# Patient Record
Sex: Female | Born: 1980 | Race: Black or African American | Hispanic: No | Marital: Single | Smoking: Current some day smoker
Health system: Southern US, Community
[De-identification: ages and names within clinical notes are randomized; demographics above are authoritative.]

## PROBLEM LIST (undated history)

## (undated) ENCOUNTER — Inpatient Hospital Stay (HOSPITAL_COMMUNITY): Payer: Self-pay

## (undated) DIAGNOSIS — L508 Other urticaria: Secondary | ICD-10-CM

## (undated) DIAGNOSIS — F329 Major depressive disorder, single episode, unspecified: Secondary | ICD-10-CM

## (undated) DIAGNOSIS — Z8632 Personal history of gestational diabetes: Secondary | ICD-10-CM

## (undated) DIAGNOSIS — E282 Polycystic ovarian syndrome: Secondary | ICD-10-CM

## (undated) DIAGNOSIS — F32A Depression, unspecified: Secondary | ICD-10-CM

## (undated) DIAGNOSIS — R7303 Prediabetes: Secondary | ICD-10-CM

## (undated) DIAGNOSIS — Z87898 Personal history of other specified conditions: Secondary | ICD-10-CM

## (undated) DIAGNOSIS — IMO0002 Reserved for concepts with insufficient information to code with codable children: Secondary | ICD-10-CM

## (undated) DIAGNOSIS — J3089 Other allergic rhinitis: Secondary | ICD-10-CM

## (undated) DIAGNOSIS — N970 Female infertility associated with anovulation: Secondary | ICD-10-CM

## (undated) DIAGNOSIS — Z8759 Personal history of other complications of pregnancy, childbirth and the puerperium: Secondary | ICD-10-CM

## (undated) DIAGNOSIS — O139 Gestational [pregnancy-induced] hypertension without significant proteinuria, unspecified trimester: Secondary | ICD-10-CM

## (undated) DIAGNOSIS — Z8742 Personal history of other diseases of the female genital tract: Secondary | ICD-10-CM

## (undated) HISTORY — DX: Polycystic ovarian syndrome: E28.2

## (undated) HISTORY — PX: WISDOM TOOTH EXTRACTION: SHX21

## (undated) HISTORY — PX: COLPOSCOPY: SHX161

## (undated) HISTORY — DX: Reserved for concepts with insufficient information to code with codable children: IMO0002

## (undated) HISTORY — DX: Female infertility associated with anovulation: N97.0

## (undated) HISTORY — DX: Other allergic rhinitis: J30.89

---
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
## 2000-08-09 ENCOUNTER — Emergency Department (HOSPITAL_COMMUNITY): Admission: EM | Admit: 2000-08-09 | Discharge: 2000-08-09 | Payer: Self-pay | Admitting: *Deleted

## 2001-08-01 ENCOUNTER — Emergency Department (HOSPITAL_COMMUNITY): Admission: EM | Admit: 2001-08-01 | Discharge: 2001-08-01 | Payer: Self-pay | Admitting: Emergency Medicine

## 2004-11-09 ENCOUNTER — Emergency Department (HOSPITAL_COMMUNITY): Admission: EM | Admit: 2004-11-09 | Discharge: 2004-11-09 | Payer: Self-pay | Admitting: Emergency Medicine

## 2005-02-21 ENCOUNTER — Emergency Department (HOSPITAL_COMMUNITY): Admission: EM | Admit: 2005-02-21 | Discharge: 2005-02-21 | Payer: Self-pay | Admitting: Emergency Medicine

## 2005-03-02 ENCOUNTER — Emergency Department (HOSPITAL_COMMUNITY): Admission: EM | Admit: 2005-03-02 | Discharge: 2005-03-02 | Payer: Self-pay | Admitting: Emergency Medicine

## 2005-07-11 ENCOUNTER — Emergency Department (HOSPITAL_COMMUNITY): Admission: EM | Admit: 2005-07-11 | Discharge: 2005-07-11 | Payer: Self-pay | Admitting: Emergency Medicine

## 2006-01-30 ENCOUNTER — Emergency Department (HOSPITAL_COMMUNITY): Admission: EM | Admit: 2006-01-30 | Discharge: 2006-01-30 | Payer: Self-pay | Admitting: Emergency Medicine

## 2006-05-19 ENCOUNTER — Emergency Department (HOSPITAL_COMMUNITY): Admission: EM | Admit: 2006-05-19 | Discharge: 2006-05-19 | Payer: Self-pay | Admitting: Emergency Medicine

## 2006-08-10 ENCOUNTER — Inpatient Hospital Stay (HOSPITAL_COMMUNITY): Admission: AD | Admit: 2006-08-10 | Discharge: 2006-08-10 | Payer: Self-pay | Admitting: Gynecology

## 2006-09-16 ENCOUNTER — Ambulatory Visit (HOSPITAL_COMMUNITY): Admission: RE | Admit: 2006-09-16 | Discharge: 2006-09-16 | Payer: Self-pay | Admitting: Obstetrics & Gynecology

## 2006-12-27 ENCOUNTER — Emergency Department (HOSPITAL_COMMUNITY): Admission: EM | Admit: 2006-12-27 | Discharge: 2006-12-27 | Payer: Self-pay | Admitting: Emergency Medicine

## 2008-01-09 ENCOUNTER — Emergency Department (HOSPITAL_COMMUNITY): Admission: EM | Admit: 2008-01-09 | Discharge: 2008-01-09 | Payer: Self-pay | Admitting: Family Medicine

## 2008-11-05 ENCOUNTER — Emergency Department (HOSPITAL_COMMUNITY): Admission: EM | Admit: 2008-11-05 | Discharge: 2008-11-05 | Payer: Self-pay | Admitting: Emergency Medicine

## 2008-11-16 ENCOUNTER — Ambulatory Visit (HOSPITAL_COMMUNITY): Admission: RE | Admit: 2008-11-16 | Discharge: 2008-11-16 | Payer: Self-pay | Admitting: Obstetrics & Gynecology

## 2009-04-14 ENCOUNTER — Emergency Department (HOSPITAL_COMMUNITY): Admission: EM | Admit: 2009-04-14 | Discharge: 2009-04-14 | Payer: Self-pay | Admitting: Emergency Medicine

## 2009-05-01 ENCOUNTER — Encounter: Admission: RE | Admit: 2009-05-01 | Discharge: 2009-05-01 | Payer: Self-pay | Admitting: Chiropractic Medicine

## 2009-07-06 DIAGNOSIS — R87619 Unspecified abnormal cytological findings in specimens from cervix uteri: Secondary | ICD-10-CM

## 2009-07-06 DIAGNOSIS — IMO0002 Reserved for concepts with insufficient information to code with codable children: Secondary | ICD-10-CM

## 2009-07-06 HISTORY — DX: Reserved for concepts with insufficient information to code with codable children: IMO0002

## 2009-07-06 HISTORY — DX: Unspecified abnormal cytological findings in specimens from cervix uteri: R87.619

## 2009-07-08 ENCOUNTER — Inpatient Hospital Stay (HOSPITAL_COMMUNITY): Admission: AD | Admit: 2009-07-08 | Discharge: 2009-07-08 | Payer: Self-pay | Admitting: Obstetrics & Gynecology

## 2010-07-17 ENCOUNTER — Ambulatory Visit: Admit: 2010-07-17 | Payer: Self-pay | Admitting: Obstetrics and Gynecology

## 2010-07-26 ENCOUNTER — Encounter: Payer: Self-pay | Admitting: Family Medicine

## 2010-07-27 ENCOUNTER — Encounter: Payer: Self-pay | Admitting: Obstetrics & Gynecology

## 2010-09-03 ENCOUNTER — Encounter: Payer: Self-pay | Admitting: Obstetrics & Gynecology

## 2010-09-03 ENCOUNTER — Other Ambulatory Visit: Payer: Self-pay | Admitting: Family Medicine

## 2010-09-03 ENCOUNTER — Encounter (INDEPENDENT_AMBULATORY_CARE_PROVIDER_SITE_OTHER): Payer: Medicaid Other | Admitting: Family Medicine

## 2010-09-03 DIAGNOSIS — E282 Polycystic ovarian syndrome: Secondary | ICD-10-CM

## 2010-09-03 LAB — CONVERTED CEMR LAB
FSH: 8.4 milliintl units/mL
Hgb A1c MFr Bld: 6.2 % — ABNORMAL HIGH (ref <5.7)
LH: 34.9 milliintl units/mL
Prolactin: 7.5 ng/mL
TSH: 0.699 microintl units/mL (ref 0.350–4.500)
Testosterone Free: 32.3 pg/mL — ABNORMAL HIGH (ref 0.6–6.8)
Testosterone: 180.66 ng/dL — ABNORMAL HIGH (ref 10–70)

## 2010-09-11 ENCOUNTER — Ambulatory Visit (HOSPITAL_COMMUNITY)
Admission: RE | Admit: 2010-09-11 | Discharge: 2010-09-11 | Disposition: A | Discharge status: Home / Self Care | Payer: Medicaid Other | Source: Ambulatory Visit | Attending: Obstetrics and Gynecology | Admitting: Obstetrics and Gynecology

## 2010-09-11 DIAGNOSIS — E282 Polycystic ovarian syndrome: Secondary | ICD-10-CM | POA: Insufficient documentation

## 2010-09-11 DIAGNOSIS — N912 Amenorrhea, unspecified: Secondary | ICD-10-CM | POA: Insufficient documentation

## 2010-09-18 ENCOUNTER — Ambulatory Visit: Payer: Self-pay | Admitting: Obstetrics and Gynecology

## 2010-09-21 LAB — URINALYSIS, ROUTINE W REFLEX MICROSCOPIC
Bilirubin Urine: NEGATIVE
Hgb urine dipstick: NEGATIVE
Ketones, ur: NEGATIVE mg/dL
Protein, ur: NEGATIVE mg/dL
Urobilinogen, UA: 0.2 mg/dL (ref 0.0–1.0)
pH: 8 (ref 5.0–8.0)

## 2010-09-21 LAB — WET PREP, GENITAL
Clue Cells Wet Prep HPF POC: NONE SEEN
Trich, Wet Prep: NONE SEEN
Yeast Wet Prep HPF POC: NONE SEEN

## 2010-09-21 LAB — GC/CHLAMYDIA PROBE AMP, GENITAL
Chlamydia, DNA Probe: NEGATIVE
GC Probe Amp, Genital: NEGATIVE

## 2010-09-26 NOTE — Progress Notes (Signed)
NAME:  Deanna Walker, Deanna Walker NO.:  [id number]  MEDICAL RECORD NO.:  [id number]           PATIENT TYPE:  A  LOCATION:  [facility]                   FACILITY:  [facility]  PHYSICIAN:  Lucina Mellow, DO   DATE OF BIRTH:  December 19, 1980  DATE OF SERVICE:  09/03/2010                                 CLINIC NOTE  The patient presents to the GYN Clinic on the afternoon of September 03, 2010.  The patient's chief complaint/reason for visit is she is referred from [facility] for probable ovarian cyst.  She reports irregular periods and gives a history that she has to be on pills to get her periods.  HISTORY OF PRESENT ILLNESS:  The patient is a 30 year old, gravida 0, who presents for regular periods and desiring pregnancy.  She stopped taking her birth control pills in October 2011 and has not had a period since and she is currently sexually active trying to get pregnant and does not use any condoms or other forms.  She states that she has a history of irregular periods since she was 17.  She has tried several birth control methods, including Depo, OCPs, and the patches and only has monthly periods while on birth control pills.  She notes she has also noticed an increase in her fatigue, facial hair, and weight gain for the past 3 years.  Last year, she had an abnormal Pap with colpo and biopsy, but since then had normal paps.  In October 2011, she had a normal Pap and negative gonorrhea and Chlamydia testing.  She has a very remote history of gonorrhea.  She has a history of 2 normal vaginal ultrasounds in 2011.  PAST MEDICAL HISTORY:  Menarche at age 11.  She has no medical problems. No history of surgeries.  SOCIAL HISTORY:  She admits to use of THC.  PHYSICAL EXAMINATION:  VITAL SIGNS:  The patient has a blood pressure 127/61, pulse of 76, temperature of 98.4, weight of 179.3, and height of 65 inches. GENERAL:  The patient is a pleasant-appearing African  American female who looks her stated age of 30 years old. HEART:  Regular rate and rhythm.  No audible murmurs. LUNGS:  Clear to auscultation bilaterally. NECK:  Thyroid is palpated, but not enlarged or asymmetric with no nodules. ABDOMEN:  Benign. PELVIC:  Deferred because the patient states she just had a routine pelvic exam that was completely normal in October 2011.  ASSESSMENT:  Probable polycystic ovarian syndrome.  I discussed with the patient that because of her facial hair and her irregular periods she needs to treat the true cause for polycystic ovarian syndrome.  I discussed with her that the reason why she may not been able to get pregnant because she is simply not ovulating due to the PCOS.  We will get lab testing today, FSH, LH, prolactin, TSH, hemoglobin A1c, and testosterone to determine if there is a hormonal abnormality that needs to be addressed as the reason for her disorder or her lack of fertility. She is not considered infertile until she has not been able to conceive for 12 months.  I discussed with her also that she  may need to discuss with her partner about ensuring that he is fertile.  The patient states that her partner has a 62 year old and an 40 year old.  We will also get an ultrasound to evaluate her uterus and ovaries to ensure that her anatomy is normal and that see there is any further evidence to support the diagnosis of PCOS.  The patient is to return to the clinic in approximately 2 weeks to discuss her lab results at that time. The patient agrees with this plan.  Questions were answered and she voices all understanding.          ______________________________ Lucina Mellow, DO    SH/MEDQ  D:  09/03/2010  T:  09/04/2010  Job:  [id number]

## 2010-10-02 ENCOUNTER — Ambulatory Visit (INDEPENDENT_AMBULATORY_CARE_PROVIDER_SITE_OTHER): Payer: Medicaid Other | Admitting: Obstetrics and Gynecology

## 2010-10-02 DIAGNOSIS — E282 Polycystic ovarian syndrome: Secondary | ICD-10-CM

## 2010-10-03 NOTE — Progress Notes (Unsigned)
NAME:  Deanna Walker, DELLER NO.:  [id number]  MEDICAL RECORD NO.:  [id number]           PATIENT TYPE:  A  [facility]ATION[facility]                   FACILITY:  [facility]  PHYSICIAN:  Argentina Donovan, MD        DATE OF BIRTH:  05/03/1981  DATE OF SERVICE:  10/02/2010                                 CLINIC NOTE  The patient is a 30 year old, nulligravid, African American female, with polycystic ovarian syndrome, who was last seen by Dr. Natale Milch.  She got lab tests on the patient.  The patient does have amenorrhea, hirsutism, had elevated free testosterone and elevated hemoglobin A1c.  We are going to start her on metformin to twice a day 500 mg with meals and Lo Loestrin 24 Fe on a regular basis.  We have talked about pregnancy.  We have talked about contraception.  We have talked about hair growth.  We have covered most of the things that involve the polycystic ovarian syndrome.  By the way, this patient also had multiple subcentimeter peripherally oriented follicles on her ovaries, so we are going to put her on this medication and we are going to have her come back in about 3 months and see how she is doing.  IMPRESSION:  Polycystic ovarian syndrome.  PLAN:  Treat with metformin and oral contraceptives.          ______________________________ Argentina Donovan, MD    PR/MEDQ  D:  10/02/2010  T:  10/03/2010  Job:  [id number]

## 2010-10-09 LAB — POCT PREGNANCY, URINE: Preg Test, Ur: NEGATIVE

## 2010-10-14 ENCOUNTER — Inpatient Hospital Stay (INDEPENDENT_AMBULATORY_CARE_PROVIDER_SITE_OTHER)
Admission: RE | Admit: 2010-10-14 | Discharge: 2010-10-14 | Disposition: A | Discharge status: Home / Self Care | Payer: Medicaid Other | Source: Ambulatory Visit | Attending: Family Medicine | Admitting: Family Medicine

## 2010-10-14 DIAGNOSIS — L0390 Cellulitis, unspecified: Secondary | ICD-10-CM

## 2010-10-14 LAB — URINALYSIS, ROUTINE W REFLEX MICROSCOPIC
Protein, ur: NEGATIVE mg/dL
Specific Gravity, Urine: 1.007 (ref 1.005–1.030)

## 2010-10-14 LAB — WET PREP, GENITAL
WBC, Wet Prep HPF POC: NONE SEEN
Yeast Wet Prep HPF POC: NONE SEEN

## 2010-10-14 LAB — GC/CHLAMYDIA PROBE AMP, GENITAL: Chlamydia, DNA Probe: NEGATIVE

## 2010-12-10 ENCOUNTER — Ambulatory Visit: Payer: Medicaid Other | Admitting: Obstetrics and Gynecology

## 2010-12-15 ENCOUNTER — Other Ambulatory Visit: Payer: Self-pay | Admitting: Obstetrics and Gynecology

## 2010-12-15 ENCOUNTER — Ambulatory Visit (INDEPENDENT_AMBULATORY_CARE_PROVIDER_SITE_OTHER): Payer: Medicaid Other | Admitting: Obstetrics and Gynecology

## 2010-12-15 DIAGNOSIS — E282 Polycystic ovarian syndrome: Secondary | ICD-10-CM

## 2010-12-15 LAB — POCT PREGNANCY, URINE: Preg Test, Ur: NEGATIVE

## 2010-12-16 NOTE — Group Therapy Note (Signed)
NAME:  Deanna Walker, Deanna Walker NO.:  [id number]  MEDICAL RECORD NO.:  [id number]           PATIENT TYPE:  A  LOCATION[facility]                   [facility]ACILITY:  [facility]  PHYSICIAN:  Argentina Donovan, MD        DATE OF BIRTH:  1981/06/15  DATE OF SERVICE:  12/15/2010                                 CLINIC NOTE  Follow-up visit on a patient, who is a 30 year old, nulligravida, African American female, with polycystic ovarian syndrome, who was on 500 mg of Glucophage b.i.d., is starting to have regular periods.  We will going to keep her on that another month if she does not have regular periods, I will put her on 1000 into the evening.  She does weigh 172 pounds at the present time, down from 179.  We started her on the Glucophage and does not appear overweight, but mainly belly fat. She does have some hirsutism, which she is trying to get pregnant, so she is not on birth control pills at the present time.  I think that she is doing the right thing right now and would discontinue on with a treatment as such.  I have told her to call us for an increase to 1000 of Glucophage with her evening meal if periods are not regular.          ______________________________ Argentina Donovan, MD    PR/MEDQ  D:  12/15/2010  T:  12/16/2010  Job:  [id number]

## 2011-01-12 ENCOUNTER — Telehealth: Payer: Self-pay

## 2011-01-12 NOTE — Telephone Encounter (Signed)
Pt stated that in her last visit she was told that she would get an increase in her Metformin.  Reading the visit note from 12/15/10 per Dr. Okey Dupre, if the pt did not receive regular periods then that is when she would get an increase to 1000mg  Metformin.  I asked pt if she was having regular periods and pt stated "yes, she is having periods once a month" so she would not need the increase of Metformin.  Pt stated understanding.

## 2011-02-11 ENCOUNTER — Encounter: Payer: Self-pay | Admitting: *Deleted

## 2011-02-11 DIAGNOSIS — E282 Polycystic ovarian syndrome: Secondary | ICD-10-CM | POA: Insufficient documentation

## 2011-02-12 ENCOUNTER — Ambulatory Visit: Payer: Medicaid Other | Admitting: Obstetrics and Gynecology

## 2011-02-24 ENCOUNTER — Telehealth: Payer: Self-pay | Admitting: *Deleted

## 2011-02-24 NOTE — Telephone Encounter (Signed)
Pt left message that she had questions for the doctor

## 2011-02-27 NOTE — Telephone Encounter (Signed)
Spoke w/pt. She stated that she has not had a period since her last clinic visit. She has started the metformin and took the birth control pills for the 2 wks that were recommended. I told pt I did not think she was only supposed to take birth control pills for 2 wks because that would not be a full cycle of hormone pills. Pt stated that was her understanding. Pt has next appt on 03/13/11 for follow up. I told pt that there is no harm in her not having a period every month and that because she has PCOS, this is why. It is important however to try to re-boot her menstrual cycle and she will need to discuss further @ the time of her visit. Pt voiced understanding.

## 2011-03-13 ENCOUNTER — Encounter: Payer: Self-pay | Admitting: Obstetrics and Gynecology

## 2011-03-13 ENCOUNTER — Ambulatory Visit (INDEPENDENT_AMBULATORY_CARE_PROVIDER_SITE_OTHER): Payer: Medicaid Other | Admitting: Obstetrics and Gynecology

## 2011-03-13 VITALS — BP 122/83 | HR 73 | Temp 98.4°F | Ht 65.0 in | Wt 176.5 lb

## 2011-03-13 DIAGNOSIS — N910 Primary amenorrhea: Secondary | ICD-10-CM

## 2011-03-13 DIAGNOSIS — N949 Unspecified condition associated with female genital organs and menstrual cycle: Secondary | ICD-10-CM

## 2011-03-13 DIAGNOSIS — N979 Female infertility, unspecified: Secondary | ICD-10-CM

## 2011-03-13 LAB — POCT URINALYSIS DIP (DEVICE)
Specific Gravity, Urine: 1.015 (ref 1.005–1.030)
pH: 8 (ref 5.0–8.0)

## 2011-03-13 LAB — POCT PREGNANCY, URINE: Preg Test, Ur: NEGATIVE

## 2011-03-13 MED ORDER — MEDROXYPROGESTERONE ACETATE 10 MG PO TABS: 10.0000 mg | ORAL_TABLET | Freq: Every day | ORAL | Status: DC

## 2011-03-16 NOTE — Progress Notes (Signed)
Date of visit is 03/13/2011  Chief complaint: Patient presents today complaining of no menses for 2 months.  History of present illness: Shortness today complaining of no menses x2 months. She states she has been trying to get pregnant. She denies abdominal pain, vaginal discharge, vaginal bleeding, or any other problems at this time.  Physical examination: Patient presents alert and oriented x3 in no apparent distress. Her urine pregnancy test was negative. On examination her abdomen is soft and nontender to palpation. Bowel sounds are present. She has normal external female genitalia including bulbourethral and Skene's glands. Bimanual examination reveals her uterus to be normal size and shape. There no adnexal masses.  Assessment and plan: Patient has a history of PCO S. She is likely anovulatory. I did give her prescription for Provera 10 mg to take 1 by mouth daily for 10 days. She will followup in one month. She is aware that she should have menses within about 2 weeks of taking the medication. She understood this and agreed.  Clinton Gallant. Onyinyechi Huante III, DrHSc, MPAS, PA-C

## 2011-04-02 LAB — POCT URINALYSIS DIP (DEVICE)
Bilirubin Urine: NEGATIVE
Hgb urine dipstick: NEGATIVE
Ketones, ur: NEGATIVE
Nitrite: NEGATIVE
Protein, ur: NEGATIVE
Specific Gravity, Urine: 1.02
Urobilinogen, UA: 0.2
pH: 6

## 2011-04-02 LAB — POCT PREGNANCY, URINE: Preg Test, Ur: NEGATIVE

## 2011-04-05 ENCOUNTER — Inpatient Hospital Stay (HOSPITAL_COMMUNITY)
Admission: AD | Admit: 2011-04-05 | Discharge: 2011-04-05 | Disposition: A | Discharge status: Home / Self Care | Payer: Medicaid Other | Source: Ambulatory Visit | Attending: Obstetrics & Gynecology | Admitting: Obstetrics & Gynecology

## 2011-04-05 ENCOUNTER — Encounter (HOSPITAL_COMMUNITY): Payer: Self-pay | Admitting: *Deleted

## 2011-04-05 DIAGNOSIS — R1013 Epigastric pain: Secondary | ICD-10-CM

## 2011-04-05 DIAGNOSIS — R109 Unspecified abdominal pain: Secondary | ICD-10-CM | POA: Insufficient documentation

## 2011-04-05 DIAGNOSIS — L02429 Furuncle of limb, unspecified: Secondary | ICD-10-CM | POA: Insufficient documentation

## 2011-04-05 HISTORY — DX: Depression, unspecified: F32.A

## 2011-04-05 HISTORY — DX: Major depressive disorder, single episode, unspecified: F32.9

## 2011-04-05 LAB — COMPREHENSIVE METABOLIC PANEL
ALT: 14 U/L (ref 0–35)
AST: 16 U/L (ref 0–37)
Albumin: 3.9 g/dL (ref 3.5–5.2)
Alkaline Phosphatase: 78 U/L (ref 39–117)
Chloride: 101 mEq/L (ref 96–112)
Potassium: 3.9 mEq/L (ref 3.5–5.1)
Sodium: 138 mEq/L (ref 135–145)
Total Protein: 8 g/dL (ref 6.0–8.3)

## 2011-04-05 LAB — URINALYSIS, ROUTINE W REFLEX MICROSCOPIC
Glucose, UA: NEGATIVE mg/dL
Ketones, ur: NEGATIVE mg/dL
Leukocytes, UA: NEGATIVE
pH: 6 (ref 5.0–8.0)

## 2011-04-05 LAB — URINE MICROSCOPIC-ADD ON

## 2011-04-05 LAB — CBC
MCHC: 33.1 g/dL (ref 30.0–36.0)
RDW: 13.9 % (ref 11.5–15.5)
WBC: 10.5 10*3/uL (ref 4.0–10.5)

## 2011-04-05 LAB — POCT PREGNANCY, URINE: Preg Test, Ur: NEGATIVE

## 2011-04-05 MED ORDER — IBUPROFEN 600 MG PO TABS
600.0000 mg | ORAL_TABLET | Freq: Once | ORAL | Status: AC
  Administered 2011-04-05: 600 mg via ORAL
  Filled 2011-04-05: qty 1

## 2011-04-05 MED ORDER — IBUPROFEN 600 MG PO TABS: 600.0000 mg | ORAL_TABLET | Freq: Four times a day (QID) | ORAL | Status: AC | PRN

## 2011-04-05 NOTE — ED Provider Notes (Signed)
History   The patient is a 30 y.o. year old G0P0 female at who presents to [facility] reporting: 1. Boil under her right arm 2. Long period   CSN: [id number] Arrival date & time: 04/05/2011  3:39 PM  Chief Complaint  Patient presents with  . Recurrent Skin Infections    abscess in R axilla  . Abdominal Pain    starts mid upper abd, radiates down through vagina    (Consider location/radiation/quality/duration/timing/severity/associated sxs/prior treatment) HPI  Past Medical History  Diagnosis Date  . Abnormal Pap smear 2011    colpo and biopsy done, normal paps since  . PCOS (polycystic ovarian syndrome)   . Boil   . Boil, axilla   . Boil of buttock   . Depression     Past Surgical History  Procedure Date  . Abcess drainage   . Colposcopy     Family History  Problem Relation Age of Onset  . Diabetes Maternal Aunt   . Hypertension Maternal Aunt     History  Substance Use Topics  . Smoking status: Never Smoker   . Smokeless tobacco: Never Used  . Alcohol Use: 0.6 oz/week    1 Glasses of wine per week    OB History    Grav Para Term Preterm Abortions TAB SAB Ect Mult Living   0 0              Review of Systems: Denies fever, chills, dizziness  Allergies  Review of patient's allergies indicates no known allergies.  Home Medications  No current outpatient prescriptions on file.  LMP 03/29/2011  Physical Exam:  2-3 cm firm, non-fluctuant mass in right axilla. No erythema.  Scant drainage of serosanguinous fluid.  Pelvic: Mod BRB. No clots or cervical polyps. Uterus difficult to assess due to body habitus  ED Course  Procedures (including critical care time) Results for Deanna, Walker (MRN [id number]) as of 04/10/2011 00:51  Ref. Range 04/05/2011 15:55 04/05/2011 15:58 04/05/2011 17:50  Glucose Latest Range: 70-99 mg/dL   90  Alkaline Phosphatase Latest Range: 39-117 U/L   78  Albumin Latest Range: 3.5-5.2 g/dL   3.9  AST Latest Range: 0-37 U/L   16  ALT  Latest Range: 0-35 U/L   14  Total Protein Latest Range: 6.0-8.3 g/dL   8.0  Total Bilirubin Latest Range: 0.3-1.2 mg/dL   0.2 (L)  WBC Latest Range: 4.0-10.5 K/uL   10.5  RBC Latest Range: 3.87-5.11 MIL/uL   4.84  HGB Latest Range: 12.0-15.0 g/dL   78.2  HCT Latest Range: 36.0-46.0 %   38.4  MCV Latest Range: 78.0-100.0 fL   79.3  MCH Latest Range: 26.0-34.0 pg   26.2  MCHC Latest Range: 30.0-36.0 g/dL   95.6  RDW Latest Range: 11.5-15.5 %   13.9  Platelets Latest Range: 150-400 K/uL   362  Preg Test, Ur No range found  NEGATIVE   Color, Urine Latest Range: YELLOW  YELLOW    Appearance Latest Range: CLEAR  CLEAR    Specific Gravity, Urine Latest Range: 1.005-1.030  1.010    pH Latest Range: 5.0-8.0  6.0    Glucose, UA Latest Range: NEGATIVE mg/dL NEGATIVE    Bilirubin Urine Latest Range: NEGATIVE  NEGATIVE    Ketones, ur Latest Range: NEGATIVE mg/dL NEGATIVE    Protein Latest Range: NEGATIVE mg/dL NEGATIVE    Urobilinogen, UA Latest Range: 0.0-1.0 mg/dL 0.2    Nitrite Latest Range: NEGATIVE  NEGATIVE    Leukocytes, UA Latest  Range: NEGATIVE  NEGATIVE    RBC / HPF Latest Range: <3 RBC/hpf 3-6    Squamous Epithelial / LPF Latest Range: RARE  RARE    Bacteria, UA Latest Range: RARE  RARE    Hgb urine dipstick Latest Range: NEGATIVE  LARGE (A)     Assessment: 1. Inclusion cyst 2. Irreg menses due to PCOS and recent use of Provera to initiate withdrawal bleed.    Plan: 1. Soaks to right axilla 5x/day 2. Rx Ibuprofen 600 mg PO Q6 x 48 hours 3. F/U AS at [facility] G[facility] clinic AS 04/15/11 or PRN for soaking >1pad per hour or dizziness.  Dorathy Kinsman 04/05/2011 20:04 PM

## 2011-04-05 NOTE — Progress Notes (Signed)
Pt signed in with c/o axillary abscess.  Upon initial assessment, pt did not mention abscess but states concern over irreg periods and period longer than normal at this time.  Has been f/u at [facility], was placed on Metformin 2-3 months ago, and Provera to initiate period a few weeks ago.  Period started 9/23, but states is bleeding heavier and longer than she thinks she should be.  States concern over abd pain that has occurred intermittently, clinic told pt it was "nothing" but pt feels something is wrong.  Pain begins with a "wriggling feeling" in upper abd that tells her the pain is coming, and then it radiates down through vagina and lasts up to a minute.  Does not seem to be aggravated by food intake or constipation, states has BM daily.

## 2011-04-05 NOTE — Progress Notes (Signed)
Boil under right armpit x 3 days more swollen and painful today, right eye jerking.

## 2011-04-15 NOTE — ED Provider Notes (Signed)
Agree with above note.  Deanna Walker H. 04/15/2011 9:35 PM

## 2011-04-22 LAB — CBC
HCT: 40.9
Hemoglobin: 13.3
MCV: 79.6
RDW: 14.2 — ABNORMAL HIGH

## 2011-04-22 LAB — BASIC METABOLIC PANEL
CO2: 28
Chloride: 101
GFR calc non Af Amer: >60
Glucose, Bld: 80
Potassium: 3.8
Sodium: 138

## 2011-04-22 LAB — DIFFERENTIAL
Basophils Absolute: 0
Eosinophils Absolute: 0.1
Eosinophils Relative: 1
Lymphocytes Relative: 31
Monocytes Absolute: 0.6

## 2011-04-24 ENCOUNTER — Ambulatory Visit (INDEPENDENT_AMBULATORY_CARE_PROVIDER_SITE_OTHER): Payer: Medicaid Other | Admitting: Obstetrics and Gynecology

## 2011-04-24 VITALS — BP 118/69 | HR 72 | Temp 97.2°F | Wt 178.1 lb

## 2011-04-24 DIAGNOSIS — E282 Polycystic ovarian syndrome: Secondary | ICD-10-CM

## 2011-04-24 MED ORDER — MEDROXYPROGESTERONE ACETATE 10 MG PO TABS: 10.0000 mg | ORAL_TABLET | Freq: Every day | ORAL | Status: DC

## 2011-04-24 MED ORDER — METFORMIN HCL 500 MG PO TABS: 1000.0000 mg | ORAL_TABLET | Freq: Two times a day (BID) | ORAL | Status: DC

## 2011-04-24 NOTE — Progress Notes (Signed)
S: Pt presents today for f/u of delayed menses. She was given an Rx for provera. She states she did have withdrawal bleeding that began on 9/23 and lasted for about 8 days. She denies abd pain, vag bleeding, dc, fever, or any other problems at this time. She states she desires pregnancy. She is currently taking metformin 500mg  bid.  O: VSS afebrile. A&Ox3 in NAD Abd soft, nontender. A/P: PCOS: discussed with pt at length. Will increase her metformin to 1000mg  bid. She will also be given another Rx for provera to begin on 05/07/11 if she has not had menses by that time. She will return in 2 months for evaluation. Discussed diet, activity, risks, and precautions.  Clinton Gallant. Rice III, DrHSc, MPAS, PA-C

## 2011-05-12 ENCOUNTER — Telehealth: Payer: Self-pay | Admitting: *Deleted

## 2011-05-12 MED ORDER — METFORMIN HCL 850 MG PO TABS: 850.0000 mg | ORAL_TABLET | Freq: Two times a day (BID) | ORAL | Status: DC

## 2011-05-12 NOTE — Telephone Encounter (Signed)
Pt did not give the reason for her call.

## 2011-05-12 NOTE — Telephone Encounter (Signed)
Pt left new message @ 1110 stating that she had lost her Rx and would like a call back. I called pt and told her that I can call in the meds per the original order. I received a flag on the metformin dosage entered by Madaline Guthrie on 10/19 and then called to discuss w/Dr. Macon Large. She changed the dose to metformin 850 mg po BID. Pt was informed and voiced understanding. Meds called to pt's pharmacy.

## 2011-06-11 ENCOUNTER — Emergency Department (HOSPITAL_COMMUNITY)
Admission: EM | Admit: 2011-06-11 | Discharge: 2011-06-11 | Disposition: A | Discharge status: Home / Self Care | Payer: Medicaid Other | Attending: Emergency Medicine | Admitting: Emergency Medicine

## 2011-06-11 ENCOUNTER — Encounter (HOSPITAL_COMMUNITY): Payer: Self-pay

## 2011-06-11 DIAGNOSIS — Z79899 Other long term (current) drug therapy: Secondary | ICD-10-CM | POA: Insufficient documentation

## 2011-06-11 DIAGNOSIS — E119 Type 2 diabetes mellitus without complications: Secondary | ICD-10-CM | POA: Insufficient documentation

## 2011-06-11 DIAGNOSIS — F3289 Other specified depressive episodes: Secondary | ICD-10-CM | POA: Insufficient documentation

## 2011-06-11 DIAGNOSIS — F329 Major depressive disorder, single episode, unspecified: Secondary | ICD-10-CM | POA: Insufficient documentation

## 2011-06-11 DIAGNOSIS — L301 Dyshidrosis [pompholyx]: Secondary | ICD-10-CM | POA: Insufficient documentation

## 2011-06-11 DIAGNOSIS — L299 Pruritus, unspecified: Secondary | ICD-10-CM | POA: Insufficient documentation

## 2011-06-11 MED ORDER — TRIAMCINOLONE ACETONIDE 0.1 % EX CREA: TOPICAL_CREAM | Freq: Two times a day (BID) | CUTANEOUS | Status: DC

## 2011-06-11 NOTE — ED Provider Notes (Signed)
History     CSN: [id number] Arrival date & time: 06/11/2011  3:43 PM   None     Chief Complaint  Patient presents with  . Rash    (Consider location/radiation/quality/duration/timing/severity/associated sxs/prior treatment) HPI  Patient presents to ER complaining of a 2 day hx of small itchy bumps on the tops of her hands and feet. Symptoms were gradual onset and persistent. Denies rash elsewhere and denies family members having similar rash. States itching but denies redness, drainage or swelling of skin. Denies new skin products or detergents. Denies aggravating or alleviating factors. Has not taken anything for itch before arrival.   Past Medical History  Diagnosis Date  . Abnormal Pap smear 2011    colpo and biopsy done, normal paps since  . PCOS (polycystic ovarian syndrome)   . Boil   . Boil, axilla   . Boil of buttock   . Depression   . Diabetes mellitus     Past Surgical History  Procedure Date  . Abcess drainage   . Colposcopy     Family History  Problem Relation Age of Onset  . Diabetes Maternal Aunt   . Hypertension Maternal Aunt     History  Substance Use Topics  . Smoking status: Never Smoker   . Smokeless tobacco: Never Used  . Alcohol Use: 0.6 oz/week    1 Glasses of wine per week    OB History    Grav Para Term Preterm Abortions TAB SAB Ect Mult Living   0 0              Review of Systems  All other systems reviewed and are negative.    Allergies  Review of patient's allergies indicates no known allergies.  Home Medications   Current Outpatient Rx  Name Route Sig Dispense Refill  . METFORMIN HCL 850 MG PO TABS Oral Take 1 tablet (850 mg total) by mouth 2 (two) times daily with a meal. 60 tablet 3  . THERA M PLUS PO TABS Oral Take 1 tablet by mouth daily.      Marland Kitchen MEDROXYPROGESTERONE ACETATE 10 MG PO TABS Oral Take 1 tablet (10 mg total) by mouth daily. 10 tablet 3  . TRIAMCINOLONE ACETONIDE 0.1 % EX CREA Topical Apply topically 2  (two) times daily. 30 g 0    BP 112/70  Pulse 82  Temp(Src) 98.1 F (36.7 C) (Oral)  Resp 16  SpO2 100%  LMP 06/06/2011  Physical Exam  Nursing note and vitals reviewed. Constitutional: She appears well-developed and well-nourished.  HENT:  Head: Normocephalic and atraumatic.  Eyes: Conjunctivae are normal.  Neck: Neck supple.  Cardiovascular: Normal rate.   Pulmonary/Chest: Effort normal.  Musculoskeletal: Normal range of motion.  Skin: Skin is warm and dry.       Pin point scattered skin colored papules on dorsal hands and feet. No erythema or drainage. No fluctuance.     ED Course  Procedures (including critical care time)  Labs Reviewed - No data to display No results found.   1. Dyshidrotic eczema       MDM  No signs or symptoms of cellulitis. Rash not consistent with scabies without tracking and rash only on hands and feet.         Jenness Corner, PA 06/11/11 1721  Jenness Corner, [place] 06/11/11 2131

## 2011-06-11 NOTE — ED Notes (Signed)
Pt states that she began noticing bumps on her hands/arms/feet about 2 days ago.  States they are itchy and painful.

## 2011-06-12 NOTE — ED Provider Notes (Signed)
Medical screening examination/treatment/procedure(s) were performed by non-physician practitioner and as supervising physician I was immediately available for consultation/collaboration.  Maribelle Hopple, MD 06/12/11 0204 

## 2011-06-26 ENCOUNTER — Ambulatory Visit: Payer: Medicaid Other | Admitting: Obstetrics and Gynecology

## 2011-08-07 ENCOUNTER — Encounter: Payer: Self-pay | Admitting: Obstetrics and Gynecology

## 2011-08-07 ENCOUNTER — Ambulatory Visit (INDEPENDENT_AMBULATORY_CARE_PROVIDER_SITE_OTHER): Payer: Medicaid Other | Admitting: Obstetrics and Gynecology

## 2011-08-07 ENCOUNTER — Other Ambulatory Visit: Payer: Self-pay | Admitting: Obstetrics and Gynecology

## 2011-08-07 VITALS — BP 120/70 | HR 86 | Temp 98.1°F | Ht 65.0 in | Wt 179.2 lb

## 2011-08-07 DIAGNOSIS — N949 Unspecified condition associated with female genital organs and menstrual cycle: Secondary | ICD-10-CM

## 2011-08-07 NOTE — Progress Notes (Signed)
S: Pt presents today to f/u on PCOS. She states she has had menses the past several months. On her last visit, her metformin dosage was increased to 850mg  twice daily. She states she did have a short menses in December and January. Her only concern now is that she continues to have intermittent sharp abd pain. She is unable to relate the pain to a certain time period or activities. She denies any other sx at this time. O: VSS A&O x 3 in NAD Abd soft, non-tender NL external genitalia. Bimanual exam reveals uterus to be NL size and shape with no obvious adnexal masses. Pt non-tender on exam. A/P: PCOS/pelvic pain: pt seems to be having NL menses on the current dose of metformin so she will continue. Will get [place] to r/o other etiology for her pain. Discussed diet, activity, risks, and precautions. She will f/u prn. Also recommended that she schedule an appt for yearly pap and pe.  Clinton Gallant. Nero Sawatzky III, DrHSc, MPAS, PA-C

## 2011-08-11 ENCOUNTER — Ambulatory Visit (HOSPITAL_COMMUNITY): Payer: Medicaid Other

## 2011-08-13 ENCOUNTER — Ambulatory Visit (HOSPITAL_COMMUNITY)
Admission: RE | Admit: 2011-08-13 | Discharge: 2011-08-13 | Disposition: A | Discharge status: Home / Self Care | Payer: Medicaid Other | Source: Ambulatory Visit | Attending: Obstetrics and Gynecology | Admitting: Obstetrics and Gynecology

## 2011-08-13 DIAGNOSIS — E282 Polycystic ovarian syndrome: Secondary | ICD-10-CM | POA: Insufficient documentation

## 2011-08-13 DIAGNOSIS — N949 Unspecified condition associated with female genital organs and menstrual cycle: Secondary | ICD-10-CM | POA: Insufficient documentation

## 2011-09-09 ENCOUNTER — Encounter: Payer: Self-pay | Admitting: Obstetrics & Gynecology

## 2011-09-09 ENCOUNTER — Ambulatory Visit (INDEPENDENT_AMBULATORY_CARE_PROVIDER_SITE_OTHER): Payer: Medicaid Other | Admitting: Obstetrics & Gynecology

## 2011-09-09 VITALS — BP 129/77 | HR 83 | Temp 97.4°F | Ht 65.5 in | Wt 179.9 lb

## 2011-09-09 DIAGNOSIS — R351 Nocturia: Secondary | ICD-10-CM

## 2011-09-09 DIAGNOSIS — N949 Unspecified condition associated with female genital organs and menstrual cycle: Secondary | ICD-10-CM

## 2011-09-09 DIAGNOSIS — R102 Pelvic and perineal pain: Secondary | ICD-10-CM

## 2011-09-09 LAB — POCT URINALYSIS DIP (DEVICE)
Bilirubin Urine: NEGATIVE
Glucose, UA: NEGATIVE mg/dL
Leukocytes, UA: NEGATIVE
Nitrite: NEGATIVE

## 2011-09-09 NOTE — Progress Notes (Signed)
  Subjective:    Patient ID: Deanna Walker, female    DOB: February 27, 1981, 31 y.o.   MRN: [id number]  HPI  Deanna Walker is here today for her sypmtom of vaginal pain that "shoots up" into her pelvis. There are no provocating events. She denies dysparunia. She does have a distant history of gc and chlamydia. Her pelvic ultrasound done recently was normal with the exception of polycystic ovaries. She also complains of getting up at least 4-5 times every night to pee.   Review of Systems She drinks 1-2 liters of soda each day.    Objective:   Physical Exam        Assessment & Plan:  Pelvic pain/nocturia- I suspect interstitial cystitis. I am checking UA, U C&S, and GC and Chlamydia. I have told her NO MORE soda or caffeine. She will come back in 1 month for follow up.

## 2011-09-10 LAB — URINALYSIS
Bilirubin Urine: NEGATIVE
Glucose, UA: NEGATIVE mg/dL
Hgb urine dipstick: NEGATIVE
Ketones, ur: NEGATIVE mg/dL
Leukocytes, UA: NEGATIVE
Nitrite: NEGATIVE
Protein, ur: NEGATIVE mg/dL
Specific Gravity, Urine: 1.016 (ref 1.005–1.030)
Urobilinogen, UA: 0.2 mg/dL (ref 0.0–1.0)
pH: 7 (ref 5.0–8.0)

## 2011-09-10 LAB — GC/CHLAMYDIA PROBE AMP, URINE: Chlamydia, Swab/Urine, PCR: NEGATIVE

## 2011-10-14 ENCOUNTER — Ambulatory Visit: Payer: Medicaid Other | Admitting: Advanced Practice Midwife

## 2011-10-15 ENCOUNTER — Encounter: Payer: Self-pay | Admitting: Obstetrics & Gynecology

## 2011-10-15 ENCOUNTER — Ambulatory Visit (INDEPENDENT_AMBULATORY_CARE_PROVIDER_SITE_OTHER): Payer: Medicaid Other | Admitting: Obstetrics & Gynecology

## 2011-10-15 VITALS — BP 124/76 | HR 76 | Temp 97.1°F | Ht 65.0 in | Wt 177.4 lb

## 2011-10-15 DIAGNOSIS — R102 Pelvic and perineal pain: Secondary | ICD-10-CM

## 2011-10-15 DIAGNOSIS — N949 Unspecified condition associated with female genital organs and menstrual cycle: Secondary | ICD-10-CM

## 2011-10-15 NOTE — Progress Notes (Signed)
Mrs. Snoke is here for follow up of her labs as well as her continued "shooting" vaginal/pelvic pain. Again, no palliation or provocation. Her u/s is normal as are all of her labs.  Because I can find no gyn etiology for her symptoms, I have suggested that perhaps this is a pain that she can learn to live with. She is not content with that answer and so I am referring her to a urologist.

## 2011-10-15 NOTE — Progress Notes (Signed)
Referral to urologist made with patient. [facility] on 11/27/11 @ 1430. Patient agrees.

## 2011-10-15 NOTE — Progress Notes (Signed)
Pt c/o Metformin making her very tired and has to lay down.

## 2012-02-12 ENCOUNTER — Encounter (HOSPITAL_COMMUNITY): Payer: Self-pay | Admitting: *Deleted

## 2012-02-12 ENCOUNTER — Encounter (HOSPITAL_COMMUNITY): Payer: Self-pay | Admitting: Emergency Medicine

## 2012-02-12 ENCOUNTER — Emergency Department (HOSPITAL_COMMUNITY)
Admission: EM | Admit: 2012-02-12 | Discharge: 2012-02-12 | Disposition: A | Discharge status: Home / Self Care | Payer: Medicaid Other | Source: Home / Self Care | Attending: Emergency Medicine | Admitting: Emergency Medicine

## 2012-02-12 ENCOUNTER — Emergency Department (HOSPITAL_COMMUNITY)
Admission: EM | Admit: 2012-02-12 | Discharge: 2012-02-12 | Discharge status: Against Medical Advice | Payer: Medicaid Other | Attending: Emergency Medicine | Admitting: Emergency Medicine

## 2012-02-12 DIAGNOSIS — R5383 Other fatigue: Secondary | ICD-10-CM

## 2012-02-12 DIAGNOSIS — H538 Other visual disturbances: Secondary | ICD-10-CM | POA: Insufficient documentation

## 2012-02-12 DIAGNOSIS — R110 Nausea: Secondary | ICD-10-CM | POA: Insufficient documentation

## 2012-02-12 DIAGNOSIS — R5381 Other malaise: Secondary | ICD-10-CM

## 2012-02-12 DIAGNOSIS — R51 Headache: Secondary | ICD-10-CM

## 2012-02-12 DIAGNOSIS — R109 Unspecified abdominal pain: Secondary | ICD-10-CM

## 2012-02-12 LAB — POCT URINALYSIS DIP (DEVICE)
Bilirubin Urine: NEGATIVE
Glucose, UA: NEGATIVE mg/dL
Hgb urine dipstick: NEGATIVE
Ketones, ur: NEGATIVE mg/dL
Nitrite: NEGATIVE
Specific Gravity, Urine: 1.01 (ref 1.005–1.030)
pH: 6 (ref 5.0–8.0)

## 2012-02-12 LAB — POCT I-STAT, CHEM 8
BUN: 8 mg/dL (ref 6–23)
Chloride: 100 mEq/L (ref 96–112)
Creatinine, Ser: 0.9 mg/dL (ref 0.50–1.10)
Hemoglobin: 15.6 g/dL — ABNORMAL HIGH (ref 12.0–15.0)
Potassium: 3.9 mEq/L (ref 3.5–5.1)
Sodium: 140 mEq/L (ref 135–145)

## 2012-02-12 MED ORDER — FREESTYLE LITE DEVI: Status: DC

## 2012-02-12 MED ORDER — GLUCOSE BLOOD VI STRP: ORAL_STRIP | Status: DC

## 2012-02-12 NOTE — ED Notes (Signed)
Pt  Reports  Symptoms of  Headache   And  Blurred  Vision    X  3  Days       Pt  Seeing  Spots  At times  -  She  Also  Reports  Some  Nausea       She     Reports  Symptoms  Not  releived  By  otc  Tylenol      Pt  Reports  Taking  Metformin  For   Pre-diabetic  Problems      She  States        Pt  Also    Reports     She  Has  Spot on cervix  Which  She  Is seeing  [facility]om[facility]     hosp    About    She  denys  Any  Sinus  Symptoms  Or  Known  Injury     She  Was  At  Er  Earlier   Today  And  Left AMA

## 2012-02-12 NOTE — ED Provider Notes (Signed)
Chief Complaint  Patient presents with  . Headache    History of Present Illness:   Deanna Walker is a 31 year old female who comes in same with multiple complaints: Blurred vision, abdominal pain, headaches, fatigue, and malaise.  She's had trouble with her vision for the last month. This involves mostly the right eye but some in the left eye as well. The symptoms are constant. She notes spots in her peripheral vision which she sometimes thinks look like or insects or bugs. This is very annoying to her. She denies any eye pain. She does not wear contacts or glasses. She denies any redness of the eye. Sometimes she has some mucous drainage. She denies any diplopia. She has had some twitching of her right upper eyelid.  She's also had a 6 month history of recurring abdominal pain. Episodes occur randomly without any obvious precipitating factors. The pain begins in the suprapubic area and radiates up towards the xiphoid. It lasts for a second or 2 at a time. It's a sharp pain. She denies any nausea, vomiting, anorexia, constipation, diarrhea, or urinary symptoms. She has polycystic ovarian syndrome. Her menses have been irregular. She has seen a gynecologist and has been prescribed metformin. She also has hirsutism.  She also complains today of a three-day history of bifrontal headaches. These are steady and a dull ache. They're intermittent and better with Tylenol. They're not associated with photophobia or phonophobia. She denies any neurological symptoms. She denies any history of migraine headaches. She states she has been under some stress and was not resting well at night.  She's also concerned about the possibility of diabetes. She has frequent urination, dry mouth, nocturia, and polydipsia. She hasn't been checking her blood sugars. She was told that she had prediabetes.  She also feels tired and rundown. This is been going on for months. She doesn't have much energy. She states she never sleeps very  well at night. She gets up frequently to urinate and can't get back to sleep. Her boyfriend told her that she does snore loudly. She feels tired when she first gets up in the morning and has to fight sleep in the daytime. She states she's felt stressed out and sometimes mildly depressed and anxious. She denies any severe depressive symptoms. She hasn't been checked for hypothyroidism recently. She's never had a sleep apnea study.  Other symptoms including nausea and lower back pain. She denies fever, chills, stiff neck, sore throat, nasal congestion, coughing, wheezing, shortness of breath, or chest pain. Her weight has been stable. She denies constipation, diarrhea, blood in the stool. She's had no dysuria, urgency, or hematuria. She denies any vaginal discharge or itching.  She has a regular menses and takes progesterone for that. Her last menstrual period began July 31. She is sexually active without use of birth control or condoms.  Review of Systems:  Other than noted above, the patient denies any of the following symptoms. Systemic:  No fever, chills, sweats, fatigue, myalgias, headache, or anorexia. Eye:  No redness, pain or drainage. ENT:  No earache, nasal congestion, rhinorrhea, sinus pressure, or sore throat. Lungs:  No cough, sputum production, wheezing, shortness of breath.  Cardiovascular:  No chest pain, palpitations, or syncope. GI:  No nausea, vomiting, abdominal pain or diarrhea. GU:  No dysuria, frequency, or hematuria. Skin:  No rash or pruritis.  PMFSH:  Past medical history, family history, social history, meds, and allergies were reviewed.   Physical Exam:   Vital signs:  BP 119/67  Pulse 68  Temp 97.7 F (36.5 C) (Oral)  Resp 18  SpO2 99% General:  Alert, in no distress. Eye:  PERRL, full EOMs.  Lids and conjunctivas were normal. Fundi were benign. Visual fields were full to confrontation. ENT:  TMs and canals were normal, without erythema or inflammation.  Nasal  mucosa was clear and uncongested, without drainage.  Mucous membranes were moist.  Pharynx was clear, without exudate or drainage.  There were no oral ulcerations or lesions. Neck:  Supple, no adenopathy, tenderness or mass. Thyroid was normal. Lungs:  No respiratory distress.  Lungs were clear to auscultation, without wheezes, rales or rhonchi.  Breath sounds were clear and equal bilaterally. Heart:  Regular rhythm, without gallops, murmers or rubs. Abdomen:  Soft, flat, and non-tender to palpation.  No hepatosplenomagaly or mass. Extremities: No edema, pulses were full. Neurological exam:  Neurological examination: The patient is alert and oriented x3. Speech is clear, fluent, and appropriate. Cranial nerves are intact. There is no pronator drift and finger to nose was normal. Muscle strength, sensation, and DTRs are normal. Babinskis are downgoing. Station and gait were normal. Romberg sign is negative, patient is able to perform tandem gait well. Skin:  Clear, warm, and dry, without rash or lesions. She has facial hirsutism.  Labs:   Results for orders placed during the hospital encounter of 02/12/12  POCT URINALYSIS DIP (DEVICE)      Component Value Range   Glucose, UA NEGATIVE  NEGATIVE mg/dL   Bilirubin Urine NEGATIVE  NEGATIVE   Ketones, ur NEGATIVE  NEGATIVE mg/dL   Specific Gravity, Urine 1.010  1.005 - 1.030   Hgb urine dipstick NEGATIVE  NEGATIVE   pH 6.0  5.0 - 8.0   Protein, ur NEGATIVE  NEGATIVE mg/dL   Urobilinogen, UA 0.2  0.0 - 1.0 mg/dL   Nitrite NEGATIVE  NEGATIVE   Leukocytes, UA NEGATIVE  NEGATIVE  POCT PREGNANCY, URINE      Component Value Range   Preg Test, Ur NEGATIVE  NEGATIVE  POCT I-STAT, CHEM 8      Component Value Range   Sodium 140  135 - 145 mEq/L   Potassium 3.9  3.5 - 5.1 mEq/L   Chloride 100  96 - 112 mEq/L   BUN 8  6 - 23 mg/dL   Creatinine, Ser 1.61  0.50 - 1.10 mg/dL   Glucose, Bld 80  70 - 99 mg/dL   Calcium, Ion 0.96 (*) 1.12 - 1.23 mmol/L    TCO2 28  0 - 100 mmol/L   Hemoglobin 15.6 (*) 12.0 - 15.0 g/dL   HCT 04.5  40.9 - 81.1 %     Assessment:  The primary encounter diagnosis was Blurred vision. Diagnoses of Abdominal pain, Headache, and Fatigue were also pertinent to this visit. Her eye exam is completely normal and her vision is normal today. I'm not sure what the visual symptoms are. These may be floaters but she needs further evaluation by an ophthalmologist and she was given the name of Dr. Stephannie Li. I think her abdominal pain may be related to her polycystic ovarian syndrome or may just be irritable bowel syndrome. Her headaches appear to be tension type headaches. There is no evidence for diabetes. I think her in followup in tiredness and fatigue may be due to stress, mild depression, sleep apnea, or possibly to hypothyroidism. I think she needs further workup by her primary care physician suggested a sleep apnea study and thyroid studies.  Plan:  1.  The following meds were prescribed:   New Prescriptions   BLOOD GLUCOSE MONITORING SUPPL (FREESTYLE LITE) DEVI    Test glucose daily.   GLUCOSE BLOOD (FREESTYLE LITE) TEST STRIP    Use as instructed   2.  The patient was instructed in symptomatic care and handouts were given. 3.  The patient was told to return if becoming worse in any way, if no better in 3 or 4 days, and given some red flag symptoms that would indicate earlier return.  Follow up:  The patient was told to follow up with Dr. Stephannie Li for visual symptoms and with her primary care physician for her other symptoms.       Reuben Likes, MD 02/12/12 2015

## 2012-02-12 NOTE — ED Notes (Signed)
Pt c/o frontal HA with nausea and blurry vision x 3 days

## 2012-06-30 ENCOUNTER — Encounter (HOSPITAL_COMMUNITY): Payer: Self-pay | Admitting: *Deleted

## 2012-06-30 ENCOUNTER — Other Ambulatory Visit (HOSPITAL_COMMUNITY)
Admission: RE | Admit: 2012-06-30 | Discharge: 2012-06-30 | Disposition: A | Discharge status: Home / Self Care | Payer: Medicaid Other | Source: Ambulatory Visit | Attending: Family Medicine | Admitting: Family Medicine

## 2012-06-30 ENCOUNTER — Emergency Department (INDEPENDENT_AMBULATORY_CARE_PROVIDER_SITE_OTHER)
Admission: EM | Admit: 2012-06-30 | Discharge: 2012-06-30 | Disposition: A | Discharge status: Home / Self Care | Payer: Medicaid Other | Source: Home / Self Care | Attending: Family Medicine | Admitting: Family Medicine

## 2012-06-30 DIAGNOSIS — F419 Anxiety disorder, unspecified: Secondary | ICD-10-CM

## 2012-06-30 DIAGNOSIS — G8929 Other chronic pain: Secondary | ICD-10-CM

## 2012-06-30 DIAGNOSIS — F411 Generalized anxiety disorder: Secondary | ICD-10-CM

## 2012-06-30 DIAGNOSIS — N760 Acute vaginitis: Secondary | ICD-10-CM | POA: Insufficient documentation

## 2012-06-30 DIAGNOSIS — N898 Other specified noninflammatory disorders of vagina: Secondary | ICD-10-CM

## 2012-06-30 DIAGNOSIS — N949 Unspecified condition associated with female genital organs and menstrual cycle: Secondary | ICD-10-CM

## 2012-06-30 DIAGNOSIS — Z113 Encounter for screening for infections with a predominantly sexual mode of transmission: Secondary | ICD-10-CM | POA: Insufficient documentation

## 2012-06-30 LAB — POCT URINALYSIS DIP (DEVICE)
Bilirubin Urine: NEGATIVE
Ketones, ur: NEGATIVE mg/dL
Leukocytes, UA: NEGATIVE
Specific Gravity, Urine: 1.02 (ref 1.005–1.030)
pH: 6 (ref 5.0–8.0)

## 2012-06-30 MED ORDER — HYDROXYZINE HCL 25 MG PO TABS: 25.0000 mg | ORAL_TABLET | Freq: Three times a day (TID) | ORAL | Status: DC | PRN

## 2012-06-30 MED ORDER — TRAMADOL HCL 50 MG PO TABS: 50.0000 mg | ORAL_TABLET | Freq: Three times a day (TID) | ORAL | Status: DC | PRN

## 2012-06-30 NOTE — ED Notes (Signed)
C/O intermittent low abd pains that occur with nausea x 2 days.  States she doubles over in pain at times, and also has loose stools every time she eats.  Has also had vaginal discharge x 2 wks.  Irregular menstrual cycles due to PCOS - ran out of metformin 2 months ago.  Felt like she had low-grade fever few days ago, but nothing since.

## 2012-06-30 NOTE — ED Provider Notes (Signed)
History     CSN: [id number]  Arrival date & time 06/30/12  1512   First MD Initiated Contact with Patient 06/30/12 1653      Chief Complaint  Patient presents with  . Abdominal Pain  . Vaginal Discharge    (Consider location/radiation/quality/duration/timing/severity/associated sxs/prior treatment) HPI Comments: 31 year old female with history of polycystic ovaries and chronic pelvic pain. Here complaining of "shooting" low abdominal pain associated with vaginal discharge for 2 days. Patient denies nausea vomiting or diarrhea. Patient states she's been very stressed as she wants to get pregnant and her menstrual cycles are not regular due to her polycystic ovaries. She has had similar symptoms in the past and has had pelvic ultrasounds more than once showing only polycystic ovaries. She denies any dysuria, hematuria or urinary frequency. Patient states that she stopped taking metformin due to side effects. She was seen here 4 months ago with multiple symptoms and was given a prescription for a blood sugar monitor. She has been diagnosed with glucose intolerance in the past. Her pregnancy test is negative today and there is no glucosuria.  Patient is a 31 y.o. female presenting with abdominal pain and vaginal discharge.  Abdominal Pain The primary symptoms of the illness include abdominal pain and vaginal discharge. The primary symptoms of the illness do not include fever, nausea, vomiting, diarrhea or dysuria.  The vaginal discharge is not associated with dysuria.  Symptoms associated with the illness do not include chills, urgency or frequency.  Vaginal Discharge Associated symptoms include abdominal pain. Pertinent negatives include no headaches.    Past Medical History  Diagnosis Date  . Abnormal Pap smear 2011    colpo and biopsy done, normal paps since  . PCOS (polycystic ovarian syndrome)   . Boil   . Boil, axilla   . Boil of buttock   . Depression   . Diabetes mellitus     . Obesity     Past Surgical History  Procedure Date  . Abcess drainage   . Colposcopy     Family History  Problem Relation Age of Onset  . Diabetes Maternal Aunt   . Hypertension Maternal Aunt     History  Substance Use Topics  . Smoking status: Never Smoker   . Smokeless tobacco: Never Used  . Alcohol Use: No    OB History    Grav Para Term Preterm Abortions TAB SAB Ect Mult Living   0 0              Review of Systems  Constitutional: Negative for fever and chills.  Gastrointestinal: Positive for abdominal pain. Negative for nausea, vomiting and diarrhea.  Genitourinary: Positive for vaginal discharge and pelvic pain. Negative for dysuria, urgency, frequency and flank pain.  Skin: Negative for rash.  Neurological: Negative for dizziness and headaches.  All other systems reviewed and are negative.    Allergies  Review of patient's allergies indicates no known allergies.  Home Medications   Current Outpatient Rx  Name  Route  Sig  Dispense  Refill  . FREESTYLE LITE DEVI      Test glucose daily.   1 each   0   . GLUCOSE BLOOD VI STRP      Use as instructed   100 each   12   . HYPROMELLOSE 2.5 % OP SOLN   Both Eyes   Place 1 drop into both eyes daily as needed. For blurred vision         . HYDROXYZINE HCL  25 MG PO TABS   Oral   Take 1 tablet (25 mg total) by mouth every 8 (eight) hours as needed for anxiety (Can take at bed time as needed for insomnia).   20 tablet   0   . MEDROXYPROGESTERONE ACETATE 10 MG PO TABS   Oral   Take 10 mg by mouth every 28 (twenty-eight) days. Do not take the week of menstruation         . METFORMIN HCL 850 MG PO TABS   Oral   Take 1 tablet (850 mg total) by mouth 2 (two) times daily with a meal.   60 tablet   3   . THERA M PLUS PO TABS   Oral   Take 1 tablet by mouth daily.           . TRAMADOL HCL 50 MG PO TABS   Oral   Take 1 tablet (50 mg total) by mouth every 8 (eight) hours as needed for pain.    20 tablet   0     BP 109/72  Pulse 60  Temp 98.2 F (36.8 C) (Oral)  Resp 16  SpO2 100%  Physical Exam  Nursing note and vitals reviewed. Constitutional: She is oriented to person, place, and time. She appears well-developed and well-nourished. No distress.  HENT:  Head: Normocephalic and atraumatic.  Eyes: No scleral icterus.  Neck: Neck supple. No thyromegaly present.  Cardiovascular: Normal heart sounds.   Pulmonary/Chest: Breath sounds normal.  Abdominal: Soft. She exhibits no distension and no mass. There is no rebound and no guarding. Hernia confirmed negative in the right inguinal area and confirmed negative in the left inguinal area.       Reported suprapubic tenderness with deep palpation  Genitourinary: Uterus normal.    There is no rash or lesion on the right labia. There is no rash on the left labia. Cervix exhibits no motion tenderness, no discharge and no friability. Right adnexum displays no mass, no tenderness and no fullness. Left adnexum displays no mass, no tenderness and no fullness. No erythema or bleeding around the vagina. Vaginal discharge found.  Lymphadenopathy:       Right: No inguinal adenopathy present.       Left: No inguinal adenopathy present.  Neurological: She is alert and oriented to person, place, and time.  Skin: No rash noted. She is not diaphoretic.    ED Course  Procedures (including critical care time)  Labs Reviewed  POCT URINALYSIS DIP (DEVICE) - Abnormal; Notable for the following:    Hgb urine dipstick TRACE (*)     All other components within normal limits  POCT PREGNANCY, URINE  CERVICOVAGINAL ANCILLARY ONLY   No results found.   1. Chronic pelvic pain in female   2. Vaginal discharge   3. Anxiety       MDM  Impress anxiety component. Mild vaginal discharge with no other abnormal findings on examination. Prescribed tramadol and hydroxyzine. Asked to followup with her GYN provider. Wet prep, GC and Chlamydia test  results pending at the time of discharge. Will contact patient if abnormal test results. Supportive care and red flags that should prompt her return to medical attention discussed with patient and provided in writing.        Sharin Grave, MD 07/01/12 [phone]

## 2012-07-08 NOTE — ED Notes (Signed)
GC/Chlamydia neg., Affirm: Candida and Trich neg., Gardnerella pos.  Message sent to Dr. Tressia Danas for order. Vassie Moselle 07/08/2012

## 2012-07-14 ENCOUNTER — Telehealth (HOSPITAL_COMMUNITY): Payer: Self-pay | Admitting: *Deleted

## 2012-07-14 MED ORDER — METRONIDAZOLE 500 MG PO TABS: 500.0000 mg | ORAL_TABLET | Freq: Two times a day (BID) | ORAL | Status: DC

## 2012-07-14 NOTE — ED Notes (Signed)
Order for Flagyl obtained from Hayden Rasmussen NP.  I called and voice mailbox is not set up. Unable to leave a message.  I called contact number and left a message for pt. to call. Vassie Moselle 07/14/2012

## 2012-10-07 ENCOUNTER — Emergency Department (HOSPITAL_COMMUNITY)
Admission: EM | Admit: 2012-10-07 | Discharge: 2012-10-07 | Disposition: A | Discharge status: Home / Self Care | Payer: Medicaid Other | Attending: Emergency Medicine | Admitting: Emergency Medicine

## 2012-10-07 ENCOUNTER — Encounter (HOSPITAL_COMMUNITY): Payer: Self-pay | Admitting: Emergency Medicine

## 2012-10-07 DIAGNOSIS — Z79899 Other long term (current) drug therapy: Secondary | ICD-10-CM | POA: Insufficient documentation

## 2012-10-07 DIAGNOSIS — R51 Headache: Secondary | ICD-10-CM | POA: Insufficient documentation

## 2012-10-07 DIAGNOSIS — K089 Disorder of teeth and supporting structures, unspecified: Secondary | ICD-10-CM | POA: Insufficient documentation

## 2012-10-07 DIAGNOSIS — Z8659 Personal history of other mental and behavioral disorders: Secondary | ICD-10-CM | POA: Insufficient documentation

## 2012-10-07 DIAGNOSIS — E119 Type 2 diabetes mellitus without complications: Secondary | ICD-10-CM | POA: Insufficient documentation

## 2012-10-07 DIAGNOSIS — Z8742 Personal history of other diseases of the female genital tract: Secondary | ICD-10-CM | POA: Insufficient documentation

## 2012-10-07 DIAGNOSIS — E669 Obesity, unspecified: Secondary | ICD-10-CM | POA: Insufficient documentation

## 2012-10-07 DIAGNOSIS — R220 Localized swelling, mass and lump, head: Secondary | ICD-10-CM | POA: Insufficient documentation

## 2012-10-07 DIAGNOSIS — K0889 Other specified disorders of teeth and supporting structures: Secondary | ICD-10-CM

## 2012-10-07 MED ORDER — IBUPROFEN 600 MG PO TABS: 600.0000 mg | ORAL_TABLET | Freq: Four times a day (QID) | ORAL | Status: DC | PRN

## 2012-10-07 MED ORDER — PENICILLIN V POTASSIUM 500 MG PO TABS: 500.0000 mg | ORAL_TABLET | Freq: Three times a day (TID) | ORAL | Status: DC

## 2012-10-07 MED ORDER — HYDROCODONE-ACETAMINOPHEN 5-325 MG PO TABS: ORAL_TABLET | ORAL | Status: DC

## 2012-10-07 NOTE — ED Provider Notes (Signed)
History    This chart was scribed for non-physician practitioner working with Vida Roller, MD by Leone Payor, ED Scribe. This patient was seen in room WTR8/WTR8 and the patient's care was started at 1612.   CSN: [id number]  Arrival date & time 10/07/12  1612   First MD Initiated Contact with Patient 10/07/12 1614      Chief Complaint  Patient presents with  . Dental Pain     The history is provided by the patient. No language interpreter was used.    Deanna Walker is a 32 y.o. female who presents to the Emergency Department complaining of new, constant right sided facial pain that has worsened in the past 1 week. Pt states she also has pain at a tooth site on upper right side where she had a tooth extracted 1 year ago. States the dentist said at the time that he may have chipped the other tooth at the time of extraction. Pt states she has been taking extra strength tylenol with some relief. She has associated HA, facial swelling, right sided neck pain. She denies trouble breathing or neck swelling.   Pt denies smoking and alcohol use.  Past Medical History  Diagnosis Date  . Abnormal Pap smear 2011    colpo and biopsy done, normal paps since  . PCOS (polycystic ovarian syndrome)   . Boil   . Boil, axilla   . Boil of buttock   . Depression   . Diabetes mellitus   . Obesity     Past Surgical History  Procedure Laterality Date  . Abcess drainage    . Colposcopy      Family History  Problem Relation Age of Onset  . Diabetes Maternal Aunt   . Hypertension Maternal Aunt     History  Substance Use Topics  . Smoking status: Never Smoker   . Smokeless tobacco: Never Used  . Alcohol Use: No    OB History   Grav Para Term Preterm Abortions TAB SAB Ect Mult Living   0 0              Review of Systems  Constitutional: Negative for fever.  HENT: Positive for facial swelling and dental problem. Negative for ear pain, sore throat, trouble swallowing and neck pain.    Respiratory: Negative for shortness of breath and stridor.   Skin: Negative for color change.  Neurological: Positive for headaches.    Allergies  Review of patient's allergies indicates no known allergies.  Home Medications   Current Outpatient Rx  Name  Route  Sig  Dispense  Refill  . Blood Glucose Monitoring Suppl (FREESTYLE LITE) DEVI      Test glucose daily.   1 each   0   . glucose blood (FREESTYLE LITE) test strip      Use as instructed   100 each   12   . hydroxypropyl methylcellulose (ISOPTO TEARS) 2.5 % ophthalmic solution   Both Eyes   Place 1 drop into both eyes daily as needed. For blurred vision         . hydrOXYzine (ATARAX/VISTARIL) 25 MG tablet   Oral   Take 1 tablet (25 mg total) by mouth every 8 (eight) hours as needed for anxiety (Can take at bed time as needed for insomnia).   20 tablet   0   . medroxyPROGESTERone (PROVERA) 10 MG tablet   Oral   Take 10 mg by mouth every 28 (twenty-eight) days. Do not take  the week of menstruation         . EXPIRED: metFORMIN (GLUCOPHAGE) 850 MG tablet   Oral   Take 1 tablet (850 mg total) by mouth 2 (two) times daily with a meal.   60 tablet   3   . metroNIDAZOLE (FLAGYL) 500 MG tablet   Oral   Take 1 tablet (500 mg total) by mouth 2 (two) times daily. X 7 days   14 tablet   0   . Multiple Vitamins-Minerals (MULTIVITAMINS THER. W/MINERALS) TABS   Oral   Take 1 tablet by mouth daily.           . traMADol (ULTRAM) 50 MG tablet   Oral   Take 1 tablet (50 mg total) by mouth every 8 (eight) hours as needed for pain.   20 tablet   0     BP 107/93  Pulse 71  Temp(Src) 98.3 F (36.8 C) (Oral)  Resp 20  Wt 180 lb (81.647 kg)  BMI 29.95 kg/m2  SpO2 98%  Physical Exam  Nursing note and vitals reviewed. Constitutional: She appears well-developed and well-nourished. No distress.  HENT:  Head: Normocephalic and atraumatic. No trismus in the jaw.  Right Ear: Tympanic membrane, external ear and  ear canal normal.  Left Ear: Tympanic membrane, external ear and ear canal normal.  Nose: Nose normal.  Mouth/Throat: Uvula is midline, oropharynx is clear and moist and mucous membranes are normal. Abnormal dentition. Dental caries present. No dental abscesses or edematous. No tonsillar abscesses.  Mild dental caries. Tenderness to palpation of the second right maxillary molar without gross abscess. No facial swelling.   Eyes: Conjunctivae and EOM are normal.  Neck: Normal range of motion. Neck supple. No tracheal deviation present.  No neck swelling or Ludwig's angina  Cardiovascular: Normal rate.   Pulmonary/Chest: Effort normal. No respiratory distress.  Musculoskeletal: Normal range of motion.  Lymphadenopathy:    She has no cervical adenopathy.  Neurological: She is alert.  Skin: Skin is warm and dry.  Psychiatric: She has a normal mood and affect. Her behavior is normal.    ED Course  Procedures (including critical care time)  DIAGNOSTIC STUDIES: Oxygen Saturation is 98% on room air, normal by my interpretation.    COORDINATION OF CARE: 4:44 PM Discussed treatment plan with pt at bedside and pt agreed to plan.    Labs Reviewed - No data to display No results found.   1. Pain, dental     5:21 PM Patient seen and examined.   Vital signs reviewed and are as follows: Filed Vitals:   10/07/12 1621  BP: 107/93  Pulse: 71  Temp: 98.3 F (36.8 C)  Resp: 20    Patient counseled to take prescribed medications as directed, return with worsening facial or neck swelling, and to follow-up with their dentist as soon as possible.   Patient counseled on use of narcotic pain medications. Counseled not to combine these medications with others containing tylenol. Urged not to drink alcohol, drive, or perform any other activities that requires focus while taking these medications. The patient verbalizes understanding and agrees with the plan.    MDM  Patient with toothache.  No  gross abscess.  Exam unconcerning for Ludwig's angina or other deep tissue infection in neck.  Will treat with penicillin and pain medicine.  Urged patient to follow-up with dentist.         I personally performed the services described in this documentation, which was scribed in  my presence. The recorded information has been reviewed and is accurate.   Renne Crigler, PA-C 10/07/12 1723

## 2012-10-07 NOTE — ED Notes (Signed)
Patient with headache and sharp pains on the right side of her head.  Also having pain at tooth site on upper right side where she had a tooth extracted one year ago.  Dentist said at the time that he may have chipped the other tooth at the time of extraction.

## 2012-10-07 NOTE — ED Provider Notes (Signed)
Medical screening examination/treatment/procedure(s) were performed by non-physician practitioner and as supervising physician I was immediately available for consultation/collaboration.    Shanaye Rief D Shiva Sahagian, MD 10/07/12 2332 

## 2012-10-19 ENCOUNTER — Telehealth: Payer: Self-pay | Admitting: *Deleted

## 2012-10-19 NOTE — Telephone Encounter (Signed)
Pt states she has some concerns. Requesting a call back.

## 2012-10-19 NOTE — Telephone Encounter (Signed)
Pt states she has a history of PCOS. Pt states she has been given Provera to have her cycles. Pt states she has had a light cycles/spotting the last 2 months. Pt states she is currently on Metformin. Pt states she would like to conceive and has been actively trying for about 1 1/2 years. Pt given an appointment for 01/23/13.

## 2012-10-25 ENCOUNTER — Encounter (HOSPITAL_COMMUNITY): Payer: Self-pay | Admitting: *Deleted

## 2012-10-25 ENCOUNTER — Emergency Department (HOSPITAL_COMMUNITY)
Admission: EM | Admit: 2012-10-25 | Discharge: 2012-10-25 | Disposition: A | Discharge status: Home / Self Care | Payer: Medicaid Other | Attending: Emergency Medicine | Admitting: Emergency Medicine

## 2012-10-25 DIAGNOSIS — Z79899 Other long term (current) drug therapy: Secondary | ICD-10-CM | POA: Insufficient documentation

## 2012-10-25 DIAGNOSIS — M26609 Unspecified temporomandibular joint disorder, unspecified side: Secondary | ICD-10-CM

## 2012-10-25 DIAGNOSIS — Z8659 Personal history of other mental and behavioral disorders: Secondary | ICD-10-CM | POA: Insufficient documentation

## 2012-10-25 DIAGNOSIS — Z872 Personal history of diseases of the skin and subcutaneous tissue: Secondary | ICD-10-CM | POA: Insufficient documentation

## 2012-10-25 DIAGNOSIS — J309 Allergic rhinitis, unspecified: Secondary | ICD-10-CM | POA: Insufficient documentation

## 2012-10-25 DIAGNOSIS — E669 Obesity, unspecified: Secondary | ICD-10-CM | POA: Insufficient documentation

## 2012-10-25 DIAGNOSIS — Z8742 Personal history of other diseases of the female genital tract: Secondary | ICD-10-CM | POA: Insufficient documentation

## 2012-10-25 DIAGNOSIS — J302 Other seasonal allergic rhinitis: Secondary | ICD-10-CM

## 2012-10-25 DIAGNOSIS — E119 Type 2 diabetes mellitus without complications: Secondary | ICD-10-CM | POA: Insufficient documentation

## 2012-10-25 NOTE — ED Provider Notes (Signed)
Medical screening examination/treatment/procedure(s) were performed by non-physician practitioner and as supervising physician I was immediately available for consultation/collaboration.   Lyanne Co, MD 10/25/12 1550

## 2012-10-25 NOTE — ED Notes (Signed)
Pt reports mouth/jaw pain x3 days, pain 7/10. Pain when pts opens mouth or tries to eat food.

## 2012-10-25 NOTE — ED Provider Notes (Signed)
History     CSN: [id number]  Arrival date & time 10/25/12  1313   First MD Initiated Contact with Patient 10/25/12 1338      Chief Complaint  Patient presents with  . Dental Pain    (Consider location/radiation/quality/duration/timing/severity/associated sxs/prior treatment) HPI Comments: 32 year old female presents to the emergency department complaining of bilateral jaw pain x3 days. Patient states she was eating 3 days ago when the pain began, described as sharp, rated 7/10. Tried taking an oxycodone with temporary relief. Pain worse when opening and closing her mouth or eating food. Denies dental pain. Also complaining of itchy watery eyes on and off for the past month. Unsure if she has seasonal allergies.  Patient is a 32 y.o. female presenting with tooth pain. The history is provided by the patient.  Dental PainPrimary symptoms do not include fever.  Additional symptoms do not include: trouble swallowing.    Past Medical History  Diagnosis Date  . Abnormal Pap smear 2011    colpo and biopsy done, normal paps since  . PCOS (polycystic ovarian syndrome)   . Boil   . Boil, axilla   . Boil of buttock   . Depression   . Diabetes mellitus   . Obesity     Past Surgical History  Procedure Laterality Date  . Abcess drainage    . Colposcopy      Family History  Problem Relation Age of Onset  . Diabetes Maternal Aunt   . Hypertension Maternal Aunt     History  Substance Use Topics  . Smoking status: Never Smoker   . Smokeless tobacco: Never Used  . Alcohol Use: No    OB History   Grav Para Term Preterm Abortions TAB SAB Ect Mult Living   0 0              Review of Systems  Constitutional: Negative for fever and chills.  HENT: Negative for trouble swallowing and dental problem.        Positive for jaw pain.  Eyes: Positive for discharge and itching. Negative for pain, redness and visual disturbance.  All other systems reviewed and are  negative.    Allergies  Review of patient's allergies indicates no known allergies.  Home Medications   Current Outpatient Rx  Name  Route  Sig  Dispense  Refill  . HYDROcodone-acetaminophen (NORCO/VICODIN) 5-325 MG per tablet      Take 1-2 tablets every 6 hours as needed for severe pain   12 tablet   0   . hydroxypropyl methylcellulose (ISOPTO TEARS) 2.5 % ophthalmic solution   Both Eyes   Place 1 drop into both eyes daily as needed. For blurred vision         . Multiple Vitamins-Minerals (MULTIVITAMINS THER. W/MINERALS) TABS   Oral   Take 1 tablet by mouth daily.           . medroxyPROGESTERone (PROVERA) 10 MG tablet   Oral   Take 10 mg by mouth daily. Do not take the week of menstruation         . metFORMIN (GLUCOPHAGE) 850 MG tablet   Oral   Take 850 mg by mouth 2 (two) times daily with a meal.           BP 117/68  Pulse 72  Temp(Src) 97.8 F (36.6 C) (Oral)  Resp 16  SpO2 100%  Physical Exam  Nursing note and vitals reviewed. Constitutional: She is oriented to person, place, and time. She  appears well-developed and well-nourished. No distress.  HENT:  Head: Normocephalic and atraumatic.    Tenderness at bilateral TMJ with crepitus.  Eyes: Conjunctivae and EOM are normal. Pupils are equal, round, and reactive to light.  Watery discharge from bilateral eyes. No injection.  Neck: Normal range of motion. Neck supple.  Cardiovascular: Normal rate, regular rhythm and normal heart sounds.   Pulmonary/Chest: Effort normal and breath sounds normal.  Musculoskeletal: Normal range of motion. She exhibits no edema.  Neurological: She is alert and oriented to person, place, and time.  Skin: Skin is warm and dry. No erythema.  Psychiatric: She has a normal mood and affect. Her behavior is normal.    ED Course  Procedures (including critical care time)  Labs Reviewed - No data to display No results found.   1. TMJ (temporomandibular joint syndrome)    2. Seasonal allergies       MDM  TMJ- advised ibuprofen, f/u with her dentist for possible mouth guard. No dental pain or abscess. Allergies- advised OTC allergy pill and eye drops. Patient states understanding of plan and is agreeable.        Trevor Mace, PA-C 10/25/12 1415

## 2013-01-23 ENCOUNTER — Ambulatory Visit (INDEPENDENT_AMBULATORY_CARE_PROVIDER_SITE_OTHER): Payer: Medicaid Other | Admitting: Obstetrics & Gynecology

## 2013-01-23 VITALS — BP 113/71 | HR 70 | Temp 97.9°F | Wt 165.0 lb

## 2013-01-23 DIAGNOSIS — N979 Female infertility, unspecified: Secondary | ICD-10-CM

## 2013-01-23 DIAGNOSIS — N970 Female infertility associated with anovulation: Secondary | ICD-10-CM

## 2013-01-23 DIAGNOSIS — N926 Irregular menstruation, unspecified: Secondary | ICD-10-CM

## 2013-01-23 MED ORDER — LETROZOLE 2.5 MG PO TABS: 2.5000 mg | ORAL_TABLET | Freq: Every day | ORAL | Status: DC

## 2013-01-23 MED ORDER — PRENATAL VITAMINS 0.8 MG PO TABS: 1.0000 | ORAL_TABLET | Freq: Every day | ORAL | Status: DC

## 2013-01-23 MED ORDER — MEDROXYPROGESTERONE ACETATE 5 MG PO TABS: 5.0000 mg | ORAL_TABLET | Freq: Every day | ORAL | Status: DC

## 2013-01-23 NOTE — Progress Notes (Signed)
Subjective:     Deanna Walker is a 32 y.o. female here for a routine exam.  Current complaints: consult. Pt states she has been trying to get pregnant for the last 2 years. Pt states she has not been successful. Pt states she has a history of PCOS.  Pt states she had a cyst diagnosed in    Personal health questionnaire reviewed: yes.   Gynecologic History Patient's last menstrual period was 01/19/2013. Contraception: none Last Pap: 04/2012. Results were: normal Last mammogram: n/a. Results were: n/a  Obstetric History OB History   Grav Para Term Preterm Abortions TAB SAB Ect Mult Living   0 0               The following portions of the patient's history were reviewed and updated as appropriate: allergies, current medications, past family history, past medical history, past social history, past surgical history and problem list.  Review of Systems Pertinent items are noted in HPI.    Objective:   General:  alert     Abdomen: soft, non-tender; bowel sounds normal; no masses,  no organomegaly   Vulva:  normal  Vagina: normal vagina  Cervix:  no lesions  Corpus: normal size, contour, position, consistency, mobility, non-tender  Adnexa:  normal adnexa    Assessment:   PCOS Infertility--anovulation  Plan:   Basic infertility/preconception lab evaluation Start ovulation induction with Femara Return prn

## 2013-01-24 ENCOUNTER — Encounter: Payer: Self-pay | Admitting: Obstetrics & Gynecology

## 2013-01-24 DIAGNOSIS — N970 Female infertility associated with anovulation: Secondary | ICD-10-CM | POA: Insufficient documentation

## 2013-01-24 HISTORY — DX: Female infertility associated with anovulation: N97.0

## 2013-01-24 LAB — CHOLESTEROL, TOTAL: Cholesterol: 159 mg/dL (ref 0–200)

## 2013-01-24 LAB — CBC
HCT: 43 % (ref 36.0–46.0)
Platelets: 360 10*3/uL (ref 150–400)
RBC: 5.3 MIL/uL — ABNORMAL HIGH (ref 3.87–5.11)
RDW: 14.7 % (ref 11.5–15.5)
WBC: 8.9 10*3/uL (ref 4.0–10.5)

## 2013-01-24 LAB — HEPATITIS B SURFACE ANTIGEN: Hepatitis B Surface Ag: NEGATIVE

## 2013-01-24 LAB — RPR

## 2013-01-24 LAB — HEMOGLOBIN A1C
Hgb A1c MFr Bld: 6.1 % — ABNORMAL HIGH (ref <5.7)
Mean Plasma Glucose: 128 mg/dL — ABNORMAL HIGH (ref <117)

## 2013-01-24 LAB — GC/CHLAMYDIA PROBE AMP: GC Probe RNA: NEGATIVE

## 2013-01-24 NOTE — Patient Instructions (Signed)
Infertility WHAT IS INFERTILITY?  Infertility is usually defined as not being able to get pregnant after trying for one year of regular sexual intercourse without the use of contraceptives. Or not being able to carry a pregnancy to term and have a baby. The infertility rate in the [place] is around 10%. Pregnancy is the result of a chain of events. A woman must release an egg from one of her ovaries (ovulation). The egg must be fertilized by the female sperm. Then it travels through a fallopian tube into the uterus (womb), where it attaches to the wall of the uterus and grows. A man must have enough sperm, and the sperm must join with (fertilize) the egg along the way, at the proper time. The fertilized egg must then become attached to the inside of the uterus. While this may seem simple, many things can happen to prevent pregnancy from occurring.  WHOSE PROBLEM IS IT?  About 20% of infertility cases are due to problems with the man (female factors) and 65% are due to problems with the woman (female factors). Other cases are due to a combination of female and female factors or to unknown causes.  WHAT CAUSES INFERTILITY IN MEN?  Infertility in men is often caused by problems with making enough normal sperm or getting the sperm to reach the egg. Problems with sperm may exist from birth or develop later in life, due to illness or injury. Some men produce no sperm, or produce too few sperm (oligospermia). Other problems include:  Sexual dysfunction.  Hormonal or endocrine problems.  Age. Female fertility decreases with age, but not at as young an age as female fertility.  Infection.  Congenital problems. Birth defect, such as absence of the tubes that carry the sperm (vas deferens).  Genetic/chromosomal problems.  Antisperm antibody problems.  Retrograde ejaculation (sperm go into the bladder).  Varicoceles, spematoceles, or tumors of the testicles.  Lifestyle can influence the number and  quality of a man's sperm.  Alcohol and drugs can temporarily reduce sperm quality.  Environmental toxins, including pesticides and lead, may cause some cases of infertility in men. WHAT CAUSES INFERTILITY IN WOMEN?   Problems with ovulation account for most infertility in women. Without ovulation, eggs are not available to be fertilized.  Signs of problems with ovulation include irregular menstrual periods or no periods at all.  Simple lifestyle factors, including stress, diet, or athletic training, can affect a woman's hormonal balance.  Age. Fertility begins to decrease in women in the early 30s and is worse after age 37.  Much less often, a hormonal imbalance from a serious medical problem, such as a pituitary gland tumor, thyroid or other chronic medical disease, can cause ovulation problems.  Pelvic infections.  Polycystic ovary syndrome (increase in female hormones, unable to ovulate).  Alcohol or illegal drugs.  Environmental toxins, radiation, pesticides, and certain chemicals.  Aging is an important factor in female infertility.  The ability of a woman's ovaries to produce eggs declines with age, especially after age 35. About one third of couples where the woman is over 35 will have problems with fertility.  By the time she reaches menopause when her monthly periods stop for good, a woman can no longer produce eggs or become pregnant.  Other problems can also lead to infertility in women. If the fallopian tubes are blocked at one or both ends, the egg cannot travel through the tubes into the uterus. Scar tissue (adhesions) in the pelvis may cause blocked   tubes. This may result from pelvic inflammatory disease, endometriosis, or surgery for an ectopic pregnancy (fertilized egg implanted outside the uterus) or any pelvic or abdominal surgery causing adhesions.  Fibroid tumors or polyps of the uterus.  Congenital (birth defect) abnormalities of the uterus.  Infection of the  cervix (cervicitis).  Cervical stenosis (narrowing).  Abnormal cervical mucus.  Polycystic ovary syndrome.  Having sexual intercourse too often (every other day or 4 to 5 times a week).  Obesity.  Anorexia.  Poor nutrition.  Over exercising, with loss of body fat.  DES. Your mother received diethylstilbesterol hormone when pregnant with you. HOW IS INFERTILITY TESTED?  If you have been trying to have a baby without success, you may want to seek medical help. You should not wait for one year of trying before seeing a health care provider if:  You are over 35.  You have reason to believe that there may be a fertility problem. A medical evaluation may determine the reasons for a couple's infertility. Usually this process begins with:  Physical exams.  Medical histories of both partners.  Sexual histories of both partners. If there is no obvious problem, like improperly timed intercourse or absence of ovulation, tests may be needed.   For a man, testing usually begins with tests of his semen to look at:  The number of sperm.  The shape of sperm.  Movement of his sperm.  Taking a complete medical and surgical history.  Physical examination.  Check for infection of the female reproductive organs. Sometimes hormone tests are done.   For a woman, the first step in testing is to find out if she is ovulating each month. There are several ways to do this. For example, she can keep track of changes in her morning body temperature and in the texture of her cervical mucus. Another tool is a home ovulation test kit, which can be bought at drug or grocery stores.  Checks of ovulation can also be done in the doctor's office, using blood tests for hormone levels or ultrasound tests of the ovaries. If the woman is ovulating, more tests will need to be done. Some common female tests include:  Hysterosalpingogram: An x-ray of the fallopian tubes and uterus after they are injected with  dye. It shows if the tubes are open and shows the shape of the uterus.  Laparoscopy: An exam of the tubes and other female organs for disease. A lighted tube called a laparoscope is used to see inside the abdomen.  Endometrial biopsy: Sample of uterus tissue taken on the first day of the menstrual period, to see if the tissue indicates you are ovulating.  Transvaginal ultrasound: Examines the female organs.  Hysteroscopy: Uses a lighted tube to examine the cervix and inside the uterus, to see if there are any abnormalities inside the uterus. TREATMENT  Depending on the test results, different treatments can be suggested. The type of treatment depends on the cause. 85 to 90% of infertility cases are treated with drugs or surgery.   Various fertility drugs may be used for women with ovulation problems. It is important to talk with your caregiver about the drug to be used. You should understand the drug's benefits and side effects. Depending on the type of fertility drug and the dosage of the drug used, multiple births (twins or multiples) can occur in some women.  If needed, surgery can be done to repair damage to a woman's ovaries, fallopian tubes, cervix, or uterus.  Surgery   or medical treatment for endometriosis or polycystic ovary syndrome. Sometimes a man has an infertility problem that can be corrected with medicine or by surgery.  Intrauterine insemination (IUI) of sperm, timed with ovulation.  Change in lifestyle, if that is the cause (lose weight, increase exercise, and stop smoking, drinking excessively, or taking illegal drugs).  Other types of surgery:  Removing growths inside and on the uterus.  Removing scar tissue from inside of the uterus.  Fixing blocked tubes.  Removing scar tissue in the pelvis and around the female organs. WHAT IS ASSISTED REPRODUCTIVE TECHNOLOGY (ART)?  Assisted reproductive technology (ART) is another form of special methods used to help infertile  couples. ART involves handling both the woman's eggs and the man's sperm. Success rates vary and depend on many factors. ART can be expensive and time-consuming. But ART has made it possible for many couples to have children that otherwise would not have been conceived. Some methods are listed below:  In vitro fertilization (IVF). IVF is often used when a woman's fallopian tubes are blocked or when a man has low sperm counts. A drug is used to stimulate the ovaries to produce multiple eggs. Once mature, the eggs are removed and placed in a culture dish with the man's sperm for fertilization. After about 40 hours, the eggs are examined to see if they have become fertilized by the sperm and are dividing into cells. These fertilized eggs (embryos) are then placed in the woman's uterus. This bypasses the fallopian tubes.  Gamete intrafallopian transfer (GIFT) is similar to IVF, but used when the woman has at least one normal fallopian tube. Three to five eggs are placed in the fallopian tube, along with the man's sperm, for fertilization inside the woman's body.  Zygote intrafallopian transfer (ZIFT), also called tubal embryo transfer, combines IVF and GIFT. The eggs retrieved from the woman's ovaries are fertilized in the lab and placed in the fallopian tubes rather than in the uterus.  ART procedures sometimes involve the use of donor eggs (eggs from another woman) or previously frozen embryos. Donor eggs may be used if a woman has impaired ovaries or has a genetic disease that could be passed on to her baby.  When performing ART, you are at higher risk for resulting in multiple pregnancies, twins, triplets or more.  Intracytoplasma sperm injection is a procedure that injects a single sperm into the egg to fertilize it.  Embryo transplant is a procedure that starts after growing an embryo in a special media (chemical solution) developed to keep the embryo alive for 2 to 5 days, and then transplanting it  into the uterus. In cases where a cause cannot be found and pregnancy does not occur, adoption may be a consideration. Document Released: 06/25/2003 Document Revised: 09/14/2011 Document Reviewed: 05/21/2009 [employer] Patient Information 2014 [employer].  

## 2013-01-25 LAB — HEMOGLOBINOPATHY EVALUATION: Hgb A2 Quant: 2.6 % (ref 2.2–3.2)

## 2013-01-31 ENCOUNTER — Other Ambulatory Visit: Payer: Medicaid Other

## 2013-01-31 ENCOUNTER — Other Ambulatory Visit: Payer: Self-pay | Admitting: *Deleted

## 2013-01-31 DIAGNOSIS — N949 Unspecified condition associated with female genital organs and menstrual cycle: Secondary | ICD-10-CM

## 2013-02-06 ENCOUNTER — Encounter: Payer: Self-pay | Admitting: Obstetrics & Gynecology

## 2013-02-06 ENCOUNTER — Ambulatory Visit: Payer: Medicaid Other | Admitting: Obstetrics & Gynecology

## 2013-02-08 ENCOUNTER — Telehealth: Payer: Self-pay | Admitting: *Deleted

## 2013-02-08 NOTE — Telephone Encounter (Signed)
Message copied by Glendell Docker on Wed Feb 08, 2013  6:33 PM ------      Message from: Antionette Char      Created: Mon Jan 30, 2013 10:02 PM         Call pt--need to get HgbA1c 6 or less.  Needs rubella vaccine      ----- Message -----         From: [facility]         Sent: 01/24/2013   9:30 AM           To: Antionette Char, MD                   ------

## 2013-02-08 NOTE — Telephone Encounter (Signed)
Attempted to contact patient at [phone], voice recording reached stating number was invalid. Letter mailed to patient. Patient. Patient is scheduled for appointment with Dr Tamela Oddi on 02/14/2013

## 2013-02-14 ENCOUNTER — Other Ambulatory Visit: Payer: Medicaid Other

## 2013-02-15 ENCOUNTER — Other Ambulatory Visit: Payer: Medicaid Other

## 2013-02-16 ENCOUNTER — Ambulatory Visit: Payer: Medicaid Other | Admitting: Obstetrics & Gynecology

## 2013-02-22 NOTE — Telephone Encounter (Signed)
Attempted to contact patient at [phone], number continues to be a non working phone number. Patient has appointment on file for 03/08/2013 with Dr Tamela Oddi. No return contact received from patient.

## 2013-03-08 ENCOUNTER — Encounter: Payer: Self-pay | Admitting: Obstetrics & Gynecology

## 2013-03-08 ENCOUNTER — Ambulatory Visit (INDEPENDENT_AMBULATORY_CARE_PROVIDER_SITE_OTHER): Payer: Medicaid Other | Admitting: Obstetrics & Gynecology

## 2013-03-08 ENCOUNTER — Ambulatory Visit (INDEPENDENT_AMBULATORY_CARE_PROVIDER_SITE_OTHER): Payer: Medicaid Other

## 2013-03-08 VITALS — BP 135/78 | HR 69 | Temp 98.4°F | Ht 65.0 in | Wt 181.2 lb

## 2013-03-08 DIAGNOSIS — N926 Irregular menstruation, unspecified: Secondary | ICD-10-CM

## 2013-03-08 DIAGNOSIS — Z3202 Encounter for pregnancy test, result negative: Secondary | ICD-10-CM

## 2013-03-08 DIAGNOSIS — E282 Polycystic ovarian syndrome: Secondary | ICD-10-CM

## 2013-03-08 DIAGNOSIS — N949 Unspecified condition associated with female genital organs and menstrual cycle: Secondary | ICD-10-CM

## 2013-03-08 DIAGNOSIS — N946 Dysmenorrhea, unspecified: Secondary | ICD-10-CM

## 2013-03-08 LAB — POCT URINE PREGNANCY: Preg Test, Ur: NEGATIVE

## 2013-03-08 NOTE — Progress Notes (Signed)
.   Subjective:     IDAMAE COCCIA is a 32 y.o. female here for a follow up exam.  Patient is here today for her ultrasound and lab work results.  Personal health questionnaire reviewed: no.   Gynecologic History Patient's last menstrual period was 01/19/2013. Contraception: none Last Pap: 2014. Results were: normal Last mammogram: N/A  Obstetric History OB History  Gravida Para Term Preterm AB SAB TAB Ectopic Multiple Living  0 0                 The following portions of the patient's history were reviewed and updated as appropriate: allergies, current medications, past family history, past medical history, past social history, past surgical history and problem list.  Review of Systems Pertinent items are noted in HPI.    Objective:    No exam today  Assessment:   Desires to achieve pregnancy Diabetes mellitus PCOS    Plan:    Recommend optimize glycemic control prior to ovulation induction;  Target Hgb A1c < 6 Labs reviewed Continue efforts for weight loss Return in few months

## 2013-03-11 ENCOUNTER — Encounter: Payer: Self-pay | Admitting: Obstetrics & Gynecology

## 2013-04-25 ENCOUNTER — Encounter: Payer: Self-pay | Admitting: Obstetrics & Gynecology

## 2013-04-26 ENCOUNTER — Encounter (HOSPITAL_COMMUNITY): Payer: Self-pay | Admitting: Emergency Medicine

## 2013-04-26 ENCOUNTER — Emergency Department (HOSPITAL_COMMUNITY)
Admission: EM | Admit: 2013-04-26 | Discharge: 2013-04-26 | Disposition: A | Discharge status: Home / Self Care | Payer: Medicaid Other | Attending: Emergency Medicine | Admitting: Emergency Medicine

## 2013-04-26 DIAGNOSIS — L02419 Cutaneous abscess of limb, unspecified: Secondary | ICD-10-CM | POA: Insufficient documentation

## 2013-04-26 DIAGNOSIS — E669 Obesity, unspecified: Secondary | ICD-10-CM | POA: Insufficient documentation

## 2013-04-26 DIAGNOSIS — Z8659 Personal history of other mental and behavioral disorders: Secondary | ICD-10-CM | POA: Insufficient documentation

## 2013-04-26 DIAGNOSIS — L509 Urticaria, unspecified: Secondary | ICD-10-CM | POA: Insufficient documentation

## 2013-04-26 DIAGNOSIS — Z9889 Other specified postprocedural states: Secondary | ICD-10-CM | POA: Insufficient documentation

## 2013-04-26 DIAGNOSIS — Z3202 Encounter for pregnancy test, result negative: Secondary | ICD-10-CM | POA: Insufficient documentation

## 2013-04-26 DIAGNOSIS — R350 Frequency of micturition: Secondary | ICD-10-CM | POA: Insufficient documentation

## 2013-04-26 DIAGNOSIS — Z872 Personal history of diseases of the skin and subcutaneous tissue: Secondary | ICD-10-CM | POA: Insufficient documentation

## 2013-04-26 DIAGNOSIS — L02416 Cutaneous abscess of left lower limb: Secondary | ICD-10-CM

## 2013-04-26 DIAGNOSIS — E119 Type 2 diabetes mellitus without complications: Secondary | ICD-10-CM | POA: Insufficient documentation

## 2013-04-26 LAB — URINALYSIS, ROUTINE W REFLEX MICROSCOPIC
Ketones, ur: NEGATIVE mg/dL
Leukocytes, UA: NEGATIVE
Nitrite: NEGATIVE
Protein, ur: NEGATIVE mg/dL
pH: 6 (ref 5.0–8.0)

## 2013-04-26 LAB — POCT PREGNANCY, URINE: Preg Test, Ur: NEGATIVE

## 2013-04-26 MED ORDER — SULFAMETHOXAZOLE-TRIMETHOPRIM 800-160 MG PO TABS: 1.0000 | ORAL_TABLET | Freq: Two times a day (BID) | ORAL | Status: DC

## 2013-04-26 NOTE — ED Provider Notes (Signed)
Medical screening examination/treatment/procedure(s) were performed by non-physician practitioner and as supervising physician I was immediately available for consultation/collaboration.    Santiago Stenzel, MD 04/26/13 0506 

## 2013-04-26 NOTE — ED Notes (Signed)
Nursing staff x 3 unable to locate patient

## 2013-04-26 NOTE — ED Notes (Signed)
Pt reports she has been breaking out in a rash from her feet up to her torso and arms.  Pt reports the small raised areas itch and will come and go when taking Benadryl.  Pt reports she has been taking this and steroids but is still having the rash. Now currently to her L arm

## 2013-04-26 NOTE — ED Provider Notes (Signed)
CSN: [id number]     Arrival date & time 04/26/13  0006 History   First MD Initiated Contact with Patient 04/26/13 0053     Chief Complaint  Patient presents with  . Rash   HPI  History provided by the patient. Patient is a 32 year old female presenting with complaints of pruritic rash to arms. Patient states that she has been breaking out with rash and is primarily to her arms and occasionally her feet for the past several weeks. She states she is using hydrocortisone cream, Benadryl creams and Benadryl by mouth for symptoms. She denies any new lotions, creams or soaps. She denies having some symptoms previously. She denies any unusual or new foods. Patient also complains of some different lesions to her groin and thigh area. She states these have been present for the past week. Has been occasional drainage from the areas. She is unsure if they're related to her other pruritic rash. She denies any vaginal bleeding or vaginal discharge. She denies any dysuria but does have urinary frequency. No hematuria. No other aggravating or alleviating factors. No other associated symptoms.    Past Medical History  Diagnosis Date  . Abnormal Pap smear 2011    colpo and biopsy done, normal paps since  . PCOS (polycystic ovarian syndrome)   . Boil   . Boil, axilla   . Boil of buttock   . Depression   . Diabetes mellitus   . Obesity    Past Surgical History  Procedure Laterality Date  . Abcess drainage    . Colposcopy     Family History  Problem Relation Age of Onset  . Diabetes Maternal Aunt   . Hypertension Maternal Aunt    History  Substance Use Topics  . Smoking status: Never Smoker   . Smokeless tobacco: Never Used  . Alcohol Use: No   OB History   Grav Para Term Preterm Abortions TAB SAB Ect Mult Living   0 0             Review of Systems  Constitutional: Negative for fever, chills and diaphoresis.  Respiratory: Negative for cough, shortness of breath and wheezing.    Genitourinary: Positive for frequency. Negative for dysuria, hematuria, flank pain, vaginal bleeding, vaginal discharge and genital sores.  Skin: Positive for rash.  All other systems reviewed and are negative.    Allergies  Review of patient's allergies indicates no known allergies.  Home Medications   Current Outpatient Rx  Name  Route  Sig  Dispense  Refill  . diphenhydrAMINE (BENADRYL) 25 mg capsule   Oral   Take 25 mg by mouth every 6 (six) hours as needed for allergies.          BP 117/69  Pulse 86  Temp(Src) 98.5 F (36.9 C) (Oral)  Resp 18  Ht 5\' 5"  (1.651 m)  Wt 180 lb (81.647 kg)  BMI 29.95 kg/m2  SpO2 98%  LMP 03/13/2013 Physical Exam  Nursing note and vitals reviewed. Constitutional: She is oriented to person, place, and time. She appears well-developed and well-nourished. No distress.  HENT:  Head: Normocephalic and atraumatic.  Mouth/Throat: Oropharynx is clear and moist.  Eyes: Conjunctivae are normal.  Neck: Normal range of motion. Neck supple.  Cardiovascular: Normal rate and regular rhythm.   Pulmonary/Chest: Effort normal and breath sounds normal. No stridor. No respiratory distress. She has no wheezes. She has no rales.  Abdominal: Soft.  Musculoskeletal: Normal range of motion.  Neurological: She is alert and  oriented to person, place, and time.  Skin: Skin is warm and dry. Rash noted.  Urticarial type rash to the left forearm.  Tender nodule with fluctuance to the posterior medial proximal left thigh.  Psychiatric: She has a normal mood and affect. Her behavior is normal.    ED Course  Procedures      INCISION AND DRAINAGE Performed by: Angus Seller Consent: Verbal consent obtained. Risks and benefits: risks, benefits and alternatives were discussed Type: abscess  Body area: left posterior thigh  Anesthesia: local infiltration  Incision was made with a scalpel.  Local anesthetic: lidocaine 2% without  epinephrine  Anesthetic total: 2 ml  Complexity: complex Blunt dissection to break up loculations  Drainage: purulent  Drainage amount: small  Patient tolerance: Patient tolerated the procedure well with no immediate complications.  Results for orders placed during the hospital encounter of 04/26/13  URINALYSIS, ROUTINE W REFLEX MICROSCOPIC      Result Value Range   Color, Urine YELLOW  YELLOW   APPearance CLEAR  CLEAR   Specific Gravity, Urine 1.024  1.005 - 1.030   pH 6.0  5.0 - 8.0   Glucose, UA NEGATIVE  NEGATIVE mg/dL   Hgb urine dipstick NEGATIVE  NEGATIVE   Bilirubin Urine NEGATIVE  NEGATIVE   Ketones, ur NEGATIVE  NEGATIVE mg/dL   Protein, ur NEGATIVE  NEGATIVE mg/dL   Urobilinogen, UA 0.2  0.0 - 1.0 mg/dL   Nitrite NEGATIVE  NEGATIVE   Leukocytes, UA NEGATIVE  NEGATIVE  POCT PREGNANCY, URINE      Result Value Range   Preg Test, Ur NEGATIVE  NEGATIVE            MDM   1. Abscess of left thigh   2. Hives     Patient is well-appearing no acute distress. She does not appear severely ill or toxic. She had a small boil to the left posterior proximal thigh. Patient instructed to followup with PCP or dermatology specialist for continued evaluation of rash.     Angus Seller, PA-C 04/26/13 [phone]

## 2013-04-26 NOTE — ED Notes (Signed)
In to re-evaluate pt-pt not in room

## 2013-04-26 NOTE — ED Notes (Signed)
PA at bedside.

## 2013-05-15 ENCOUNTER — Telehealth (HOSPITAL_COMMUNITY): Payer: Self-pay

## 2013-05-15 NOTE — ED Notes (Signed)
Pt calling stating a nurse had called from "[phone]" number and calling to see what it was about.  ID verified x 2.  Informed pt current writer unable to find any notes in chart as to nature of said call.

## 2013-09-07 ENCOUNTER — Ambulatory Visit: Payer: Medicaid Other | Admitting: Obstetrics & Gynecology

## 2013-10-18 ENCOUNTER — Ambulatory Visit: Payer: Medicaid Other | Admitting: Obstetrics & Gynecology

## 2013-10-28 ENCOUNTER — Encounter (HOSPITAL_COMMUNITY): Payer: Self-pay | Admitting: Emergency Medicine

## 2013-10-28 ENCOUNTER — Emergency Department (HOSPITAL_COMMUNITY)
Admission: EM | Admit: 2013-10-28 | Discharge: 2013-10-28 | Discharge status: Against Medical Advice | Payer: Medicaid Other | Attending: Emergency Medicine | Admitting: Emergency Medicine

## 2013-10-28 DIAGNOSIS — M79609 Pain in unspecified limb: Secondary | ICD-10-CM | POA: Insufficient documentation

## 2013-10-28 DIAGNOSIS — E119 Type 2 diabetes mellitus without complications: Secondary | ICD-10-CM | POA: Insufficient documentation

## 2013-10-28 DIAGNOSIS — R109 Unspecified abdominal pain: Secondary | ICD-10-CM | POA: Insufficient documentation

## 2013-10-28 NOTE — ED Notes (Signed)
Pt from home c/o R arm pain x1 month, denies injury. Pt taking ibuprofen and tylenol with no relied. Pt adds that she has abd pain for months. Pt dx with POS, but has radiating pain to her groin and no menstrual cycle 7 months. Pt denies dysuria, but reports frequency. Pt is A&O and in NAD

## 2013-11-03 ENCOUNTER — Ambulatory Visit: Payer: Medicaid Other | Admitting: Obstetrics & Gynecology

## 2013-11-06 ENCOUNTER — Encounter: Payer: Self-pay | Admitting: Obstetrics & Gynecology

## 2013-11-06 ENCOUNTER — Ambulatory Visit (INDEPENDENT_AMBULATORY_CARE_PROVIDER_SITE_OTHER): Payer: Medicaid Other | Admitting: Obstetrics & Gynecology

## 2013-11-06 VITALS — BP 123/75 | HR 64 | Temp 97.8°F | Ht 65.0 in | Wt 193.0 lb

## 2013-11-06 DIAGNOSIS — E282 Polycystic ovarian syndrome: Secondary | ICD-10-CM

## 2013-11-06 DIAGNOSIS — N970 Female infertility associated with anovulation: Secondary | ICD-10-CM

## 2013-11-06 MED ORDER — LETROZOLE 2.5 MG PO TABS: 2.5000 mg | ORAL_TABLET | Freq: Every day | ORAL | Status: DC

## 2013-11-06 MED ORDER — METFORMIN HCL ER 750 MG PO TB24: 750.0000 mg | ORAL_TABLET | Freq: Two times a day (BID) | ORAL | Status: DC

## 2013-11-07 NOTE — Patient Instructions (Signed)
Letrozole tablets  What is this medicine?  LETROZOLE (LET roe zole) blocks the production of estrogen. Certain types of breast cancer grow under the influence of estrogen. Letrozole helps block tumor growth. This medicine is used to treat advanced breast cancer in postmenopausal women.  This medicine may be used for other purposes; ask your health care provider or pharmacist if you have questions.  COMMON BRAND NAME(S): Femara  What should I tell my health care provider before I take this medicine?  They need to know if you have any of these conditions:  -liver disease  -osteoporosis (weak bones)  -an unusual or allergic reaction to letrozole, other medicines, foods, dyes, or preservatives  -pregnant or trying to get pregnant  -breast-feeding  How should I use this medicine?  Take this medicine by mouth with a glass of water. You may take it with or without food. Follow the directions on the prescription label. Take your medicine at regular intervals. Do not take your medicine more often than directed. Do not stop taking except on your doctor's advice.  Talk to your pediatrician regarding the use of this medicine in children. Special care may be needed.  Overdosage: If you think you have taken too much of this medicine contact a poison control center or emergency room at once.  NOTE: This medicine is only for you. Do not share this medicine with others.  What if I miss a dose?  If you miss a dose, take it as soon as you can. If it is almost time for your next dose, take only that dose. Do not take double or extra doses.  What may interact with this medicine?  Do not take this medicine with any of the following medications:  -estrogens, like hormone replacement therapy or birth control pills  This medicine may also interact with the following medications:  -dietary supplements such as androstenedione or DHEA  -prasterone  -tamoxifen  This list may not describe all possible interactions. Give your health care provider  a list of all the medicines, herbs, non-prescription drugs, or dietary supplements you use. Also tell them if you smoke, drink alcohol, or use illegal drugs. Some items may interact with your medicine.  What should I watch for while using this medicine?  Visit your doctor or health care professional for regular check-ups to monitor your condition.  Do not use this drug if you are pregnant. Serious side effects to an unborn child are possible. Talk to your doctor or pharmacist for more information.  You may get drowsy or dizzy. Do not drive, use machinery, or do anything that needs mental alertness until you know how this medicine affects you. Do not stand or sit up quickly, especially if you are an older patient. This reduces the risk of dizzy or fainting spells.  What side effects may I notice from receiving this medicine?  Side effects that you should report to your doctor or health care professional as soon as possible:  -allergic reactions like skin rash, itching, or hives  -bone fracture  -chest pain  -difficulty breathing or shortness of breath  -severe pain, swelling, warmth in the leg  -unusually weak or tired  -vaginal bleeding  Side effects that usually do not require medical attention (report to your doctor or health care professional if they continue or are bothersome):  -bone, back, joint, or muscle pain  -dizziness  -fatigue  -fluid retention  -headache  -hot flashes, night sweats  -nausea  -weight gain  This   list may not describe all possible side effects. Call your doctor for medical advice about side effects. You may report side effects to FDA at 1-800-FDA-1088.  Where should I keep my medicine?  Keep out of the reach of children.  Store between 15 and 30 degrees C (59 and 86 degrees F). Throw away any unused medicine after the expiration date.  NOTE: This sheet is a summary. It may not cover all possible information. If you have questions about this medicine, talk to your doctor, pharmacist, or  health care provider.   2014, [employer]vier/[employer] Standard. (2007-09-02 16:43:44)

## 2013-11-07 NOTE — Progress Notes (Signed)
Patient ID: Deanna BuffLetitia M Walker, female   DOB: Dec 13, 1980, 33 y.o.   MRN: [id number]  Chief Complaint  Patient presents with  . 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 female.  H/O PCOS.  Unable to conceive with current partner.  HPI  Past Medical History  Diagnosis Date  . Abnormal Pap smear 2011    colpo and biopsy done, normal paps since  . PCOS (polycystic ovarian syndrome)   . Boil   . Boil, axilla   . Boil of buttock   . Depression   . Diabetes mellitus   . Obesity     Past Surgical History  Procedure Laterality Date  . Abcess drainage    . Colposcopy      Family History  Problem Relation Age of Onset  . Diabetes Maternal Aunt   . Hypertension Maternal Aunt     Social History History  Substance Use Topics  . Smoking status: Never Smoker   . Smokeless tobacco: Never Used  . Alcohol Use: 0.0 oz/week     Comment: occasionally     No Known Allergies  Current Outpatient Prescriptions  Medication Sig Dispense Refill  . diphenhydrAMINE (BENADRYL) 25 mg capsule Take 25 mg by mouth every 6 (six) hours as needed for allergies.      Marland Kitchen. letrozole (FEMARA) 2.5 MG tablet Take 1 tablet (2.5 mg total) by mouth daily. 1 tablet by mouth cycle day 3-7 for 3 months  15 tablet  1  . metFORMIN (GLUCOPHAGE XR) 750 MG 24 hr tablet Take 1 tablet (750 mg total) by mouth 2 (two) times daily.  60 tablet  5   No current facility-administered medications for this visit.    Review of Systems Review of Systems Constitutional: negative for fatigue and weight loss Respiratory: negative for cough and wheezing Cardiovascular: negative for chest pain, fatigue and palpitations Gastrointestinal: negative for abdominal pain and change in bowel habits Genitourinary:negative Integument/breast: negative for nipple discharge Musculoskeletal:negative for myalgias Neurological: negative for gait problems and tremors Behavioral/Psych:  negative for abusive relationship, depression Endocrine: negative for temperature intolerance     Blood pressure 123/75, pulse 64, temperature 97.8 F (36.6 C), height 5\' 5"  (1.651 m), weight 87.544 kg (193 lb), last menstrual period 04/07/2013.  Physical Exam Physical Exam   50% of 15 min visit spent on counseling and coordination of care.    Assessment   Oligo ovulation, PCOS Pre-diabetes     Plan     Meds ordered this encounter  Medications  . letrozole (FEMARA) 2.5 MG tablet    Sig: Take 1 tablet (2.5 mg total) by mouth daily. 1 tablet by mouth cycle day 3-7 for 3 months    Dispense:  15 tablet    Refill:  1  . metFORMIN (GLUCOPHAGE XR) 750 MG 24 hr tablet    Sig: Take 1 tablet (750 mg total) by mouth 2 (two) times daily.    Dispense:  60 tablet    Refill:  5   Counseled re: medication usage Follow up as needed or in 6 weeks         Antionette CharLisa Jackson-Moore 11/07/2013, 1:16 PM

## 2013-11-12 ENCOUNTER — Encounter: Payer: Self-pay | Admitting: Obstetrics & Gynecology

## 2013-11-12 DIAGNOSIS — R7303 Prediabetes: Secondary | ICD-10-CM | POA: Insufficient documentation

## 2013-11-22 ENCOUNTER — Telehealth: Payer: Self-pay | Admitting: *Deleted

## 2013-11-22 NOTE — Telephone Encounter (Signed)
Pt would like to know how to manage Metformin use.  Pt was given Rx on 11/06/13. Pt states that she was waiting for menstral cycle to start, it has not started as of yet.  Pt would like to know if there is anything else she should do before starting medication.  Call received on 11/21/13 at 2:56 pm.

## 2013-11-23 NOTE — Telephone Encounter (Signed)
Does not have to wait for menses

## 2013-11-28 NOTE — Telephone Encounter (Signed)
Call placed to pt in regards to Metformin use.  Pt states that she has been taking the Metformin for about 1 month and she has still not had a cycle. Pt states that she is quite concerned that she has not had a cycle as of yet.  Pt states that her last cycle was in September 2014.  Pt question use of Provera to start her cycle and then to be able to start on Femara at that time.  Pt has f/u appt in June and would like to know what we can do for her before that time. Pt would also like to know what is best for her to use for unwanted hair growth on face and other areas.  Please advise.

## 2013-11-29 NOTE — Telephone Encounter (Signed)
Attempt made to contact pt to advise of recommendations from [facility].  No answer, no voicemail.

## 2013-11-29 NOTE — Telephone Encounter (Signed)
Need to start Provera for a period.  Can call in rx for Provera 10 mg qd x 12 d.  Discuss hirsutism at next visit.  ?UPT

## 2013-11-30 ENCOUNTER — Other Ambulatory Visit: Payer: Self-pay | Admitting: *Deleted

## 2013-11-30 DIAGNOSIS — E282 Polycystic ovarian syndrome: Secondary | ICD-10-CM

## 2013-11-30 MED ORDER — MEDROXYPROGESTERONE ACETATE 10 MG PO TABS: 10.0000 mg | ORAL_TABLET | Freq: Every day | ORAL | Status: DC

## 2013-11-30 NOTE — Telephone Encounter (Signed)
Patient notified per VM - instruction given to do UPT prior to using medication. Will discuss other concerns at appointment.

## 2013-12-15 ENCOUNTER — Telehealth: Payer: Self-pay | Admitting: *Deleted

## 2013-12-15 NOTE — Telephone Encounter (Signed)
Pt called in to office asking for a return call. Return call placed to pt on 12/15/13 at 4:35, no answer, no voicemail.

## 2013-12-18 ENCOUNTER — Ambulatory Visit: Payer: Medicaid Other | Admitting: Obstetrics & Gynecology

## 2013-12-21 ENCOUNTER — Telehealth: Payer: Self-pay | Admitting: *Deleted

## 2013-12-21 NOTE — Telephone Encounter (Signed)
Patient is calling with questions about medication. Patient states she had a cycle 12/01/2013 that lasted for 2 days. Patient states she took her Femara at the correct time and she needs to have her cycle day 21 lab drawn. Appointment for lab made and patient to follow up with Dr Tamela OddiJackson Moore in July. Patient to call for refill of provera if her cycle does not start at the end of the month and she has a negative UPT.

## 2013-12-22 ENCOUNTER — Other Ambulatory Visit: Payer: Medicaid Other

## 2013-12-25 ENCOUNTER — Other Ambulatory Visit: Payer: Medicaid Other

## 2013-12-25 DIAGNOSIS — E282 Polycystic ovarian syndrome: Secondary | ICD-10-CM

## 2013-12-25 DIAGNOSIS — N970 Female infertility associated with anovulation: Secondary | ICD-10-CM

## 2013-12-25 NOTE — Telephone Encounter (Signed)
No additional call from pt re: this encounter.

## 2013-12-26 LAB — PROGESTERONE: Progesterone: 18.5 ng/mL

## 2014-01-04 ENCOUNTER — Telehealth: Payer: Self-pay | Admitting: *Deleted

## 2014-01-04 NOTE — Telephone Encounter (Signed)
Would like to speak to a nurse. Has questions. 01/04/2014 3:30 Phone not receiving calls- busy signal.

## 2014-01-08 ENCOUNTER — Ambulatory Visit: Payer: Medicaid Other | Admitting: Obstetrics & Gynecology

## 2014-01-17 NOTE — Telephone Encounter (Signed)
Patient has not called back- call re filed.

## 2014-01-19 ENCOUNTER — Ambulatory Visit: Payer: Medicaid Other | Admitting: *Deleted

## 2014-02-13 ENCOUNTER — Telehealth: Payer: Self-pay | Admitting: *Deleted

## 2014-02-13 NOTE — Telephone Encounter (Signed)
Patient contacted the office and left a message stating she has some questions.  Attempted to contact patient and left message for patient to contact the office.

## 2014-02-28 ENCOUNTER — Ambulatory Visit: Payer: Medicaid Other | Admitting: Dietician

## 2014-03-14 ENCOUNTER — Ambulatory Visit (INDEPENDENT_AMBULATORY_CARE_PROVIDER_SITE_OTHER): Payer: Medicaid Other | Admitting: Obstetrics & Gynecology

## 2014-03-14 DIAGNOSIS — N970 Female infertility associated with anovulation: Secondary | ICD-10-CM

## 2014-03-18 ENCOUNTER — Encounter: Payer: Self-pay | Admitting: Obstetrics & Gynecology

## 2014-03-18 NOTE — Progress Notes (Signed)
Pt not seen.

## 2014-04-12 ENCOUNTER — Encounter (HOSPITAL_COMMUNITY): Payer: Self-pay | Admitting: Emergency Medicine

## 2014-04-12 ENCOUNTER — Emergency Department (HOSPITAL_COMMUNITY)
Admission: EM | Admit: 2014-04-12 | Discharge: 2014-04-12 | Disposition: A | Discharge status: Home / Self Care | Payer: Medicaid Other | Source: Home / Self Care

## 2014-04-12 ENCOUNTER — Ambulatory Visit: Payer: Medicaid Other | Admitting: Dietician

## 2014-04-12 DIAGNOSIS — L508 Other urticaria: Secondary | ICD-10-CM

## 2014-04-12 DIAGNOSIS — R1030 Lower abdominal pain, unspecified: Secondary | ICD-10-CM

## 2014-04-12 DIAGNOSIS — R350 Frequency of micturition: Secondary | ICD-10-CM

## 2014-04-12 LAB — POCT URINALYSIS DIP (DEVICE)
BILIRUBIN URINE: NEGATIVE
GLUCOSE, UA: NEGATIVE mg/dL
Hgb urine dipstick: NEGATIVE
KETONES UR: NEGATIVE mg/dL
Leukocytes, UA: NEGATIVE
Nitrite: NEGATIVE
PROTEIN: NEGATIVE mg/dL
SPECIFIC GRAVITY, URINE: 1.025 (ref 1.005–1.030)
Urobilinogen, UA: 0.2 mg/dL (ref 0.0–1.0)
pH: 5.5 (ref 5.0–8.0)

## 2014-04-12 LAB — POCT PREGNANCY, URINE: PREG TEST UR: NEGATIVE

## 2014-04-12 LAB — GLUCOSE, CAPILLARY: GLUCOSE-CAPILLARY: 148 mg/dL — AB (ref 70–99)

## 2014-04-12 NOTE — ED Provider Notes (Signed)
Medical screening examination/treatment/procedure(s) were performed by non-physician practitioner and as supervising physician I was immediately available for consultation/collaboration.  Tymesha Ditmore, M.D.  Kamylah Manzo C Mackenze Grandison, MD 04/12/14 1611 

## 2014-04-12 NOTE — Discharge Instructions (Signed)
Urinary Frequency °The number of times a normal person urinates depends upon how much liquid they take in and how much liquid they are losing. If the temperature is hot and there is high humidity, then the person will sweat more and usually breathe a little more frequently. These factors decrease the amount of frequency of urination that would be considered normal. °The amount you drink is easily determined, but the amount of fluid lost is sometimes more difficult to calculate.  °Fluid is lost in two ways: °· Sensible fluid loss is usually measured by the amount of urine that you get rid of. Losses of fluid can also occur with diarrhea. °· Insensible fluid loss is more difficult to measure. It is caused by evaporation. Insensible loss of fluid occurs through breathing and sweating. It usually ranges from a little less than a quart to a little more than a quart of fluid a day. °In normal temperatures and activity levels, the average person may urinate 4 to 7 times in a 24-hour period. Needing to urinate more often than that could indicate a problem. If one urinates 4 to 7 times in 24 hours and has large volumes each time, that could indicate a different problem from one who urinates 4 to 7 times a day and has small volumes. The time of urinating is also important. Most urinating should be done during the waking hours. Getting up at night to urinate frequently can indicate some problems. °CAUSES  °The bladder is the organ in your lower abdomen that holds urine. Like a balloon, it swells some as it fills up. Your nerves sense this and tell you it is time to head for the bathroom. There are a number of reasons that you might feel the need to urinate more often than usual. They include: °· Urinary tract infection. This is usually associated with other signs such as burning when you urinate. °· In men, problems with the prostate (a walnut-size gland that is located near the tube that carries urine out of your body). There  are two reasons why the prostate can cause an increased frequency of urination: °¨ An enlarged prostate that does not let the bladder empty well. If the bladder only half empties when you urinate, then it only has half the capacity to fill before you have to urinate again. °¨ The nerves in the bladder become more hypersensitive with an increased size of the prostate even if the bladder empties completely. °· Pregnancy. °· Obesity. Excess weight is more likely to cause a problem for women than for men. °· Bladder stones or other bladder problems. °· Caffeine. °· Alcohol. °· Medications. For example, drugs that help the body get rid of extra fluid (diuretics) increase urine production. Some other medicines must be taken with lots of fluids. °· Muscle or nerve weakness. This might be the result of a spinal cord injury, a stroke, multiple sclerosis, or Parkinson disease. °· Long-standing diabetes can decrease the sensation of the bladder. This loss of sensation makes it harder to sense the bladder needs to be emptied. Over a period of years, the bladder is stretched out by constant overfilling. This weakens the bladder muscles so that the bladder does not empty well and has less capacity to fill with new urine. °· Interstitial cystitis (also called painful bladder syndrome). This condition develops because the tissues that line the inside of the bladder are inflamed (inflammation is the body's way of reacting to injury or infection). It causes pain and frequent   urination. It occurs in women more often than in men. DIAGNOSIS   To decide what might be causing your urinary frequency, your health care provider will probably:  Ask about symptoms you have noticed.  Ask about your overall health. This will include questions about any medications you are taking.  Do a physical examination.  Order some tests. These might include:  A blood test to check for diabetes or other health issues that could be contributing  to the problem.  Urine testing. This could measure the flow of urine and the pressure on the bladder.  A test of your neurological system (the brain, spinal cord, and nerves). This is the system that senses the need to urinate.  A bladder test to check whether it is emptying completely when you urinate.  Cystoscopy. This test uses a thin tube with a tiny camera on it. It offers a look inside your urethra and bladder to see if there are problems.  Imaging tests. You might be given a contrast dye and then asked to urinate. X-rays are taken to see how your bladder is working. TREATMENT  It is important for you to be evaluated to determine if the amount or frequency that you have is unusual or abnormal. If it is found to be abnormal, the cause should be determined and this can usually be found out easily. Depending upon the cause, treatment could include medication, stimulation of the nerves, or surgery. There are not too many things that you can do as an individual to change your urinary frequency. It is important that you balance the amount of fluid intake needed to compensate for your activity and the temperature. Medical problems will be diagnosed and taken care of by your physician. There is no particular bladder training such as Kegel exercises that you can do to help urinary frequency. This is an exercise that is usually recommended for people who have leaking of urine when they laugh, cough, or sneeze. HOME CARE INSTRUCTIONS   Take any medications your health care provider prescribed or suggested. Follow the directions carefully.  Practice any lifestyle changes that are recommended. These might include:  Drinking less fluid or drinking at different times of the day. If you need to urinate often during the night, for example, you may need to stop drinking fluids early in the evening.  Cutting down on caffeine or alcohol. They both can make you need to urinate more often than normal. Caffeine  is found in coffee, tea, and sodas.  Losing weight, if that is recommended.  Keep a journal or a log. You might be asked to record how much you drink and when and where you feel the need to urinate. This will also help evaluate how well the treatment provided by your physician is working. SEEK MEDICAL CARE IF:   Your need to urinate often gets worse.  You feel increased pain or irritation when you urinate.  You notice blood in your urine.  You have questions about any medications that your health care provider recommended.  You notice blood, pus, or swelling at the site of any test or treatment procedure.  You develop a fever of more than 100.5F (38.1C). SEEK IMMEDIATE MEDICAL CARE IF:  You develop a fever of more than 102.0F (38.9C). Document Released: 04/18/2009 Document Revised: 11/06/2013 Document Reviewed: 04/18/2009 [employer] Patient Information 2015 [employer]. This information is not intended to replace advice given to you by your health care provider. Make sure you discuss any questions you   have with your health care provider.  Hives Hives are itchy, red, swollen areas of the skin. They can vary in size and location on your body. Hives can come and go for hours or several days (acute hives) or for several weeks (chronic hives). Hives do not spread from person to person (noncontagious). They may get worse with scratching, exercise, and emotional stress. CAUSES   Allergic reaction to food, additives, or drugs.  Infections, including the common cold.  Illness, such as vasculitis, lupus, or thyroid disease.  Exposure to sunlight, heat, or cold.  Exercise.  Stress.  Contact with chemicals. SYMPTOMS   Red or white swollen patches on the skin. The patches may change size, shape, and location quickly and repeatedly.  Itching.  Swelling of the hands, feet, and face. This may occur if hives develop deeper in the skin. DIAGNOSIS  Your caregiver can usually tell  what is wrong by performing a physical exam. Skin or blood tests may also be done to determine the cause of your hives. In some cases, the cause cannot be determined. TREATMENT  Mild cases usually get better with medicines such as antihistamines. Severe cases may require an emergency epinephrine injection. If the cause of your hives is known, treatment includes avoiding that trigger.  HOME CARE INSTRUCTIONS   Avoid causes that trigger your hives.  Take antihistamines as directed by your caregiver to reduce the severity of your hives. Non-sedating or low-sedating antihistamines are usually recommended. Do not drive while taking an antihistamine.  Take any other medicines prescribed for itching as directed by your caregiver.  Wear loose-fitting clothing.  Keep all follow-up appointments as directed by your caregiver. SEEK MEDICAL CARE IF:   You have persistent or severe itching that is not relieved with medicine.  You have painful or swollen joints. SEEK IMMEDIATE MEDICAL CARE IF:   You have a fever.  Your tongue or lips are swollen.  You have trouble breathing or swallowing.  You feel tightness in the throat or chest.  You have abdominal pain. These problems may be the first sign of a life-threatening allergic reaction. Call your local emergency services (911 in [place] MAKE SURE YOU:   Understand these instructions.  Will watch your condition.  Will get help right away if you are not doing well or get worse. Document Released: 06/22/2005 Document Revised: 06/27/2013 Document Reviewed: 09/15/2011 [facility] Patient Information 2015 [place] [place]. This information is not intended to replace advice given to you by your health care provider. Make sure you discuss any questions you have with your health care provider.  Pain of Unknown Etiology (Pain Without a Known Cause) You have come to your caregiver because of pain. Pain can occur in any part of the body. Often there is not a  definite cause. If your laboratory (blood or urine) work was normal and X-rays or other studies were normal, your caregiver may treat you without knowing the cause of the pain. An example of this is the headache. Most headaches are diagnosed by taking a history. This means your caregiver asks you questions about your headaches. Your caregiver determines a treatment based on your answers. Usually testing done for headaches is normal. Often testing is not done unless there is no response to medications. Regardless of where your pain is located today, you can be given medications to make you comfortable. If no physical cause of pain can be found, most cases of pain will gradually leave as suddenly as they came.  If you  have a painful condition and no reason can be found for the pain, it is important that you follow up with your caregiver. If the pain becomes worse or does not go away, it may be necessary to repeat tests and look further for a possible cause.  Only take over-the-counter or prescription medicines for pain, discomfort, or fever as directed by your caregiver.  For the protection of your privacy, test results cannot be given over the phone. Make sure you receive the results of your test. Ask how these results are to be obtained if you have not been informed. It is your responsibility to obtain your test results.  You may continue all activities unless the activities cause more pain. When the pain lessens, it is important to gradually resume normal activities. Resume activities by beginning slowly and gradually increasing the intensity and duration of the activities or exercise. During periods of severe pain, bed rest may be helpful. Lie or sit in any position that is comfortable.  Ice used for acute (sudden) conditions may be effective. Use a large plastic bag filled with ice and wrapped in a towel. This may provide pain relief.  See your caregiver for continued problems. Your caregiver can help  or refer you for exercises or physical therapy if necessary. If you were given medications for your condition, do not drive, operate machinery or power tools, or sign legal documents for 24 hours. Do not drink alcohol, take sleeping pills, or take other medications that may interfere with treatment. See your caregiver immediately if you have pain that is becoming worse and not relieved by medications. Document Released: 03/17/2001 Document Revised: 04/12/2013 Document Reviewed: 06/22/2005 [facility] [employer] Patient Information 2015 [place], [place]. This information is not intended to replace advice given to you by your health care provider. Make sure you discuss any questions you have with your health care provider.

## 2014-04-12 NOTE — ED Notes (Signed)
Pt is here today because of some itching that she has all over her body that when she does itch the areas it leads to visible marks, pt also says that she has had some abdominal pain and urinary frequency, pt is also requesting a pregnancy test to be done today as well

## 2014-04-12 NOTE — ED Provider Notes (Signed)
CSN: [id number]     Arrival date & time 04/12/14  1447 History   First MD Initiated Contact with Patient 04/12/14 1506     Chief Complaint  Patient presents with  . Pruritis   (Consider location/radiation/quality/duration/timing/severity/associated sxs/prior Treatment) HPI Comments: C/O intermittent rash for 3 months. Comes and goes, very pruritic. She describes wheals that appear in various areas, go away and reoccur in other areas. There are longitudinal pink wheals to the L anterior chest beneath the L breast now.  Describes apartment as old with mold. She does not work. Abdominal pain described as sharp, shooting, intermittient from the umbilicus at the midline to the pelvis. This started 3 yrs ago and has been discussed with her PCP. C/O urinary frequency, no dysuria for " a while".  She is trying to get pregnant.   Past Medical History  Diagnosis Date  . Abnormal Pap smear 2011    colpo and biopsy done, normal paps since  . PCOS (polycystic ovarian syndrome)   . Boil   . Boil, axilla   . Boil of buttock   . Depression   . Diabetes mellitus   . Obesity    Past Surgical History  Procedure Laterality Date  . Abcess drainage    . Colposcopy     Family History  Problem Relation Age of Onset  . Diabetes Maternal Aunt   . Hypertension Maternal Aunt    History  Substance Use Topics  . Smoking status: Never Smoker   . Smokeless tobacco: Never Used  . Alcohol Use: 0.0 oz/week     Comment: occasionally    OB History   Grav Para Term Preterm Abortions TAB SAB Ect Mult Living   0 0             Review of Systems  Constitutional: Negative for fever, activity change and fatigue.  HENT: Negative.   Respiratory: Negative for cough and shortness of breath.   Cardiovascular: Negative for chest pain and palpitations.  Gastrointestinal: Positive for abdominal pain. Negative for vomiting, diarrhea and constipation.  Genitourinary: Positive for frequency. Negative for dysuria,  vaginal discharge and pelvic pain.  Skin: Positive for rash.  Neurological: Negative for dizziness, syncope and speech difficulty.    Allergies  Review of patient's allergies indicates no known allergies.  Home Medications   Prior to Admission medications   Medication Sig Start Date End Date Taking? Authorizing Provider  diphenhydrAMINE (BENADRYL) 25 mg capsule Take 25 mg by mouth every 6 (six) hours as needed for allergies.    Historical Provider, MD  letrozole (FEMARA) 2.5 MG tablet Take 1 tablet (2.5 mg total) by mouth daily. 1 tablet by mouth cycle day 3-7 for 3 months 11/06/13   Antionette CharLisa Jackson-Moore, MD  medroxyPROGESTERone (PROVERA) 10 MG tablet Take 1 tablet (10 mg total) by mouth daily. 11/30/13   Antionette CharLisa Jackson-Moore, MD  metFORMIN (GLUCOPHAGE XR) 750 MG 24 hr tablet Take 1 tablet (750 mg total) by mouth 2 (two) times daily. 11/06/13   Antionette CharLisa Jackson-Moore, MD   BP 116/73  Pulse 75  Temp(Src) 98.7 F (37.1 C) (Oral)  SpO2 100%  LMP 03/29/2014 Physical Exam  Nursing note and vitals reviewed. Constitutional: She is oriented to person, place, and time. She appears well-developed and well-nourished. No distress.  Eyes: Conjunctivae and EOM are normal.  Neck: Normal range of motion. Neck supple.  Cardiovascular: Normal rate.   Pulmonary/Chest: Effort normal. No respiratory distress.  Abdominal: Soft. Bowel sounds are normal. She exhibits no distension  and no mass. There is no tenderness. There is no rebound and no guarding.  Musculoskeletal: Normal range of motion. She exhibits no edema and no tenderness.  Neurological: She is alert and oriented to person, place, and time.  Skin: Skin is warm and dry. Rash noted.  Psychiatric: She has a normal mood and affect.    ED Course  Procedures (including critical care time) Labs Review Labs Reviewed  GLUCOSE, CAPILLARY - Abnormal; Notable for the following:    Glucose-Capillary 148 (*)    All other components within normal limits  URINE  CULTURE  POCT URINALYSIS DIP (DEVICE)  POCT PREGNANCY, URINE   Results for orders placed during the hospital encounter of 04/12/14  GLUCOSE, CAPILLARY      Result Value Ref Range   Glucose-Capillary 148 (*) 70 - 99 mg/dL  POCT URINALYSIS DIP (DEVICE)      Result Value Ref Range   Glucose, UA NEGATIVE  NEGATIVE mg/dL   Bilirubin Urine NEGATIVE  NEGATIVE   Ketones, ur NEGATIVE  NEGATIVE mg/dL   Specific Gravity, Urine 1.025  1.005 - 1.030   Hgb urine dipstick NEGATIVE  NEGATIVE   pH 5.5  5.0 - 8.0   Protein, ur NEGATIVE  NEGATIVE mg/dL   Urobilinogen, UA 0.2  0.0 - 1.0 mg/dL   Nitrite NEGATIVE  NEGATIVE   Leukocytes, UA NEGATIVE  NEGATIVE  POCT PREGNANCY, URINE      Result Value Ref Range   Preg Test, Ur NEGATIVE  NEGATIVE    Imaging Review No results found.   MDM   1. Urticaria, chronic   2. Urinary frequency   3. Lower abdominal pain    F/u with PCP Recommend allergy testing Continue antihistamines, try allegra or zyrtec Urine cult pending     Hayden Rasmussen, NP 04/12/14 1547

## 2014-04-14 LAB — URINE CULTURE
COLONY COUNT: NO GROWTH
CULTURE: NO GROWTH
Special Requests: NORMAL

## 2014-04-17 ENCOUNTER — Ambulatory Visit: Payer: Medicaid Other | Admitting: Dietician

## 2014-05-21 ENCOUNTER — Ambulatory Visit: Payer: Medicaid Other | Admitting: Dietician

## 2014-07-02 ENCOUNTER — Encounter: Payer: Self-pay | Admitting: *Deleted

## 2014-07-03 ENCOUNTER — Encounter: Payer: Self-pay | Admitting: Obstetrics & Gynecology

## 2014-07-19 ENCOUNTER — Telehealth: Payer: Self-pay | Admitting: Obstetrics

## 2014-08-01 NOTE — Telephone Encounter (Signed)
[phone] - Called patient and she wishes to wait for new Provider to schedule appt.. Placed all info in book to call patient when information available. brm

## 2014-08-15 ENCOUNTER — Telehealth: Payer: Self-pay | Admitting: Obstetrics

## 2014-08-16 NOTE — Telephone Encounter (Signed)
Patient request a refill on her Femera. Please review and advise

## 2014-08-17 NOTE — Telephone Encounter (Signed)
Review.

## 2014-08-28 ENCOUNTER — Ambulatory Visit: Payer: Medicaid Other | Admitting: Certified Nurse Midwife

## 2014-09-06 NOTE — Telephone Encounter (Signed)
Patient needs a follow up appointment if she wishes to continue Femara. [facility]

## 2014-10-26 ENCOUNTER — Ambulatory Visit: Payer: Medicaid Other | Admitting: Certified Nurse Midwife

## 2014-11-06 ENCOUNTER — Telehealth: Payer: Self-pay | Admitting: *Deleted

## 2014-11-06 NOTE — Telephone Encounter (Signed)
Patient states she has not had a cycle in 2 months and she wants to know if she should restart her metformin. Told patient that since she has an appointment in 2 days she should wait and speak to the provider about what she should be doing to archive her goals.

## 2014-11-09 ENCOUNTER — Encounter: Payer: Self-pay | Admitting: Certified Nurse Midwife

## 2014-11-09 ENCOUNTER — Ambulatory Visit (INDEPENDENT_AMBULATORY_CARE_PROVIDER_SITE_OTHER): Payer: Medicaid Other | Admitting: Certified Nurse Midwife

## 2014-11-09 VITALS — BP 122/74 | HR 82 | Temp 97.9°F | Ht 65.0 in | Wt 196.0 lb

## 2014-11-09 DIAGNOSIS — E282 Polycystic ovarian syndrome: Secondary | ICD-10-CM

## 2014-11-09 MED ORDER — METFORMIN HCL 1000 MG PO TABS
1000.0000 mg | ORAL_TABLET | Freq: Two times a day (BID) | ORAL | Status: DC
Start: 2014-11-09 — End: 2016-01-02

## 2014-11-09 MED ORDER — LETROZOLE 2.5 MG PO TABS: 2.5000 mg | ORAL_TABLET | Freq: Every day | ORAL | Status: DC

## 2014-11-09 NOTE — Progress Notes (Signed)
Patient ID: Deanna BuffLetitia M Willcox, female   DOB: [birth year], 34 y.o.   MRN: [id number]   Chief Complaint  Patient presents with  . Follow-up    HPI Deanna Walker is a 34 y.o. female.  C/O anovulation and desires pregnancy.  Has hx of PCOS on Metformin, blood sugars are still running high. Has not been able to loose weight.  In stable relationship with spouse and has been trying for pregnancy for 10 years without success.      HPI  Past Medical History  Diagnosis Date  . Abnormal Pap smear 2011    colpo and biopsy done, normal paps since  . PCOS (polycystic ovarian syndrome)   . Boil   . Boil, axilla   . Boil of buttock   . Depression   . Diabetes mellitus   . Obesity     Past Surgical History  Procedure Laterality Date  . Abcess drainage    . Colposcopy      Family History  Problem Relation Age of Onset  . Diabetes Maternal Aunt   . Hypertension Maternal Aunt     Social History History  Substance Use Topics  . Smoking status: Never Smoker   . Smokeless tobacco: Never Used  . Alcohol Use: 0.0 oz/week    0 Standard drinks or equivalent per week     Comment: occasionally     No Known Allergies  Current Outpatient Prescriptions  Medication Sig Dispense Refill  . diphenhydrAMINE (BENADRYL) 25 mg capsule Take 25 mg by mouth every 6 (six) hours as needed for allergies.    Marland Kitchen. letrozole (FEMARA) 2.5 MG tablet Take 1 tablet (2.5 mg total) by mouth daily. 1 tablet by mouth cycle day 3-7 for 3 months 15 tablet 1  . medroxyPROGESTERone (PROVERA) 10 MG tablet Take 1 tablet (10 mg total) by mouth daily. (Patient not taking: Reported on 11/09/2014) 12 tablet 0  . metFORMIN (GLUCOPHAGE) 1000 MG tablet Take 1 tablet (1,000 mg total) by mouth 2 (two) times daily with a meal. 60 tablet 6   No current facility-administered medications for this visit.    Review of Systems Review of Systems Constitutional: negative for fatigue and weight loss Respiratory: negative for cough and  wheezing Cardiovascular: negative for chest pain, fatigue and palpitations Gastrointestinal: negative for abdominal pain and change in bowel habits Genitourinary:negative Integument/breast: negative for nipple discharge Musculoskeletal:negative for myalgias Neurological: negative for gait problems and tremors Behavioral/Psych: negative for abusive relationship, depression Endocrine: negative for temperature intolerance     Blood pressure 122/74, pulse 82, temperature 97.9 F (36.6 C), height 5\' 5"  (1.651 m), weight 88.905 kg (196 lb).  Physical Exam Physical Exam General:   alert  Skin:   no rash or abnormalities  Lungs:   clear to auscultation bilaterally  Heart:   regular rate and rhythm, S1, S2 normal, no murmur, click, rub or gallop  Breasts:   deferred  Abdomen:  normal findings: no organomegaly, soft, non-tender and no hernia  Pelvis:  External genitalia: normal general appearance Urinary system: urethral meatus normal and bladder without fullness, nontender Vaginal: normal without tenderness, induration or masses Cervix: normal appearance Adnexa: normal bimanual exam Uterus: anteverted and non-tender, normal size    90% of 15 min visit spent on counseling and coordination of care.   Data Reviewed Previous medical hx, labs  Assessment     Infertility PCOS Obesity Preconception counseling     Plan    Orders Placed This Encounter  Procedures  .  Referral to Nutrition and Diabetes Services    Referral Priority:  Routine    Referral Type:  Consultation    Referral Reason:  Specialty Services Required    Number of Visits Requested:  1  . Ambulatory referral to Infertility    Referral Priority:  Routine    Referral Type:  Consultation    Referral Reason:  Specialty Services Required    Requested Specialty:  Obstetrics    Number of Visits Requested:  1   Meds ordered this encounter  Medications  . letrozole (FEMARA) 2.5 MG tablet    Sig: Take 1 tablet (2.5 mg  total) by mouth daily. 1 tablet by mouth cycle day 3-7 for 3 months    Dispense:  15 tablet    Refill:  1  . metFORMIN (GLUCOPHAGE) 1000 MG tablet    Sig: Take 1 tablet (1,000 mg total) by mouth 2 (two) times daily with a meal.    Dispense:  60 tablet    Refill:  6

## 2014-11-12 ENCOUNTER — Telehealth: Payer: Self-pay | Admitting: *Deleted

## 2014-11-12 NOTE — Telephone Encounter (Signed)
Patient states she is missing Rx from her visit - she needs a couple more sent in. 4:45 Attempt to call patient and her # states disconnected.

## 2014-11-14 ENCOUNTER — Telehealth: Payer: Self-pay

## 2014-11-14 NOTE — Telephone Encounter (Signed)
[facility] could not contact patient for appt - sent letter to her as both phone numbers do not work

## 2014-12-11 ENCOUNTER — Telehealth: Payer: Self-pay

## 2014-12-11 ENCOUNTER — Ambulatory Visit: Payer: Medicaid Other | Admitting: *Deleted

## 2014-12-11 NOTE — Telephone Encounter (Signed)
Patient scheduled for appt with Dr. Lyndal RainbowYalcinkaya's office for 02/01/15 at 2:45pm and knows of appt. [place]

## 2014-12-12 ENCOUNTER — Telehealth: Payer: Self-pay

## 2014-12-12 NOTE — Telephone Encounter (Signed)
SPOKE WITH PATIENT, SHE IS AWARE THAT MEDICAID DOES NOT COVER FERTILITY - [facility] RESCH TO AUGUST 31ST AT 2PM - SHE ALSO IS AWARE IT IS SELF-PAY

## 2015-01-01 ENCOUNTER — Ambulatory Visit: Payer: Medicaid Other | Admitting: Certified Nurse Midwife

## 2015-01-11 ENCOUNTER — Ambulatory Visit: Payer: Medicaid Other | Admitting: Certified Nurse Midwife

## 2015-01-16 ENCOUNTER — Ambulatory Visit: Payer: Medicaid Other | Admitting: Certified Nurse Midwife

## 2015-01-23 ENCOUNTER — Ambulatory Visit: Payer: Medicaid Other | Admitting: Certified Nurse Midwife

## 2015-01-30 ENCOUNTER — Ambulatory Visit: Payer: Medicaid Other | Admitting: Certified Nurse Midwife

## 2015-01-31 ENCOUNTER — Ambulatory Visit: Payer: Medicaid Other | Admitting: Certified Nurse Midwife

## 2015-02-08 ENCOUNTER — Encounter: Payer: Self-pay | Admitting: Certified Nurse Midwife

## 2015-02-08 ENCOUNTER — Ambulatory Visit (INDEPENDENT_AMBULATORY_CARE_PROVIDER_SITE_OTHER): Payer: Medicaid Other | Admitting: Certified Nurse Midwife

## 2015-02-08 VITALS — BP 128/62 | HR 90 | Temp 98.8°F | Ht 65.0 in | Wt 191.0 lb

## 2015-02-08 DIAGNOSIS — Z3200 Encounter for pregnancy test, result unknown: Secondary | ICD-10-CM

## 2015-02-08 DIAGNOSIS — Z01419 Encounter for gynecological examination (general) (routine) without abnormal findings: Secondary | ICD-10-CM

## 2015-02-08 DIAGNOSIS — Z113 Encounter for screening for infections with a predominantly sexual mode of transmission: Secondary | ICD-10-CM

## 2015-02-08 DIAGNOSIS — Z3202 Encounter for pregnancy test, result negative: Secondary | ICD-10-CM | POA: Diagnosis not present

## 2015-02-08 DIAGNOSIS — Z0000 Encounter for general adult medical examination without abnormal findings: Secondary | ICD-10-CM | POA: Diagnosis not present

## 2015-02-08 DIAGNOSIS — E282 Polycystic ovarian syndrome: Secondary | ICD-10-CM

## 2015-02-08 LAB — HEMOGLOBIN A1C
Hgb A1c MFr Bld: 6.4 % — ABNORMAL HIGH (ref <5.7)
Mean Plasma Glucose: 137 mg/dL — ABNORMAL HIGH (ref <117)

## 2015-02-08 LAB — COMPREHENSIVE METABOLIC PANEL
ALT: 21 U/L (ref 6–29)
AST: 19 U/L (ref 10–30)
Albumin: 4.2 g/dL (ref 3.6–5.1)
Alkaline Phosphatase: 64 U/L (ref 33–115)
BUN: 9 mg/dL (ref 7–25)
CHLORIDE: 103 mmol/L (ref 98–110)
CO2: 26 mmol/L (ref 20–31)
Calcium: 9.7 mg/dL (ref 8.6–10.2)
Creat: 0.82 mg/dL (ref 0.50–1.10)
Glucose, Bld: 71 mg/dL (ref 65–99)
Potassium: 4.4 mmol/L (ref 3.5–5.3)
SODIUM: 140 mmol/L (ref 135–146)
Total Bilirubin: 0.2 mg/dL (ref 0.2–1.2)
Total Protein: 7.3 g/dL (ref 6.1–8.1)

## 2015-02-08 LAB — CBC WITH DIFFERENTIAL/PLATELET
BASOS PCT: 0 % (ref 0–1)
Basophils Absolute: 0 10*3/uL (ref 0.0–0.1)
EOS ABS: 0.1 10*3/uL (ref 0.0–0.7)
Eosinophils Relative: 1 % (ref 0–5)
HEMATOCRIT: 41.6 % (ref 36.0–46.0)
Hemoglobin: 13.6 g/dL (ref 12.0–15.0)
LYMPHS PCT: 35 % (ref 12–46)
Lymphs Abs: 3.4 10*3/uL (ref 0.7–4.0)
MCH: 26.3 pg (ref 26.0–34.0)
MCHC: 32.7 g/dL (ref 30.0–36.0)
MCV: 80.3 fL (ref 78.0–100.0)
MONO ABS: 1 10*3/uL (ref 0.1–1.0)
MPV: 10.1 fL (ref 8.6–12.4)
Monocytes Relative: 10 % (ref 3–12)
NEUTROS ABS: 5.2 10*3/uL (ref 1.7–7.7)
Neutrophils Relative %: 54 % (ref 43–77)
Platelets: 364 10*3/uL (ref 150–400)
RBC: 5.18 MIL/uL — ABNORMAL HIGH (ref 3.87–5.11)
RDW: 14.7 % (ref 11.5–15.5)
WBC: 9.7 10*3/uL (ref 4.0–10.5)

## 2015-02-08 LAB — CHOLESTEROL, TOTAL: Cholesterol: 146 mg/dL (ref 125–200)

## 2015-02-08 LAB — HDL CHOLESTEROL: HDL: 26 mg/dL — AB (ref >=46)

## 2015-02-08 LAB — TRIGLYCERIDES: Triglycerides: 113 mg/dL (ref <150)

## 2015-02-08 LAB — POCT URINE PREGNANCY: PREG TEST UR: NEGATIVE

## 2015-02-08 MED ORDER — NORGESTIM-ETH ESTRAD TRIPHASIC 0.18/0.215/0.25 MG-25 MCG PO TABS: 1.0000 | ORAL_TABLET | Freq: Every day | ORAL | Status: DC

## 2015-02-08 MED ORDER — DIPHENHYDRAMINE HCL 25 MG PO TABS: 25.0000 mg | ORAL_TABLET | Freq: Four times a day (QID) | ORAL | Status: DC | PRN

## 2015-02-08 MED ORDER — SPIRONOLACTONE 100 MG PO TABS: 100.0000 mg | ORAL_TABLET | Freq: Every day | ORAL | Status: DC

## 2015-02-08 NOTE — Progress Notes (Signed)
Patient ID: Deanna Walker, female   DOB: 06/15/1981, 34 y.o.   MRN: [id number]    Subjective:      Deanna Walker is a 34 y.o. female here for a routine exam.  Current complaints: irregular periods, spotting occasionally.  LMP: Feb.  Takes Benedryl for allergies.   States that she is exercising.    Personal health questionnaire:  Is patient Ashkenazi Jewish, have a family history of breast and/or ovarian cancer: no Is there a family history of uterine cancer diagnosed at age < 20, gastrointestinal cancer, urinary tract cancer, family member who is a Personnel officer syndrome-associated carrier: no Is the patient overweight and hypertensive, family history of diabetes, personal history of gestational diabetes, preeclampsia or PCOS: yes Is patient over 85, have PCOS,  family history of premature CHD under age 72, diabetes, smoke, have hypertension or peripheral artery disease:  yes At any time, has a partner hit, kicked or otherwise hurt or frightened you?: no Over the past 2 weeks, have you felt down, depressed or hopeless?: no Over the past 2 weeks, have you felt little interest or pleasure in doing things?:sometimes   Gynecologic History Patient's last menstrual period was 08/06/2014 (approximate). Contraception: none Last Pap: unknown. Results were: normal according to the patient Last mammogram: N/A.  Obstetric History OB History  Gravida Para Term Preterm AB SAB TAB Ectopic Multiple Living  0 0                Past Medical History  Diagnosis Date  . Abnormal Pap smear 2011    colpo and biopsy done, normal paps since  . PCOS (polycystic ovarian syndrome)   . Boil   . Boil, axilla   . Boil of buttock   . Depression   . Diabetes mellitus   . Obesity     Past Surgical History  Procedure Laterality Date  . Abcess drainage    . Colposcopy       Current outpatient prescriptions:  .  metFORMIN (GLUCOPHAGE) 1000 MG tablet, Take 1 tablet (1,000 mg total) by mouth 2 (two)  times daily with a meal., Disp: 60 tablet, Rfl: 6 .  diphenhydrAMINE (BENADRYL) 25 MG tablet, Take 1 tablet (25 mg total) by mouth every 6 (six) hours as needed., Disp: 30 tablet, Rfl: 0 .  letrozole (FEMARA) 2.5 MG tablet, Take 1 tablet (2.5 mg total) by mouth daily. 1 tablet by mouth cycle day 3-7 for 3 months (Patient not taking: Reported on 02/08/2015), Disp: 15 tablet, Rfl: 1 .  medroxyPROGESTERone (PROVERA) 10 MG tablet, Take 1 tablet (10 mg total) by mouth daily. (Patient not taking: Reported on 11/09/2014), Disp: 12 tablet, Rfl: 0 .  Norgestimate-Ethinyl Estradiol Triphasic (ORTHO TRI-CYCLEN LO) 0.18/0.215/0.25 MG-25 MCG tab, Take 1 tablet by mouth daily., Disp: 1 Package, Rfl: 11 .  spironolactone (ALDACTONE) 100 MG tablet, Take 1 tablet (100 mg total) by mouth daily., Disp: 30 tablet, Rfl: 6 No Known Allergies  History  Substance Use Topics  . Smoking status: Never Smoker   . Smokeless tobacco: Never Used  . Alcohol Use: 0.0 oz/week    0 Standard drinks or equivalent per week     Comment: occasionally     Family History  Problem Relation Age of Onset  . Diabetes Maternal Aunt   . Hypertension Maternal Aunt       Review of Systems  Constitutional: negative for fatigue and weight loss Respiratory: negative for cough and wheezing Cardiovascular: negative for chest pain, fatigue and  palpitations Gastrointestinal: negative for abdominal pain and change in bowel habits Musculoskeletal:negative for myalgias Neurological: negative for gait problems and tremors Behavioral/Psych: negative for abusive relationship, depression Endocrine: negative for temperature intolerance   Genitourinary:negative for abnormal menstrual periods, genital lesions, hot flashes, sexual problems and vaginal discharge Integument/breast: negative for breast lump, breast tenderness, nipple discharge and skin lesion(s), + irregular menses    Objective:       BP 128/62 mmHg  Pulse 90  Temp(Src) 98.8 F  (37.1 C)  Ht 5\' 5"  (1.651 m)  Wt 191 lb (86.637 kg)  BMI 31.78 kg/m2  LMP 08/06/2014 (Approximate) General:   alert  Skin:   no rash or abnormalities, hirsutism face, back  Lungs:   clear to auscultation bilaterally  Heart:   regular rate and rhythm, S1, S2 normal, no murmur, click, rub or gallop  Breasts:   normal without suspicious masses, skin or nipple changes or axillary nodes  Abdomen:  normal findings: no organomegaly, soft, non-tender and no hernia obese  Pelvis:  External genitalia: normal general appearance Urinary system: urethral meatus normal and bladder without fullness, nontender Vaginal: normal without tenderness, induration or masses Cervix: normal appearance Adnexa: normal bimanual exam Uterus: anteverted and non-tender, normal size   Lab Review Urine pregnancy test Labs reviewed yes Radiologic studies reviewed no  50% of 30 min visit spent on counseling and coordination of care.   Assessment:    Healthy female exam.   Obesity Hirsutism PCOS Infertility Irregular cycles   Plan:  Plan regulate her cycles with OCPs for a few months, then give clomid.    Education reviewed: calcium supplements, depression evaluation, low fat, low cholesterol diet, safe sex/STD prevention, self breast exams, skin cancer screening and weight bearing exercise. Contraception: OCP (estrogen/progesterone). Follow up in: 2 months.   Meds ordered this encounter  Medications  . spironolactone (ALDACTONE) 100 MG tablet    Sig: Take 1 tablet (100 mg total) by mouth daily.    Dispense:  30 tablet    Refill:  6  . diphenhydrAMINE (BENADRYL) 25 MG tablet    Sig: Take 1 tablet (25 mg total) by mouth every 6 (six) hours as needed.    Dispense:  30 tablet    Refill:  0  . Norgestimate-Ethinyl Estradiol Triphasic (ORTHO TRI-CYCLEN LO) 0.18/0.215/0.25 MG-25 MCG tab    Sig: Take 1 tablet by mouth daily.    Dispense:  1 Package    Refill:  11   Orders Placed This Encounter   Procedures  . SureSwab, Vaginosis/Vaginitis Plus  . HIV antibody (with reflex)  . Hepatitis B surface antigen  . RPR  . Hepatitis C antibody  . TSH  . Hemoglobin A1c  . Progesterone  . CBC with Differential/Platelet  . Comprehensive metabolic panel  . Cholesterol, total  . Triglycerides  . HDL cholesterol  . Testosterone, Free, Total, SHBG  . POCT urine pregnancy

## 2015-02-09 LAB — PROGESTERONE: PROGESTERONE: 1 ng/mL

## 2015-02-09 LAB — HEPATITIS B SURFACE ANTIGEN: Hepatitis B Surface Ag: NEGATIVE

## 2015-02-09 LAB — HIV ANTIBODY (ROUTINE TESTING W REFLEX): HIV 1&2 Ab, 4th Generation: NONREACTIVE

## 2015-02-09 LAB — TSH: TSH: 0.532 u[IU]/mL (ref 0.350–4.500)

## 2015-02-09 LAB — HEPATITIS C ANTIBODY: HCV AB: NEGATIVE

## 2015-02-09 LAB — RPR

## 2015-02-11 LAB — TESTOSTERONE, FREE, TOTAL, SHBG
Sex Hormone Binding: 30 nmol/L (ref 17–124)
TESTOSTERONE FREE: 35.3 pg/mL — AB (ref 0.6–6.8)
TESTOSTERONE: 177 ng/dL — AB (ref 10–70)
Testosterone-% Free: 2 % (ref 0.4–2.4)

## 2015-02-12 ENCOUNTER — Other Ambulatory Visit: Payer: Self-pay | Admitting: Certified Nurse Midwife

## 2015-02-12 DIAGNOSIS — N760 Acute vaginitis: Principal | ICD-10-CM

## 2015-02-12 DIAGNOSIS — B9689 Other specified bacterial agents as the cause of diseases classified elsewhere: Secondary | ICD-10-CM

## 2015-02-12 LAB — SURESWAB, VAGINOSIS/VAGINITIS PLUS
Atopobium vaginae: 7.3 Log (cells/mL)
C. TROPICALIS, DNA: NOT DETECTED
C. albicans, DNA: NOT DETECTED
C. glabrata, DNA: NOT DETECTED
C. parapsilosis, DNA: NOT DETECTED
C. trachomatis RNA, TMA: NOT DETECTED
LACTOBACILLUS SPECIES: NOT DETECTED Log (cells/mL)
MEGASPHAERA SPECIES: 7.7 Log (cells/mL)
N. GONORRHOEAE RNA, TMA: NOT DETECTED
T. VAGINALIS RNA, QL TMA: NOT DETECTED

## 2015-02-12 LAB — PAP IG AND HPV HIGH-RISK: HPV DNA High Risk: NOT DETECTED

## 2015-02-12 MED ORDER — TINIDAZOLE 500 MG PO TABS: 2.0000 g | ORAL_TABLET | Freq: Every day | ORAL | Status: AC

## 2015-02-14 ENCOUNTER — Other Ambulatory Visit: Payer: Self-pay | Admitting: *Deleted

## 2015-02-14 ENCOUNTER — Telehealth: Payer: Self-pay

## 2015-02-14 DIAGNOSIS — Z7689 Persons encountering health services in other specified circumstances: Secondary | ICD-10-CM

## 2015-02-14 NOTE — Progress Notes (Signed)
Refer pt to nutrition and PCP after labs reviewed.

## 2015-02-14 NOTE — Telephone Encounter (Signed)
Spoke with patient regarding nutrition appt - they had sch her before and she no showed, but told her we put in new referral for her, also told her to change her medicaid card to Dr. Knox Royalty' office and establish herself with them she will call them

## 2015-02-19 ENCOUNTER — Telehealth: Payer: Self-pay | Admitting: *Deleted

## 2015-02-19 NOTE — Telephone Encounter (Signed)
Patient contacted the office stating she had tried to go to the pharmacy to pick up her medication but was told that it needs a prior authorization. Information forwarded to suzanne. Patient also had referral question and call was transferred to [facility].

## 2015-02-21 ENCOUNTER — Encounter: Payer: Medicaid Other | Attending: Obstetrics & Gynecology | Admitting: *Deleted

## 2015-02-21 ENCOUNTER — Encounter: Payer: Self-pay | Admitting: *Deleted

## 2015-02-21 DIAGNOSIS — E669 Obesity, unspecified: Secondary | ICD-10-CM | POA: Diagnosis not present

## 2015-02-21 DIAGNOSIS — Z713 Dietary counseling and surveillance: Secondary | ICD-10-CM | POA: Insufficient documentation

## 2015-02-21 DIAGNOSIS — Z6831 Body mass index (BMI) 31.0-31.9, adult: Secondary | ICD-10-CM | POA: Diagnosis not present

## 2015-02-21 DIAGNOSIS — R7309 Other abnormal glucose: Secondary | ICD-10-CM | POA: Insufficient documentation

## 2015-02-21 DIAGNOSIS — E282 Polycystic ovarian syndrome: Secondary | ICD-10-CM | POA: Diagnosis present

## 2015-02-21 DIAGNOSIS — R7303 Prediabetes: Secondary | ICD-10-CM

## 2015-02-21 NOTE — Patient Instructions (Addendum)
[facility] Address: [address] [place], [place] [address]  Phone: [phone]   Try Carnation Breakfast Essentials (no sugar added) for breakfast Then have lunch 3 hours later Afternoon snack Dinner in the evening  Use MyPlate recommendations for meal planning Drink water, no sugary beverages like juice, Gatorade  Aim to walk 45 minutes 3-4 days/week  Try lean ground [place], whoel grains like brown bread and brown rice and oatmeal, etc

## 2015-02-21 NOTE — Progress Notes (Signed)
Diabetes Self-Management Education  Visit Type: First/Initial  Appt. Start Time: 0945 Appt. End Time: 1100  02/21/2015  Ms. Deanna Walker, identified by name and date of birth, is a 34 y.o. female with a diagnosis of Diabetes: Pre-Diabetes, PCOS  Patient reports never receiving education on PCOS.  Has been under tremendous stress in past several months.  Has not been taking Metformin as prescribed.  Is experiencing dry itchy skin, fatigue, frequent thirst and urination, and changes in her vision.   ASSESSMENT       Diabetes Self-Management Education - 02/21/15 1009    Visit Information   Visit Type First/Initial   Initial Visit   Diabetes Type Pre-Diabetes   Are you currently following a meal plan? No  tries not to drink sodas and decrease her starch consumption   Are you taking your medications as prescribed? Yes  forgets sometimes   Health Coping   How would you rate your overall health? Good   Psychosocial Assessment   Patient Belief/Attitude about Diabetes Motivated to manage diabetes   Self-care barriers None   Self-management support None   Other persons present Patient   Patient Concerns Nutrition/Meal planning;Healthy Lifestyle;Weight Control   Special Needs None   Preferred Learning Style Auditory;Visual   Learning Readiness Ready   How often do you need to have someone help you when you read instructions, pamphlets, or other written materials from your doctor or pharmacy? 2 - Rarely   What is the last grade level you completed in school? 10th grades   Complications   Last HgB A1C per patient/outside source 6.4 %   How often do you check your blood sugar? 0 times/day (not testing)   Number of hypoglycemic episodes per month 0   Number of hyperglycemic episodes per week --  dry itchy skin daily   Have you had a dilated eye exam in the past 12 months? No   Have you had a dental exam in the past 12 months? Yes   Are you checking your feet? No   Dietary Intake   Breakfast small bowl cereal (cinnamon toast crunch)   Snack (morning) none   Lunch 2 beef tacos with sour cream and cheese   Snack (afternoon) none   Dinner chili beans   Snack (evening) pizza and skittles, starburst   Beverage(s) water and juice sometimes Gatorade   Exercise   Exercise Type Light (walking / raking leaves)   How many days per week to you exercise? 2   How many minutes per day do you exercise? 35   Total minutes per week of exercise 70   Patient Education   Previous Diabetes Education No   Disease state  Factors that contribute to the development of diabetes (PCOS)   Nutrition management  Role of diet in the treatment of diabetes and the relationship between the three main macronutrients and blood glucose level   Physical activity and exercise  Role of exercise on diabetes management, blood pressure control and cardiac health.   Medications Reviewed patients medication for diabetes/PCOS, action, purpose, timing of dose and side effects.   Psychosocial adjustment Role of stress on diabetes;Helped patient identify a support system for diabetes management   Personal strategies to promote health Lifestyle issues that need to be addressed for better diabetes care- recommended therapy   Individualized Goals (developed by patient)   Nutrition General guidelines for healthy choices and portions discussed   Physical Activity Exercise 3-5 times per week   Medications take my  medication as prescribed   Health Coping ask for help with (comment)   Outcomes   Future DMSE 2 months   Program Status Not Completed      Individualized Plan for Diabetes Self-Management Training:   Learning Objective:  Patient will have a greater understanding of diabetes self-management. Patient education plan is to attend individual and/or group sessions per assessed needs and concerns.   Plan:   Patient Instructions  [facility] Address: [address] [place], [place] [address]  Phone:  [phone]   Try Carnation Breakfast Essentials (no sugar added) for breakfast Then have lunch 3 hours later Afternoon snack Dinner in the evening  Use MyPlate recommendations for meal planning Drink water, no sugary beverages like juice, Gatorade  Aim to walk 45 minutes 3-4 days/week  Try lean ground [place], whoel grains like brown bread and brown rice and oatmeal, etc   Expected Outcomes:    demonstrated interest in learning.  Expect positive outcomes  Education material provided: My Plate  If problems or questions, patient to contact team via:  Phone  Future DSME appointment: 2 months

## 2015-02-22 ENCOUNTER — Telehealth: Payer: Self-pay | Admitting: *Deleted

## 2015-02-22 NOTE — Telephone Encounter (Signed)
Pt called office with concerns about Rx.  States that there is need for insurance approval.  Return call to pt.  LM on VM making her aware that Rx has been approved by insurance.  Pt made aware that pharmacy may need to re process her Rx to verify approval.  Made aware that call will be made to pharmacy to make them aware.  Call placed to pharmacy.  LM stating Rx has been approved and they can re run Rx to verify coverage.

## 2015-03-06 ENCOUNTER — Other Ambulatory Visit: Payer: Self-pay | Admitting: *Deleted

## 2015-03-06 DIAGNOSIS — N760 Acute vaginitis: Principal | ICD-10-CM

## 2015-03-06 DIAGNOSIS — B9689 Other specified bacterial agents as the cause of diseases classified elsewhere: Secondary | ICD-10-CM

## 2015-03-06 MED ORDER — METRONIDAZOLE 500 MG PO TABS: 500.0000 mg | ORAL_TABLET | Freq: Two times a day (BID) | ORAL | Status: DC

## 2015-03-06 NOTE — Progress Notes (Signed)
Flagyl ordered due to Tinidazole not covered by insurance.

## 2015-04-10 ENCOUNTER — Telehealth: Payer: Self-pay | Admitting: Certified Nurse Midwife

## 2015-04-10 ENCOUNTER — Ambulatory Visit: Payer: Medicaid Other | Admitting: Certified Nurse Midwife

## 2015-04-11 ENCOUNTER — Ambulatory Visit: Payer: Medicaid Other | Admitting: *Deleted

## 2015-04-11 NOTE — Telephone Encounter (Signed)
[phone] - Still unable to reach patient. brm

## 2015-05-15 ENCOUNTER — Ambulatory Visit: Payer: Medicaid Other | Admitting: Certified Nurse Midwife

## 2015-05-17 ENCOUNTER — Encounter: Payer: Self-pay | Admitting: Certified Nurse Midwife

## 2015-05-17 ENCOUNTER — Ambulatory Visit (INDEPENDENT_AMBULATORY_CARE_PROVIDER_SITE_OTHER): Payer: Medicaid Other | Admitting: Certified Nurse Midwife

## 2015-05-17 VITALS — BP 108/70 | HR 83 | Temp 98.1°F | Wt 182.0 lb

## 2015-05-17 DIAGNOSIS — N970 Female infertility associated with anovulation: Secondary | ICD-10-CM

## 2015-05-17 DIAGNOSIS — Z3169 Encounter for other general counseling and advice on procreation: Secondary | ICD-10-CM

## 2015-05-17 MED ORDER — CLOMIPHENE CITRATE 50 MG PO TABS: 50.0000 mg | ORAL_TABLET | Freq: Every day | ORAL | Status: AC

## 2015-05-17 MED ORDER — CITRANATAL HARMONY 30-1-260 MG PO CAPS: 1.0000 | ORAL_CAPSULE | Freq: Every day | ORAL | Status: DC

## 2015-05-17 NOTE — Progress Notes (Signed)
Patient ID: Deanna Walker, female   DOB: 09/20/1980, 34 y.o.   MRN: [id number]  Chief Complaint  Patient presents with  . Follow-up    HPI Deanna Walker is a 34 y.o. female.  Here for f/u on her PCOS and infertility.  Has been taking the OCPs for 3 months and is having regular periods.  Has also been taking the metformin.  Desires pregnancy.  Desires to try Clomid for a few cycles.    HPI  Past Medical History  Diagnosis Date  . Abnormal Pap smear 2011    colpo and biopsy done, normal paps since  . PCOS (polycystic ovarian syndrome)   . Boil   . Boil, axilla   . Boil of buttock   . Depression   . Diabetes mellitus   . Obesity     Past Surgical History  Procedure Laterality Date  . Abcess drainage    . Colposcopy      Family History  Problem Relation Age of Onset  . Diabetes Maternal Aunt   . Hypertension Maternal Aunt   . Seizures Mother     Social History Social History  Substance Use Topics  . Smoking status: Never Smoker   . Smokeless tobacco: Never Used  . Alcohol Use: 0.0 oz/week    0 Standard drinks or equivalent per week     Comment: occasionally     No Known Allergies  Current Outpatient Prescriptions  Medication Sig Dispense Refill  . diphenhydrAMINE (BENADRYL) 25 MG tablet Take 1 tablet (25 mg total) by mouth every 6 (six) hours as needed. 30 tablet 0  . lisinopril (PRINIVIL,ZESTRIL) 5 MG tablet     . metFORMIN (GLUCOPHAGE) 1000 MG tablet Take 1 tablet (1,000 mg total) by mouth 2 (two) times daily with a meal. 60 tablet 6  . Norgestimate-Ethinyl Estradiol Triphasic (ORTHO TRI-CYCLEN LO) 0.18/0.215/0.25 MG-25 MCG tab Take 1 tablet by mouth daily. 1 Package 11  . spironolactone (ALDACTONE) 100 MG tablet Take 1 tablet (100 mg total) by mouth daily. 30 tablet 6  . clomiPHENE (CLOMID) 50 MG tablet Take 1 tablet (50 mg total) by mouth daily. For 5 days.  Start on day 3 of bleeding. 5 tablet 1  . letrozole (FEMARA) 2.5 MG tablet Take 1 tablet (2.5  mg total) by mouth daily. 1 tablet by mouth cycle day 3-7 for 3 months (Patient not taking: Reported on 02/08/2015) 15 tablet 1  . medroxyPROGESTERone (PROVERA) 10 MG tablet Take 1 tablet (10 mg total) by mouth daily. (Patient not taking: Reported on 11/09/2014) 12 tablet 0   No current facility-administered medications for this visit.    Review of Systems Review of Systems Constitutional: negative for fatigue and weight loss Respiratory: negative for cough and wheezing Cardiovascular: negative for chest pain, fatigue and palpitations Gastrointestinal: negative for abdominal pain and change in bowel habits Genitourinary:negative Integument/breast: negative for nipple discharge Musculoskeletal:negative for myalgias Neurological: negative for gait problems and tremors Behavioral/Psych: negative for abusive relationship, depression Endocrine: negative for temperature intolerance     Blood pressure 108/70, pulse 83, temperature 98.1 F (36.7 C), weight 182 lb (82.555 kg), last menstrual period 05/03/2015.  Physical Exam Physical Exam General:   alert  Skin:   no rash or abnormalities  Lungs:   clear to auscultation bilaterally  Heart:   regular rate and rhythm, S1, S2 normal, no murmur, click, rub or gallop  Breasts:   normal without suspicious masses, skin or nipple changes or axillary nodes  Abdomen:  normal findings: no organomegaly, soft, non-tender and no hernia  Pelvis:  External genitalia: normal general appearance Urinary system: urethral meatus normal and bladder without fullness, nontender Vaginal: normal without tenderness, induration or masses Cervix: normal appearance Adnexa: normal bimanual exam Uterus: anteverted and non-tender, normal size    100% of 15 min visit spent on counseling and coordination of care.   Data Reviewed Previous medical hx, meds, labs  Assessment     PCOS Infertility d/t anovulatory cycles     Plan    No orders of the defined types were  placed in this encounter.   Meds ordered this encounter  Medications  . lisinopril (PRINIVIL,ZESTRIL) 5 MG tablet    Sig:   . clomiPHENE (CLOMID) 50 MG tablet    Sig: Take 1 tablet (50 mg total) by mouth daily. For 5 days.  Start on day 3 of bleeding.    Dispense:  5 tablet    Refill:  1     Possible management options include: infertility consult Follow up in 2 months.

## 2015-06-11 ENCOUNTER — Telehealth: Payer: Self-pay | Admitting: *Deleted

## 2015-06-11 NOTE — Telephone Encounter (Signed)
Patient has medication questions. 12/6 1:34 Patient could not afford the Clomis- so she is taking Femara. She will come to get her blood drwn on cycle day 21 to see if she is ovulating. [facility] can get cheaper pricing.

## 2015-06-18 ENCOUNTER — Ambulatory Visit (INDEPENDENT_AMBULATORY_CARE_PROVIDER_SITE_OTHER): Payer: Medicaid Other | Admitting: Certified Nurse Midwife

## 2015-06-18 ENCOUNTER — Encounter: Payer: Self-pay | Admitting: Certified Nurse Midwife

## 2015-06-18 ENCOUNTER — Other Ambulatory Visit: Payer: Medicaid Other

## 2015-06-18 VITALS — BP 133/78 | HR 87 | Wt 181.0 lb

## 2015-06-18 DIAGNOSIS — E282 Polycystic ovarian syndrome: Secondary | ICD-10-CM

## 2015-06-18 DIAGNOSIS — Z3169 Encounter for other general counseling and advice on procreation: Secondary | ICD-10-CM | POA: Diagnosis not present

## 2015-06-18 MED ORDER — CLOMIPHENE CITRATE 50 MG PO TABS: 50.0000 mg | ORAL_TABLET | Freq: Every day | ORAL | Status: DC

## 2015-06-18 NOTE — Progress Notes (Signed)
Patient ID: Deanna Walker, female   DOB: 08-Oct-1980, 34 y.o.   MRN: [id number]   Chief Complaint  Patient presents with  . Follow-up    HPI Deanna Walker is a 34 y.o. female.  Patient desires pregnancy.  Has been taking Femara because the clomid was too expensive at [facility].  Has since done some research.  Was on Femara last year without successful pregnancy.  Has been having regular periods.  Desires to start on Clomid.  Had labs drawn today.    HPI  Past Medical History  Diagnosis Date  . Abnormal Pap smear 2011    colpo and biopsy done, normal paps since  . PCOS (polycystic ovarian syndrome)   . Boil   . Boil, axilla   . Boil of buttock   . Depression   . Diabetes mellitus   . Obesity     Past Surgical History  Procedure Laterality Date  . Abcess drainage    . Colposcopy      Family History  Problem Relation Age of Onset  . Diabetes Maternal Aunt   . Hypertension Maternal Aunt   . Seizures Mother     Social History Social History  Substance Use Topics  . Smoking status: Never Smoker   . Smokeless tobacco: Never Used  . Alcohol Use: 0.0 oz/week    0 Standard drinks or equivalent per week     Comment: occasionally     No Known Allergies  Current Outpatient Prescriptions  Medication Sig Dispense Refill  . lisinopril (PRINIVIL,ZESTRIL) 5 MG tablet     . metFORMIN (GLUCOPHAGE) 1000 MG tablet Take 1 tablet (1,000 mg total) by mouth 2 (two) times daily with a meal. 60 tablet 6  . Prenat w/o A-FeCbn-DSS-FA-DHA (CITRANATAL HARMONY) 30-1-260 MG CAPS Take 1 tablet by mouth daily. 30 capsule 12  . clomiPHENE (CLOMID) 50 MG tablet Take 1 tablet (50 mg total) by mouth daily. Start on Day 3 of your period and take for five days. 5 tablet 3  . diphenhydrAMINE (BENADRYL) 25 MG tablet Take 1 tablet (25 mg total) by mouth every 6 (six) hours as needed. (Patient not taking: Reported on 06/18/2015) 30 tablet 0  . letrozole (FEMARA) 2.5 MG tablet Take 1 tablet (2.5 mg  total) by mouth daily. 1 tablet by mouth cycle day 3-7 for 3 months (Patient not taking: Reported on 02/08/2015) 15 tablet 1  . medroxyPROGESTERone (PROVERA) 10 MG tablet Take 1 tablet (10 mg total) by mouth daily. (Patient not taking: Reported on 11/09/2014) 12 tablet 0  . Norgestimate-Ethinyl Estradiol Triphasic (ORTHO TRI-CYCLEN LO) 0.18/0.215/0.25 MG-25 MCG tab Take 1 tablet by mouth daily. (Patient not taking: Reported on 06/18/2015) 1 Package 11  . spironolactone (ALDACTONE) 100 MG tablet Take 1 tablet (100 mg total) by mouth daily. (Patient not taking: Reported on 06/18/2015) 30 tablet 6   No current facility-administered medications for this visit.    Review of Systems Review of Systems Constitutional: negative for fatigue and weight loss Respiratory: negative for cough and wheezing Cardiovascular: negative for chest pain, fatigue and palpitations Gastrointestinal: negative for abdominal pain and change in bowel habits Genitourinary:negative Integument/breast: negative for nipple discharge Musculoskeletal:negative for myalgias Neurological: negative for gait problems and tremors Behavioral/Psych: negative for abusive relationship, depression Endocrine: negative for temperature intolerance     Blood pressure 133/78, pulse 87, weight 181 lb (82.101 kg), last menstrual period 05/29/2015.  Physical Exam Physical Exam General:   alert  Skin:   no rash or  abnormalities  Lungs:   clear to auscultation bilaterally  Heart:   regular rate and rhythm, S1, S2 normal, no murmur, click, rub or gallop  Breasts:   normal without suspicious masses, skin or nipple changes or axillary nodes  Abdomen:  normal findings: no organomegaly, soft, non-tender and no hernia  Pelvis:  External genitalia: normal general appearance Urinary system: urethral meatus normal and bladder without fullness, nontender Vaginal: normal without tenderness, induration or masses Cervix: normal appearance Adnexa: normal  bimanual exam Uterus: anteverted and non-tender, normal size    100% of 15 min visit spent on counseling and coordination of care.   Data Reviewed Previous medical hx, meds  Assessment     Hx of PCOS Infertility     Plan    Orders Placed This Encounter  Procedures  . Progesterone   Meds ordered this encounter  Medications  . clomiPHENE (CLOMID) 50 MG tablet    Sig: Take 1 tablet (50 mg total) by mouth daily. Start on Day 3 of your period and take for five days.    Dispense:  5 tablet    Refill:  3    Possible management options include: Infertility specialist consult Follow up in 3 months.

## 2015-06-19 LAB — PROGESTERONE: PROGESTERONE: 13 ng/mL

## 2015-07-07 NOTE — L&D Delivery Note (Signed)
35 y/o G1P0 @ 23wks and 2 days. Patient was initially admitted with incompetent no measurable cervix. Last night she started with contractions. She was started on magnesium, but did change her cervix. She was transferred to L&D. Nurse went to put foley in and found head on the perineum. Called to room for delivery. NICU was notified.   Delivery Note At 1:00 PM a viable female was delivered via Vaginal, Spontaneous Delivery (Presentation: OA; vertex).  APGAR: pending. weight 15.9 oz (450 g).   Placenta status: delivered intact with gently traction.  Cord: 3 vessel. with the following complications: none .  Cord pH: not colected.  Anesthesia:  none Episiotomy: None Lacerations:  none Est. Blood Loss (mL):  100  Mom to postpartum.  [facility] to [facility].  Ernestina Pennaicholas Schenk 05/02/2016, 1:37 PM

## 2015-07-10 ENCOUNTER — Telehealth: Payer: Self-pay | Admitting: *Deleted

## 2015-07-10 NOTE — Telephone Encounter (Signed)
After her 3rd cycle is fine.  Thank you.  R.Marnisha Stampley CNM

## 2015-07-10 NOTE — Telephone Encounter (Signed)
Note added to her lab visit.

## 2015-07-10 NOTE — Telephone Encounter (Signed)
Patient has gone through her first month of Clomid with her cycle starting 12/25. She is now doing her second month and is coming 1/14 for her d21 progesterone level. She needs to know when to come in for her 3 month pelvic check- before or after the third cycle and if she needs an US before or after to check her ovaries.

## 2015-07-18 ENCOUNTER — Ambulatory Visit: Payer: Medicaid Other | Admitting: Certified Nurse Midwife

## 2015-07-19 ENCOUNTER — Other Ambulatory Visit (INDEPENDENT_AMBULATORY_CARE_PROVIDER_SITE_OTHER): Payer: Medicaid Other

## 2015-07-19 ENCOUNTER — Encounter: Payer: Self-pay | Admitting: Certified Nurse Midwife

## 2015-07-19 ENCOUNTER — Ambulatory Visit (INDEPENDENT_AMBULATORY_CARE_PROVIDER_SITE_OTHER): Payer: Medicaid Other | Admitting: Certified Nurse Midwife

## 2015-07-19 VITALS — BP 131/75 | HR 80 | Wt 179.0 lb

## 2015-07-19 DIAGNOSIS — N970 Female infertility associated with anovulation: Secondary | ICD-10-CM

## 2015-07-19 DIAGNOSIS — E282 Polycystic ovarian syndrome: Secondary | ICD-10-CM | POA: Diagnosis not present

## 2015-07-19 NOTE — Progress Notes (Signed)
Patient ID: Deanna Walker, female   DOB: 03-22-1981, 35 y.o.   MRN: [id number]  Chief Complaint  Patient presents with  . Follow-up    pt has been on clomid, here for f/u    HPI Deanna Walker is a [age] y.o. female.  Here for Clomid f/u, just started the first month in December.  Started her period on 24th of December and stopped bleeding on the 28th.  Started the Clomid on the 27th and took as directed for five days.  Experience cramping after she finished the clomid.  Reported mild cramping, did not taking anything for the pain.  Encouraged to take Motrin for the pain if it happens again.  Seems very anxious.  Reassurance given.     HPI  Past Medical History  Diagnosis Date  . Abnormal Pap smear 2011    colpo and biopsy done, normal paps since  . PCOS (polycystic ovarian syndrome)   . Boil   . Boil, axilla   . Boil of buttock   . Depression   . Diabetes mellitus   . Obesity     Past Surgical History  Procedure Laterality Date  . Abcess drainage    . Colposcopy      Family History  Problem Relation Age of Onset  . Diabetes Maternal Aunt   . Hypertension Maternal Aunt   . Seizures Mother     Social History Social History  Substance Use Topics  . Smoking status: Never Smoker   . Smokeless tobacco: Never Used  . Alcohol Use: 0.0 oz/week    0 Standard drinks or equivalent per week     Comment: occasionally     No Known Allergies  Current Outpatient Prescriptions  Medication Sig Dispense Refill  . clomiPHENE (CLOMID) 50 MG tablet Take 1 tablet (50 mg total) by mouth daily. Start on Day 3 of your period and take for five days. 5 tablet 3  . lisinopril (PRINIVIL,ZESTRIL) 5 MG tablet     . metFORMIN (GLUCOPHAGE) 1000 MG tablet Take 1 tablet (1,000 mg total) by mouth 2 (two) times daily with a meal. 60 tablet 6  . Prenat w/o A-FeCbn-DSS-FA-DHA (CITRANATAL HARMONY) 30-1-260 MG CAPS Take 1 tablet by mouth daily. 30 capsule 12  . diphenhydrAMINE (BENADRYL) 25 MG  tablet Take 1 tablet (25 mg total) by mouth every 6 (six) hours as needed. (Patient not taking: Reported on 06/18/2015) 30 tablet 0  . letrozole (FEMARA) 2.5 MG tablet Take 1 tablet (2.5 mg total) by mouth daily. 1 tablet by mouth cycle day 3-7 for 3 months (Patient not taking: Reported on 02/08/2015) 15 tablet 1  . medroxyPROGESTERone (PROVERA) 10 MG tablet Take 1 tablet (10 mg total) by mouth daily. (Patient not taking: Reported on 11/09/2014) 12 tablet 0  . Norgestimate-Ethinyl Estradiol Triphasic (ORTHO TRI-CYCLEN LO) 0.18/0.215/0.25 MG-25 MCG tab Take 1 tablet by mouth daily. (Patient not taking: Reported on 06/18/2015) 1 Package 11  . spironolactone (ALDACTONE) 100 MG tablet Take 1 tablet (100 mg total) by mouth daily. (Patient not taking: Reported on 06/18/2015) 30 tablet 6   No current facility-administered medications for this visit.    Review of Systems Review of Systems Constitutional: negative for fatigue and weight loss Respiratory: negative for cough and wheezing Cardiovascular: negative for chest pain, fatigue and palpitations Gastrointestinal: negative for abdominal pain and change in bowel habits Genitourinary:negative Integument/breast: negative for nipple discharge Musculoskeletal:negative for myalgias Neurological: negative for gait problems and tremors Behavioral/Psych: negative for abusive relationship, depression  Endocrine: negative for temperature intolerance     Blood pressure 131/75, pulse 80, weight 179 lb (81.194 kg), last menstrual period 07/30/2014.  Physical Exam Physical Exam General:   alert  Skin:   no rash or abnormalities  Lungs:   clear to auscultation bilaterally  Heart:   regular rate and rhythm, S1, S2 normal, no murmur, click, rub or gallop  Breasts:   normal without suspicious masses, skin or nipple changes or axillary nodes  Abdomen:  normal findings: no organomegaly, soft, non-tender and no hernia  Pelvis:  External genitalia: normal general  appearance Urinary system: urethral meatus normal and bladder without fullness, nontender Vaginal: normal without tenderness, induration or masses Cervix: normal appearance Adnexa: normal bimanual exam Uterus: anteverted and non-tender, normal size    100% of 15 min visit spent on counseling and coordination of care.   Data Reviewed Previous medical hx, labs, meds  Assessment     PCOS Infertility management     Plan    No orders of the defined types were placed in this encounter.   No orders of the defined types were placed in this encounter.     Possible management options include: ultrasound  Follow up in 2 months.

## 2015-07-20 LAB — PROGESTERONE: PROGESTERONE: 21.8 ng/mL

## 2015-07-22 ENCOUNTER — Ambulatory Visit: Payer: Self-pay | Admitting: Obstetrics

## 2015-07-23 ENCOUNTER — Telehealth: Payer: Self-pay | Admitting: *Deleted

## 2015-07-23 NOTE — Telephone Encounter (Signed)
Patient contacted the office requesting her labs results. Patient advised of lab results. Per Orvilla Cornwall, CNM patient advised to continue current dose of clomid for her next cycle. Will reevaluate after her next cycle and progesterone level. May increase her dose of clomid after her next cycle. Patient verbalized understanding and agreed with plan.

## 2015-08-06 ENCOUNTER — Telehealth: Payer: Self-pay | Admitting: *Deleted

## 2015-08-06 NOTE — Telephone Encounter (Signed)
Patient did not get her Clomid this month- but she is going to continue as if she did- with her IC days 10-15 and await her cycle for next month. She will take it next month and follow up her to check her ovaries/cysts after that. She would like Ibuprofen 800 called to pharmacy for some cramping if Boykin Reaper can do that. Patient informed not to take if she should get a positive UPT at home.

## 2015-08-06 NOTE — Telephone Encounter (Signed)
That is a great plan, OK for Motrin.  Thank you.  R.Dawnell Bryant CNM

## 2015-08-09 ENCOUNTER — Other Ambulatory Visit: Payer: Self-pay | Admitting: *Deleted

## 2015-08-09 DIAGNOSIS — N946 Dysmenorrhea, unspecified: Secondary | ICD-10-CM

## 2015-08-09 MED ORDER — IBUPROFEN 800 MG PO TABS: 800.0000 mg | ORAL_TABLET | Freq: Three times a day (TID) | ORAL | Status: DC | PRN

## 2015-08-09 NOTE — Telephone Encounter (Signed)
Rx sent to the pharmacy.

## 2015-09-09 ENCOUNTER — Telehealth: Payer: Self-pay | Admitting: *Deleted

## 2015-09-09 NOTE — Telephone Encounter (Signed)
Patient would like a referral to the allergy clinic.

## 2015-09-13 ENCOUNTER — Other Ambulatory Visit: Payer: Self-pay | Admitting: Certified Nurse Midwife

## 2015-09-13 DIAGNOSIS — Z9109 Other allergy status, other than to drugs and biological substances: Secondary | ICD-10-CM

## 2015-09-13 NOTE — Telephone Encounter (Signed)
Please let her know that the referral placed for allergist.  Deanna Walker should be calling her soon.  Thank you.  R.Mehdi Gironda CNM

## 2015-09-18 ENCOUNTER — Ambulatory Visit: Payer: Medicaid Other | Admitting: Certified Nurse Midwife

## 2015-09-19 ENCOUNTER — Telehealth: Payer: Self-pay | Admitting: *Deleted

## 2015-09-19 NOTE — Telephone Encounter (Signed)
Patient requested call back- no reason left 4:23 LM on VM to CB

## 2015-09-20 ENCOUNTER — Ambulatory Visit: Payer: Medicaid Other | Admitting: Allergy and Immunology

## 2015-09-25 ENCOUNTER — Encounter: Payer: Self-pay | Admitting: Allergy and Immunology

## 2015-09-25 ENCOUNTER — Ambulatory Visit (INDEPENDENT_AMBULATORY_CARE_PROVIDER_SITE_OTHER): Payer: Medicaid Other | Admitting: Allergy and Immunology

## 2015-09-25 ENCOUNTER — Ambulatory Visit: Payer: Medicaid Other | Admitting: Allergy and Immunology

## 2015-09-25 VITALS — BP 118/64 | HR 100 | Temp 98.0°F | Resp 20 | Ht 65.16 in | Wt 186.9 lb

## 2015-09-25 DIAGNOSIS — K2970 Gastritis, unspecified, without bleeding: Secondary | ICD-10-CM | POA: Diagnosis not present

## 2015-09-25 DIAGNOSIS — L500 Allergic urticaria: Secondary | ICD-10-CM | POA: Diagnosis not present

## 2015-09-25 DIAGNOSIS — J3089 Other allergic rhinitis: Secondary | ICD-10-CM

## 2015-09-25 DIAGNOSIS — R7303 Prediabetes: Secondary | ICD-10-CM | POA: Diagnosis not present

## 2015-09-25 HISTORY — DX: Other allergic rhinitis: J30.89

## 2015-09-25 LAB — CBC WITH DIFFERENTIAL/PLATELET
Basophils Absolute: 0 10*3/uL (ref 0.0–0.1)
Basophils Relative: 0 % (ref 0–1)
Eosinophils Absolute: 0.1 10*3/uL (ref 0.0–0.7)
Eosinophils Relative: 1 % (ref 0–5)
HCT: 36.9 % (ref 36.0–46.0)
Hemoglobin: 11.8 g/dL — ABNORMAL LOW (ref 12.0–15.0)
Lymphocytes Relative: 34 % (ref 12–46)
Lymphs Abs: 3.6 10*3/uL (ref 0.7–4.0)
MCH: 25.5 pg — ABNORMAL LOW (ref 26.0–34.0)
MCHC: 32 g/dL (ref 30.0–36.0)
MCV: 79.9 fL (ref 78.0–100.0)
MPV: 10.3 fL (ref 8.6–12.4)
Monocytes Absolute: 0.8 10*3/uL (ref 0.1–1.0)
Monocytes Relative: 8 % (ref 3–12)
Neutro Abs: 6 10*3/uL (ref 1.7–7.7)
Neutrophils Relative %: 57 % (ref 43–77)
Platelets: 374 10*3/uL (ref 150–400)
RBC: 4.62 MIL/uL (ref 3.87–5.11)
RDW: 15.2 % (ref 11.5–15.5)
WBC: 10.5 10*3/uL (ref 4.0–10.5)

## 2015-09-25 LAB — COMPREHENSIVE METABOLIC PANEL
ALT: 14 U/L (ref 6–29)
AST: 16 U/L (ref 10–30)
Albumin: 4.4 g/dL (ref 3.6–5.1)
Alkaline Phosphatase: 52 U/L (ref 33–115)
BUN: 8 mg/dL (ref 7–25)
CO2: 23 mmol/L (ref 20–31)
Calcium: 9.5 mg/dL (ref 8.6–10.2)
Chloride: 103 mmol/L (ref 98–110)
Creat: 0.69 mg/dL (ref 0.50–1.10)
Glucose, Bld: 110 mg/dL — ABNORMAL HIGH (ref 65–99)
Potassium: 4 mmol/L (ref 3.5–5.3)
Sodium: 137 mmol/L (ref 135–146)
Total Bilirubin: 0.3 mg/dL (ref 0.2–1.2)
Total Protein: 7.4 g/dL (ref 6.1–8.1)

## 2015-09-25 MED ORDER — CETIRIZINE HCL 10 MG PO TABS: 10.0000 mg | ORAL_TABLET | Freq: Every day | ORAL | Status: DC

## 2015-09-25 MED ORDER — FLUTICASONE PROPIONATE 50 MCG/ACT NA SUSP
NASAL | Status: DC
Start: 2015-09-25 — End: 2016-01-02

## 2015-09-25 MED ORDER — RANITIDINE HCL 150 MG PO TABS: 150.0000 mg | ORAL_TABLET | Freq: Two times a day (BID) | ORAL | Status: DC

## 2015-09-25 NOTE — Assessment & Plan Note (Signed)
   The patient has been asked to carefully monitor blood glucose levels while on prednisone.  She has verbalized understanding and has agreed to do so. 

## 2015-09-25 NOTE — Patient Instructions (Addendum)
Chronic urticaria Uncertain etiology. Skin tests to select food allergens were negative today. NSAIDs and emotional stress commonly exacerbate urticaria but are not the underlying etiology in this case. Physical urticarias are negative by history (i.e. pressure-induced, temperature, vibration, solar, etc.).  There are no concomitant symptoms concerning for anaphylaxis or constitutional symptoms worrisome for an underlying malignancy. We will rule out other potential etiologies with labs. For symptom relief, patient is to take oral antihistamines as directed.  The following labs have been ordered: FCeRI antibody, TSH, anti-thyroglobulin antibody, thyroid peroxidase antibody, tryptase, urea breath test, CBC, CMP, ESR, and galactose-alpha-1,3-galactose IgE level.  The patient will be called with further recommendations after lab results have returned.  Instructions have been provided and discussed for H1/H2 receptor blockade with titration to find lowest effective dose.  To jumpstart symptom relief, prednisone has been provided, 20 mg x 4 days, 10 mg x1 day, then stop. The patient has been asked not to start prednisone until after the labs have been drawn.  A journal is to be kept recording any foods eaten, beverages consumed, medications taken within a 6 hour period prior to the onset of symptoms, as well as record activities being performed, and environmental conditions. For any symptoms concerning for anaphylaxis, 911 is to be called immediately.  Perennial and seasonal allergic rhinoconjunctivitis  Aeroallergen avoidance measures have been discussed and provided in written form.  A prescription has been provided for cetirizine 10 mg daily as needed.  A prescription has been provided for fluticasone nasal spray, 2 sprays per nostril daily as needed. Proper nasal spray technique has been discussed and demonstrated.  Prediabetes  The patient has been asked to carefully monitor blood glucose  levels while on prednisone.  She has verbalized understanding and has agreed to do so.    Return in about 4 weeks (around 10/23/2015), or if symptoms worsen or fail to improve.  Hives (urticaria)  . Cetirizine (Zyrtec) 20m twice a day and ranitidine (Zantac) 150 mg twice a day. If no symptoms for 7-14 days then decrease to. . Cetirizine (Zyrtec) 137mtwice a day and ranitidine (Zantac) 150 mg once a day.  If no symptoms for 7-14 days then decrease to. . Cetirizine (Zyrtec) 1060mwice a day.  If no symptoms for 7-14 days then decrease to. . Cetirizine (Zyrtec) 43m143mce a day.  May use Benadryl (diphenhydramine) as needed for breakthrough hives       If symptoms return, then step up dosage   Reducing Pollen Exposure  The American Academy of Allergy, Asthma and Immunology suggests the following steps to reduce your exposure to pollen during allergy seasons.    1. Do not hang sheets or clothing out to dry; pollen may collect on these items. 2. Do not mow lawns or spend time around freshly cut grass; mowing stirs up pollen. 3. Keep windows closed at night.  Keep car windows closed while driving. 4. Minimize morning activities outdoors, a time when pollen counts are usually at their highest. 5. Stay indoors as much as possible when pollen counts or humidity is high and on windy days when pollen tends to remain in the air longer. 6. Use air conditioning when possible.  Many air conditioners have filters that trap the pollen spores. 7. Use a HEPA room air filter to remove pollen form the indoor air you breathe.   Control of House Dust Mite Allergen  House dust mites play a major role in allergic asthma and rhinitis.  They occur in environments  with high humidity wherever human skin, the food for dust mites is found. High levels have been detected in dust obtained from mattresses, pillows, carpets, upholstered furniture, bed covers, clothes and soft toys.  The principal allergen of the house  dust mite is found in its feces.  A gram of dust may contain 1,000 mites and 250,000 fecal particles.  Mite antigen is easily measured in the air during house cleaning activities.    1. Encase mattresses, including the box spring, and pillow, in an air tight cover.  Seal the zipper end of the encased mattresses with wide adhesive tape. 2. Wash the bedding in water of 130 degrees Farenheit weekly.  Avoid cotton comforters/quilts and flannel bedding: the most ideal bed covering is the dacron comforter. 3. Remove all upholstered furniture from the bedroom. 4. Remove carpets, carpet padding, rugs, and non-washable window drapes from the bedroom.  Wash drapes weekly or use plastic window coverings. 5. Remove all non-washable stuffed toys from the bedroom.  Wash stuffed toys weekly. 6. Have the room cleaned frequently with a vacuum cleaner and a damp dust-mop.  The patient should not be in a room which is being cleaned and should wait 1 hour after cleaning before going into the room. 7. Close and seal all heating outlets in the bedroom.  Otherwise, the room will become filled with dust-laden air.  An electric heater can be used to heat the room. Reduce indoor humidity to less than 50%.  Do not use a humidifier.  Control of Dog or Cat Allergen  Avoidance is the best way to manage a dog or cat allergy. If you have a dog or cat and are allergic to dog or cats, consider removing the dog or cat from the home. If you have a dog or cat but don't want to find it a new home, or if your family wants a pet even though someone in the household is allergic, here are some strategies that may help keep symptoms at bay:  1. Keep the pet out of your bedroom and restrict it to only a few rooms. Be advised that keeping the dog or cat in only one room will not limit the allergens to that room. 2. Don't pet, hug or kiss the dog or cat; if you do, wash your hands with soap and water. 3. High-efficiency particulate air (HEPA)  cleaners run continuously in a bedroom or living room can reduce allergen levels over time. 4. Regular use of a high-efficiency vacuum cleaner or a central vacuum can reduce allergen levels. 5. Giving your dog or cat a bath at least once a week can reduce airborne allergen.  Control of Mold Allergen  Mold and fungi can grow on a variety of surfaces provided certain temperature and moisture conditions exist.  Outdoor molds grow on plants, decaying vegetation and soil.  The major outdoor mold, Alternaria and Cladosporium, are found in very high numbers during hot and dry conditions.  Generally, a late Summer - Fall peak is seen for common outdoor fungal spores.  Rain will temporarily lower outdoor mold spore count, but counts rise rapidly when the rainy period ends.  The most important indoor molds are Aspergillus and Penicillium.  Dark, humid and poorly ventilated basements are ideal sites for mold growth.  The next most common sites of mold growth are the bathroom and the kitchen.  Outdoor [employer] 1. Use air conditioning and keep windows closed 2. Avoid exposure to decaying vegetation. 3. Avoid leaf raking. 4.  Avoid grain handling. 5. Consider wearing a face mask if working in moldy areas.  Indoor Mold Control 1. Maintain humidity below 50%. 2. Clean washable surfaces with 5% bleach solution. 3. Remove sources e.g. Contaminated carpets.  Control of Cockroach Allergen  Cockroach allergen has been identified as an important cause of acute attacks of asthma, especially in urban settings.  There are fifty-five species of cockroach that exist in the [place], however only three, the [place], [place] species produce allergen that can affect patients with Asthma.  Allergens can be obtained from fecal particles, egg casings and secretions from cockroaches.    1. Remove food sources. 2. Reduce access to water. 3. Seal access and entry points. 4. Spray runways with 0.5-1%  Diazinon or Chlorpyrifos 5. Blow boric acid power under stoves and refrigerator. 6. Place bait stations (hydramethylnon) at feeding sites.

## 2015-09-25 NOTE — Progress Notes (Signed)
New Patient Note  RE: Deanna Walker MRN: [id number] DOB: 1981/03/30 Date of Office Visit: 09/25/2015  Referring provider: Morene Walker, CNM Primary care provider: Andria Frames, MD  Chief Complaint: Pruritis and Urticaria   History of present illness: HPI Comments: Deanna Walker is a 35 y.o. female presenting today for consultation of hives. Over the past 1 year, Deanna Walker has experienced recurrent episodes of hives. Typical distribution includes the entire body.  The lesions are described as erythematous, raised, and pruritic.  Individual hives last less than 24 hours without leaving residual pigmentation or bruising. She does not experience concomitant cardiopulmonary or GI symptoms. She has not experienced unexpected weight loss, recurrent fevers or drenching night sweats. No specific medication, food or environmental triggers have been identified. The symptoms do not seem to correlate with NSAIDs use or stress emotional stress. She did not have signs or symptoms of infection at the time of symptom onset. Deanna Walker has tried to control symptoms with OTC antihistamines which have offered minimal temporary relief of symptoms. She has been evaluated at urgent care for these symptoms. Skin biopsy has not been performed.  She experiences ocular pruritus and possible rhinitis, particularly with pollen exposure.   Assessment and plan: Chronic urticaria Uncertain etiology. Skin tests to select food allergens were negative today. NSAIDs and emotional stress commonly exacerbate urticaria but are not the underlying etiology in this case. Physical urticarias are negative by history (i.e. pressure-induced, temperature, vibration, solar, etc.).  There are no concomitant symptoms concerning for anaphylaxis or constitutional symptoms worrisome for an underlying malignancy. We will rule out other potential etiologies with labs. For symptom relief, patient is to take oral antihistamines as  directed.  The following labs have been ordered: FCeRI antibody, TSH, anti-thyroglobulin antibody, thyroid peroxidase antibody, tryptase, urea breath test, CBC, CMP, ESR, and galactose-alpha-1,3-galactose IgE level.  The patient will be called with further recommendations after lab results have returned.  Instructions have been provided and discussed for H1/H2 receptor blockade with titration to find lowest effective dose.  To jumpstart symptom relief, prednisone has been provided, 20 mg x 4 days, 10 mg x1 day, then stop. The patient has been asked not to start prednisone until after the labs have been drawn.  A journal is to be kept recording any foods eaten, beverages consumed, medications taken within a 6 hour period prior to the onset of symptoms, as well as record activities being performed, and environmental conditions. For any symptoms concerning for anaphylaxis, 911 is to be called immediately.  Perennial and seasonal allergic rhinoconjunctivitis  Aeroallergen avoidance measures have been discussed and provided in written form.  A prescription has been provided for cetirizine 10 mg daily as needed.  A prescription has been provided for fluticasone nasal spray, 2 sprays per nostril daily as needed. Proper nasal spray technique has been discussed and demonstrated.  Prediabetes  The patient has been asked to carefully monitor blood glucose levels while on prednisone.  She has verbalized understanding and has agreed to do so.   Diagnositics: Environmental skin testing: Positive to grass pollen and ragweed pollen. Food allergen skin testing: Negative despite a positive histamine control.    Physical examination: Blood pressure 118/64, pulse 100, temperature 98 F (36.7 C), temperature source Oral, resp. rate 20, height 5' 5.16" (1.655 m), weight 186 lb 15.2 oz (84.8 kg).  General: Alert, interactive, in no acute distress. HEENT: TMs pearly gray, turbinates edematous without  discharge, post-pharynx mildly erythematous. Neck: Supple without lymphadenopathy. Lungs: Clear  to auscultation without wheezing, rhonchi or rales. CV: Normal S1, S2 without murmurs. Abdomen: Nondistended, nontender. Skin: Scattered erythematous urticarial type lesions primarily located on the abdomen , nonvesicular. Extremities:  No clubbing, cyanosis or edema. Neuro:   Grossly intact.  Review of systems:  Review of Systems  Constitutional: Negative for fever, chills and weight loss.  HENT: Negative for nosebleeds.   Eyes: Negative for blurred vision.  Respiratory: Negative for cough, hemoptysis, shortness of breath and wheezing.   Cardiovascular: Negative for chest pain.  Gastrointestinal: Negative for diarrhea and constipation.  Genitourinary: Negative for dysuria.  Musculoskeletal: Negative for myalgias and joint pain.  Skin: Positive for itching and rash.  Neurological: Negative for dizziness.  Endo/Heme/Allergies: Positive for environmental allergies. Does not bruise/bleed easily.    Past medical history:  Past Medical History  Diagnosis Date  . Abnormal Pap smear 2011    colpo and biopsy done, normal paps since  . PCOS (polycystic ovarian syndrome)   . Boil   . Boil, axilla   . Boil of buttock   . Depression   . Diabetes mellitus   . Obesity     Past surgical history:  Past Surgical History  Procedure Laterality Date  . Abcess drainage    . Colposcopy      Family history: Family History  Problem Relation Age of Onset  . Diabetes Maternal Aunt   . Hypertension Maternal Aunt   . Seizures Mother   . Allergic rhinitis Neg Hx   . Angioedema Neg Hx   . Asthma Neg Hx   . Atopy Neg Hx   . Eczema Neg Hx   . Immunodeficiency Neg Hx   . Urticaria Neg Hx   . Cystic fibrosis Neg Hx   . Lupus Neg Hx   . Emphysema Neg Hx   . Migraines Neg Hx     Social history: Social History   Social History  . Marital Status: Single    Spouse Name: N/A  . Number of  Children: N/A  . Years of Education: N/A   Occupational History  . Not on file.   Social History Main Topics  . Smoking status: Never Smoker   . Smokeless tobacco: Never Used  . Alcohol Use: 0.0 oz/week    0 Standard drinks or equivalent per week     Comment: occasionally   . Drug Use: No     Comment: past marijuana   . Sexual Activity:    Partners: Male    Patent examiner Protection: None   Other Topics Concern  . Not on file   Social History Narrative   Environmental History: The patient lives in a 35 year old apartment with carpeting throughout, gas heat, and central air.  She is a nonsmoker without pets.    Medication List       This list is accurate as of: 09/25/15 12:32 PM.  Always use your most recent med list.               cetirizine 10 MG tablet  Commonly known as:  ZYRTEC  Take 1 tablet (10 mg total) by mouth daily.     CITRANATAL HARMONY 30-1-260 MG Caps  Take 1 tablet by mouth daily.     clomiPHENE 50 MG tablet  Commonly known as:  CLOMID  Take 1 tablet (50 mg total) by mouth daily. Start on Day 3 of your period and take for five days.     diphenhydrAMINE 25 MG tablet  Commonly known as:  BENADRYL  Take 1 tablet (25 mg total) by mouth every 6 (six) hours as needed.     fluticasone 50 MCG/ACT nasal spray  Commonly known as:  FLONASE  Use 2 sprays per nostril daily as needed     hydrOXYzine 25 MG capsule  Commonly known as:  VISTARIL  take 1 capsule by mouth three times a day     hydrOXYzine 25 MG tablet  Commonly known as:  ATARAX/VISTARIL  Take 25 mg by mouth 3 (three) times daily as needed.     ibuprofen 800 MG tablet  Commonly known as:  ADVIL,MOTRIN  Take 1 tablet (800 mg total) by mouth every 8 (eight) hours as needed.     lisinopril 5 MG tablet  Commonly known as:  PRINIVIL,ZESTRIL     metFORMIN 1000 MG tablet  Commonly known as:  GLUCOPHAGE  Take 1 tablet (1,000 mg total) by mouth 2 (two) times daily with a meal.     ranitidine  150 MG tablet  Commonly known as:  ZANTAC  Take 1 tablet (150 mg total) by mouth 2 (two) times daily.     spironolactone 100 MG tablet  Commonly known as:  ALDACTONE  Take 1 tablet (100 mg total) by mouth daily.        Known medication allergies: No Known Allergies  I appreciate the opportunity to take part in this Kirsten's care. Please do not hesitate to contact me with questions.  Sincerely,   R. Edgar Frisk, MD

## 2015-09-25 NOTE — Assessment & Plan Note (Addendum)
Uncertain etiology. Skin tests to select food allergens were negative today. NSAIDs and emotional stress commonly exacerbate urticaria but are not the underlying etiology in this case. Physical urticarias are negative by history (i.e. pressure-induced, temperature, vibration, solar, etc.).  There are no concomitant symptoms concerning for anaphylaxis or constitutional symptoms worrisome for an underlying malignancy. We will rule out other potential etiologies with labs. For symptom relief, patient is to take oral antihistamines as directed.  The following labs have been ordered: FCeRI antibody, TSH, anti-thyroglobulin antibody, thyroid peroxidase antibody, tryptase, urea breath test, CBC, CMP, ESR, and galactose-alpha-1,3-galactose IgE level.  The patient will be called with further recommendations after lab results have returned.  Instructions have been provided and discussed for H1/H2 receptor blockade with titration to find lowest effective dose.  To jumpstart symptom relief, prednisone has been provided, 20 mg x 4 days, 10 mg x1 day, then stop. The patient has been asked not to start prednisone until after the labs have been drawn.  A journal is to be kept recording any foods eaten, beverages consumed, medications taken within a 6 hour period prior to the onset of symptoms, as well as record activities being performed, and environmental conditions. For any symptoms concerning for anaphylaxis, 911 is to be called immediately.

## 2015-09-25 NOTE — Assessment & Plan Note (Signed)
   Aeroallergen avoidance measures have been discussed and provided in written form.  A prescription has been provided for cetirizine 10 mg daily as needed.  A prescription has been provided for fluticasone nasal spray, 2 sprays per nostril daily as needed. Proper nasal spray technique has been discussed and demonstrated.

## 2015-09-26 LAB — SEDIMENTATION RATE: Sed Rate: 8 mm/hr (ref 0–20)

## 2015-09-26 LAB — H. PYLORI BREATH TEST: H. pylori Breath Test: NOT DETECTED

## 2015-09-27 LAB — TRYPTASE: Tryptase: 11.3 ug/L — ABNORMAL HIGH (ref <11)

## 2015-09-28 LAB — ALPHA-GAL PANEL
Allergen, Mutton, f88: <0.1 kU/L
Allergen, Pork, f26: <0.1 kU/L
Beef: 0.31 kU/L — ABNORMAL HIGH
Galactose-alpha-1,3-galactose IgE: <0.1 kU/L (ref <0.35)

## 2015-10-02 ENCOUNTER — Telehealth: Payer: Self-pay | Admitting: *Deleted

## 2015-10-02 LAB — CP CHRONIC URTICARIA INDEX PANEL
Histamine Release: <16 % (ref <16)
TSH: 1.47 mIU/L
Thyroglobulin Ab: <1 IU/mL (ref <2)
Thyroperoxidase Ab SerPl-aCnc: <1 IU/mL (ref <9)

## 2015-10-02 NOTE — Telephone Encounter (Signed)
Patient has not returned call- refile

## 2015-10-02 NOTE — Telephone Encounter (Signed)
Patient is using Clomid 50 mg and is on the 3rd month. She wants to know when she needs to schedule to come in for her US to check her ovaries and after that are we going to increase her dosing? (She is taking her Clomid right now.)

## 2015-10-03 ENCOUNTER — Encounter: Payer: Self-pay | Admitting: *Deleted

## 2015-10-03 NOTE — Telephone Encounter (Signed)
Yes, lets schedule a f/u appointment with an ultrasound.  Thank you.  [name]

## 2015-10-04 ENCOUNTER — Telehealth: Payer: Self-pay

## 2015-10-04 NOTE — Telephone Encounter (Signed)
Patient was calling to get an update on her labs done on 09/25/15-Deanna Walker.

## 2015-10-07 NOTE — Telephone Encounter (Signed)
Patient given message and instructions per Dr. Sheran FavaBobbitt's message

## 2015-10-07 NOTE — Telephone Encounter (Signed)
Labs revealed a borderline/equivocal results to beef IgE.  Unless the patient has a strong correlation between the consumption of beef and her symptoms, this most likely represents a false positive result.  No other labs illuminate an etiology.  Continue H1/H2 receptor blockade, titrating to the lowest effective dose necessary to suppress urticaria. Should significant or new symptoms occur, a journal is to be kept recording any foods eaten, beverages consumed, medications taken, activities performed, and environmental conditions within a 6 hour time period prior to the onset of symptoms. For any symptoms concerning for anaphylaxis, 911 is to be called immediately. Thanks.

## 2015-10-07 NOTE — Telephone Encounter (Signed)
When do you want it scheduled?

## 2015-10-09 ENCOUNTER — Telehealth: Payer: Self-pay

## 2015-10-09 ENCOUNTER — Other Ambulatory Visit: Payer: Self-pay | Admitting: Certified Nurse Midwife

## 2015-10-09 DIAGNOSIS — N970 Female infertility associated with anovulation: Secondary | ICD-10-CM

## 2015-10-09 NOTE — Telephone Encounter (Signed)
Lets schedule for around time of ovulation around day 14 in her cycle.  [facility]   Thank you.   R.Laurella Tull CNM

## 2015-10-09 NOTE — Telephone Encounter (Signed)
Can you change one of the transvaginal non-ob orders to us pelvic complete? Thanks!

## 2015-10-09 NOTE — Telephone Encounter (Signed)
Called patient- her phone was dying so I had to leave her a message about when to schedule. [facility]

## 2015-10-14 ENCOUNTER — Ambulatory Visit (INDEPENDENT_AMBULATORY_CARE_PROVIDER_SITE_OTHER): Payer: Medicaid Other

## 2015-10-14 DIAGNOSIS — N970 Female infertility associated with anovulation: Secondary | ICD-10-CM | POA: Diagnosis not present

## 2015-10-15 ENCOUNTER — Other Ambulatory Visit: Payer: Self-pay | Admitting: Certified Nurse Midwife

## 2015-10-15 DIAGNOSIS — F32A Depression, unspecified: Secondary | ICD-10-CM

## 2015-10-15 DIAGNOSIS — F329 Major depressive disorder, single episode, unspecified: Secondary | ICD-10-CM

## 2015-10-15 DIAGNOSIS — N970 Female infertility associated with anovulation: Secondary | ICD-10-CM

## 2015-10-15 NOTE — Progress Notes (Unsigned)
Left voicemail message to call the clinic back.  R.Blane Worthington CNM

## 2015-10-16 ENCOUNTER — Telehealth: Payer: Self-pay | Admitting: *Deleted

## 2015-10-16 NOTE — Telephone Encounter (Signed)
Pt made aware of lab/u/s results. Pt was made aware results show that she is not ovulating.  Pt made aware of referral to specialist for PCOS.  Pt made aware she should get call once referral is processed.

## 2015-10-16 NOTE — Telephone Encounter (Signed)
Pt has question regarding referral to Dr Elesa Hackereaton.  ?insurance coverage and office locations. Could you please contact pt once referral processed?

## 2015-10-17 ENCOUNTER — Telehealth: Payer: Self-pay

## 2015-10-17 NOTE — Telephone Encounter (Signed)
Left message for patient regarding appt for PCOS/Infertility with Dr. Elesa Hackereaton - Medicaid will cover PCOS, but will not cover infertility - she would have to pay out of pocket - 290.00 appt is for PCOS, - called patient twice to let her know

## 2015-10-24 ENCOUNTER — Telehealth: Payer: Self-pay

## 2015-10-24 NOTE — Telephone Encounter (Signed)
Patient seems confused about appt with Dr. Elesa Hackereaton - advised her it was Monday, 4/24 at 1pm - and to call their office [phone] for confirmation. I called their office and they said  sent new patient packet info, and patient had spoken with someone named Selena BattenKim in their office three times, according to them. Patient said she's talked to no one. I told her to be sure and call them after we got off phone. Also asked if she had moved and she said, no.

## 2015-10-29 ENCOUNTER — Other Ambulatory Visit: Payer: Self-pay

## 2015-10-29 DIAGNOSIS — J3089 Other allergic rhinitis: Secondary | ICD-10-CM

## 2015-10-29 DIAGNOSIS — L500 Allergic urticaria: Secondary | ICD-10-CM

## 2015-10-29 MED ORDER — CETIRIZINE HCL 10 MG PO TABS: 10.0000 mg | ORAL_TABLET | Freq: Every day | ORAL | Status: DC

## 2015-11-18 ENCOUNTER — Other Ambulatory Visit: Payer: Self-pay | Admitting: Certified Nurse Midwife

## 2015-12-26 ENCOUNTER — Telehealth: Payer: Self-pay | Admitting: *Deleted

## 2015-12-26 ENCOUNTER — Other Ambulatory Visit: Payer: Medicaid Other

## 2015-12-26 NOTE — Telephone Encounter (Signed)
Patient is calling because she thinks she may have had a positive home pregnancy test. Patient LMP was 11/27/2015. Appointment given for confirmation.

## 2015-12-27 ENCOUNTER — Other Ambulatory Visit (INDEPENDENT_AMBULATORY_CARE_PROVIDER_SITE_OTHER): Payer: Medicaid Other

## 2015-12-27 DIAGNOSIS — Z3201 Encounter for pregnancy test, result positive: Secondary | ICD-10-CM

## 2015-12-27 DIAGNOSIS — Z3200 Encounter for pregnancy test, result unknown: Secondary | ICD-10-CM

## 2015-12-27 LAB — POCT URINE PREGNANCY: PREG TEST UR: POSITIVE — AB

## 2015-12-30 ENCOUNTER — Other Ambulatory Visit: Payer: Medicaid Other

## 2016-01-02 ENCOUNTER — Inpatient Hospital Stay (HOSPITAL_COMMUNITY): Payer: Medicaid Other

## 2016-01-02 ENCOUNTER — Other Ambulatory Visit: Payer: Self-pay | Admitting: Certified Nurse Midwife

## 2016-01-02 ENCOUNTER — Inpatient Hospital Stay (HOSPITAL_COMMUNITY)
Admission: AD | Admit: 2016-01-02 | Discharge: 2016-01-02 | Disposition: A | Discharge status: Home / Self Care | Payer: Medicaid Other | Source: Ambulatory Visit | Attending: Obstetrics | Admitting: Obstetrics

## 2016-01-02 ENCOUNTER — Encounter (HOSPITAL_COMMUNITY): Payer: Self-pay | Admitting: Medical

## 2016-01-02 DIAGNOSIS — O24311 Unspecified pre-existing diabetes mellitus in pregnancy, first trimester: Secondary | ICD-10-CM | POA: Insufficient documentation

## 2016-01-02 DIAGNOSIS — O26891 Other specified pregnancy related conditions, first trimester: Secondary | ICD-10-CM | POA: Insufficient documentation

## 2016-01-02 DIAGNOSIS — E282 Polycystic ovarian syndrome: Secondary | ICD-10-CM | POA: Diagnosis not present

## 2016-01-02 DIAGNOSIS — E119 Type 2 diabetes mellitus without complications: Secondary | ICD-10-CM | POA: Insufficient documentation

## 2016-01-02 DIAGNOSIS — A499 Bacterial infection, unspecified: Secondary | ICD-10-CM | POA: Diagnosis not present

## 2016-01-02 DIAGNOSIS — Z3A01 Less than 8 weeks gestation of pregnancy: Secondary | ICD-10-CM | POA: Diagnosis not present

## 2016-01-02 DIAGNOSIS — B9689 Other specified bacterial agents as the cause of diseases classified elsewhere: Secondary | ICD-10-CM | POA: Diagnosis not present

## 2016-01-02 DIAGNOSIS — O26899 Other specified pregnancy related conditions, unspecified trimester: Secondary | ICD-10-CM

## 2016-01-02 DIAGNOSIS — R109 Unspecified abdominal pain: Secondary | ICD-10-CM | POA: Insufficient documentation

## 2016-01-02 DIAGNOSIS — F329 Major depressive disorder, single episode, unspecified: Secondary | ICD-10-CM | POA: Insufficient documentation

## 2016-01-02 DIAGNOSIS — O23591 Infection of other part of genital tract in pregnancy, first trimester: Secondary | ICD-10-CM | POA: Insufficient documentation

## 2016-01-02 DIAGNOSIS — N760 Acute vaginitis: Secondary | ICD-10-CM

## 2016-01-02 LAB — URINALYSIS, ROUTINE W REFLEX MICROSCOPIC
Bilirubin Urine: NEGATIVE
GLUCOSE, UA: NEGATIVE mg/dL
Hgb urine dipstick: NEGATIVE
KETONES UR: NEGATIVE mg/dL
LEUKOCYTES UA: NEGATIVE
NITRITE: NEGATIVE
PROTEIN: NEGATIVE mg/dL
Specific Gravity, Urine: 1.015 (ref 1.005–1.030)
pH: 6 (ref 5.0–8.0)

## 2016-01-02 LAB — CBC WITH DIFFERENTIAL/PLATELET
BASOS ABS: 0 10*3/uL (ref 0.0–0.1)
BASOS PCT: 0 %
EOS ABS: 0.1 10*3/uL (ref 0.0–0.7)
EOS PCT: 1 %
HCT: 35.6 % — ABNORMAL LOW (ref 36.0–46.0)
HEMOGLOBIN: 12.2 g/dL (ref 12.0–15.0)
LYMPHS ABS: 4.2 10*3/uL — AB (ref 0.7–4.0)
Lymphocytes Relative: 32 %
MCH: 26.3 pg (ref 26.0–34.0)
MCHC: 34.3 g/dL (ref 30.0–36.0)
MCV: 76.7 fL — ABNORMAL LOW (ref 78.0–100.0)
Monocytes Absolute: 1 10*3/uL (ref 0.1–1.0)
Monocytes Relative: 8 %
NEUTROS PCT: 60 %
Neutro Abs: 7.9 10*3/uL — ABNORMAL HIGH (ref 1.7–7.7)
PLATELETS: 412 10*3/uL — AB (ref 150–400)
RBC: 4.64 MIL/uL (ref 3.87–5.11)
RDW: 14.9 % (ref 11.5–15.5)
WBC: 13.2 10*3/uL — AB (ref 4.0–10.5)

## 2016-01-02 LAB — WET PREP, GENITAL
Sperm: NONE SEEN
TRICH WET PREP: NONE SEEN
YEAST WET PREP: NONE SEEN

## 2016-01-02 LAB — HCG, QUANTITATIVE, PREGNANCY: HCG, BETA CHAIN, QUANT, S: 6599 m[IU]/mL — AB (ref <5)

## 2016-01-02 LAB — ABO/RH: ABO/RH(D): B POS

## 2016-01-02 MED ORDER — METRONIDAZOLE 500 MG PO TABS: 500.0000 mg | ORAL_TABLET | Freq: Two times a day (BID) | ORAL | Status: DC

## 2016-01-02 NOTE — MAU Note (Signed)
Pt c/o abd pain on and off for about 2 days. Had positive HPT. Denies vag bleeding or discharge.

## 2016-01-02 NOTE — MAU Provider Note (Signed)
History     CSN: [id number]  Arrival date and time: 01/02/16 1401   First Provider Initiated Contact with Patient 01/02/16 1436      Chief Complaint  Patient presents with  . Abdominal Pain   HPI Deanna Walker is a 35 y.o. G1P0 at 5632w0d who presents to MAU today with complaint of abdominal pain. LMP 11/28/15 [facility]. The patient states lower abdominal cramping off and on since last week. She rates pain at 5/10 now. She has not taken anything for pain. She denies bleeding, discharge, UTI symptoms, fever or N/V/D.   OB History    Gravida Para Term Preterm AB TAB SAB Ectopic Multiple Living   1 0              Past Medical History  Diagnosis Date  . Abnormal Pap smear 2011    colpo and biopsy done, normal paps since  . PCOS (polycystic ovarian syndrome)   . Boil   . Boil, axilla   . Boil of buttock   . Depression   . Diabetes mellitus   . Obesity     Past Surgical History  Procedure Laterality Date  . Abcess drainage    . Colposcopy      Family History  Problem Relation Age of Onset  . Diabetes Maternal Aunt   . Hypertension Maternal Aunt   . Seizures Mother   . Allergic rhinitis Neg Hx   . Angioedema Neg Hx   . Asthma Neg Hx   . Atopy Neg Hx   . Eczema Neg Hx   . Immunodeficiency Neg Hx   . Urticaria Neg Hx   . Cystic fibrosis Neg Hx   . Lupus Neg Hx   . Emphysema Neg Hx   . Migraines Neg Hx     Social History  Substance Use Topics  . Smoking status: Never Smoker   . Smokeless tobacco: Never Used  . Alcohol Use: 0.0 oz/week    0 Standard drinks or equivalent per week     Comment: occasionally     Allergies: No Known Allergies  No prescriptions prior to admission    Review of Systems  Constitutional: Negative for fever and malaise/fatigue.  Gastrointestinal: Positive for abdominal pain. Negative for nausea, vomiting, diarrhea and constipation.  Genitourinary: Negative for dysuria, urgency and frequency.       Neg -  vaginal bleeding, discharge   Physical Exam   Blood pressure 132/70, pulse 78, temperature 98.4 F (36.9 C), temperature source Oral, resp. rate 18, height 5\' 5"  (1.651 m), weight 186 lb (84.369 kg), last menstrual period 11/28/2015, SpO2 100 %.  Physical Exam  Nursing note and vitals reviewed. Constitutional: She is oriented to person, place, and time. She appears well-developed and well-nourished. No distress.  HENT:  Head: Normocephalic and atraumatic.  Cardiovascular: Normal rate.   Respiratory: Effort normal.  GI: Soft. She exhibits no distension and no mass. There is tenderness (mild lower abdominal tenderness to palpation bilaterally). There is no rebound and no guarding.  Neurological: She is alert and oriented to person, place, and time.  Skin: Skin is warm and dry. No erythema.  Psychiatric: She has a normal mood and affect.    Results for orders placed or performed during the hospital encounter of 01/02/16 (from the past 24 hour(s))  ABO/Rh     Status: None   Collection Time: 01/02/16  2:56 PM  Result Value Ref Range   ABO/RH(D) B POS  hCG, quantitative, pregnancy     Status: Abnormal   Collection Time: 01/02/16  2:56 PM  Result Value Ref Range   hCG, Beta Chain, Quant, S 6599 (H) <5 mIU/mL  CBC with Differential/Platelet     Status: Abnormal   Collection Time: 01/02/16  2:57 PM  Result Value Ref Range   WBC 13.2 (H) 4.0 - 10.5 K/uL   RBC 4.64 3.87 - 5.11 MIL/uL   Hemoglobin 12.2 12.0 - 15.0 g/dL   HCT 16.1 (L) 09.6 - 04.5 %   MCV 76.7 (L) 78.0 - 100.0 fL   MCH 26.3 26.0 - 34.0 pg   MCHC 34.3 30.0 - 36.0 g/dL   RDW 40.9 81.1 - 91.4 %   Platelets 412 (H) 150 - 400 K/uL   Neutrophils Relative % 60 %   Neutro Abs 7.9 (H) 1.7 - 7.7 K/uL   Lymphocytes Relative 32 %   Lymphs Abs 4.2 (H) 0.7 - 4.0 K/uL   Monocytes Relative 8 %   Monocytes Absolute 1.0 0.1 - 1.0 K/uL   Eosinophils Relative 1 %   Eosinophils Absolute 0.1 0.0 - 0.7 K/uL   Basophils Relative 0 %    Basophils Absolute 0.0 0.0 - 0.1 K/uL  Wet prep, genital     Status: Abnormal   Collection Time: 01/02/16  4:39 PM  Result Value Ref Range   Yeast Wet Prep HPF POC NONE SEEN NONE SEEN   Trich, Wet Prep NONE SEEN NONE SEEN   Clue Cells Wet Prep HPF POC PRESENT (A) NONE SEEN   WBC, Wet Prep HPF POC FEW (A) NONE SEEN   Sperm NONE SEEN   Urinalysis, Routine w reflex microscopic (not at [facility])     Status: None   Collection Time: 01/02/16  5:09 PM  Result Value Ref Range   Color, Urine YELLOW YELLOW   APPearance CLEAR CLEAR   Specific Gravity, Urine 1.015 1.005 - 1.030   pH 6.0 5.0 - 8.0   Glucose, UA NEGATIVE NEGATIVE mg/dL   Hgb urine dipstick NEGATIVE NEGATIVE   Bilirubin Urine NEGATIVE NEGATIVE   Ketones, ur NEGATIVE NEGATIVE mg/dL   Protein, ur NEGATIVE NEGATIVE mg/dL   Nitrite NEGATIVE NEGATIVE   Leukocytes, UA NEGATIVE NEGATIVE   US Ob Comp Less 14 Wks  01/02/2016  CLINICAL DATA:  Abdominal pain. EXAM: OBSTETRIC <14 WK [place] AND TRANSVAGINAL OB US DOPPLER ULTRASOUND OF OVARIES TECHNIQUE: Both transabdominal and transvaginal ultrasound examinations were performed for complete evaluation of the gestation as well as the maternal uterus, adnexal regions, and pelvic cul-de-sac. Transvaginal technique was performed to assess early pregnancy. Color and duplex Doppler ultrasound was utilized to evaluate blood flow to the ovaries. COMPARISON:  None. FINDINGS: Intrauterine gestational sac: Single Yolk sac:  Present Embryo:  Not present Cardiac Activity: Not present MSD: 8  mm   5 w   4  d Subchorionic hemorrhage:  None visualized. Maternal uterus/adnexae: Normal bilateral ovaries. Right ovary measures 3 x 4.6 x 2.6 cm. Left ovary measures 5.3 x 4.8 x 3.7 Cm. Moderate amount of pelvic free fluid. Pulsed Doppler evaluation of both ovaries demonstrates normal appearing low-resistance arterial and venous waveforms. IMPRESSION: 1. Probable early intrauterine gestational sac, but no fetal pole or cardiac  activity yet visualized. Recommend follow-up quantitative B-HCG levels and follow-up US in 14 days to confirm and assess viability. This recommendation follows SRU consensus guidelines: Diagnostic Criteria for Nonviable Pregnancy Early in the First Trimester. Malva Limes Med 2013; 782:9562-13. 2. Normal bilateral ovaries.  No ovarian torsion. 3. Moderate pelvic free fluid. Electronically Signed   By: Elige KoHetal  Patel   On: 01/02/2016 16:25   [place] Ob Transvaginal  01/02/2016  CLINICAL DATA:  Abdominal pain. EXAM: OBSTETRIC <14 WK US AND TRANSVAGINAL OB US DOPPLER ULTRASOUND OF OVARIES TECHNIQUE: Both transabdominal and transvaginal ultrasound examinations were performed for complete evaluation of the gestation as well as the maternal uterus, adnexal regions, and pelvic cul-de-sac. Transvaginal technique was performed to assess early pregnancy. Color and duplex Doppler ultrasound was utilized to evaluate blood flow to the ovaries. COMPARISON:  None. FINDINGS: Intrauterine gestational sac: Single Yolk sac:  Present Embryo:  Not present Cardiac Activity: Not present MSD: 8  mm   5 w   4  d Subchorionic hemorrhage:  None visualized. Maternal uterus/adnexae: Normal bilateral ovaries. Right ovary measures 3 x 4.6 x 2.6 cm. Left ovary measures 5.3 x 4.8 x 3.7 Cm. Moderate amount of pelvic free fluid. Pulsed Doppler evaluation of both ovaries demonstrates normal appearing low-resistance arterial and venous waveforms. IMPRESSION: 1. Probable early intrauterine gestational sac, but no fetal pole or cardiac activity yet visualized. Recommend follow-up quantitative B-HCG levels and follow-up US in 14 days to confirm and assess viability. This recommendation follows SRU consensus guidelines: Diagnostic Criteria for Nonviable Pregnancy Early in the First Trimester. Malva Limes Engl J Med 2013; 409:8119-14; 369:1443-51. 2. Normal bilateral ovaries.  No ovarian torsion. 3. Moderate pelvic free fluid. Electronically Signed   By: Elige KoHetal  Patel   On: 01/02/2016  16:25   [place] Art/ven Flow Abd Pelv Doppler  01/02/2016  CLINICAL DATA:  Abdominal pain. EXAM: OBSTETRIC <14 WK US AND TRANSVAGINAL OB US DOPPLER ULTRASOUND OF OVARIES TECHNIQUE: Both transabdominal and transvaginal ultrasound examinations were performed for complete evaluation of the gestation as well as the maternal uterus, adnexal regions, and pelvic cul-de-sac. Transvaginal technique was performed to assess early pregnancy. Color and duplex Doppler ultrasound was utilized to evaluate blood flow to the ovaries. COMPARISON:  None. FINDINGS: Intrauterine gestational sac: Single Yolk sac:  Present Embryo:  Not present Cardiac Activity: Not present MSD: 8  mm   5 w   4  d Subchorionic hemorrhage:  None visualized. Maternal uterus/adnexae: Normal bilateral ovaries. Right ovary measures 3 x 4.6 x 2.6 cm. Left ovary measures 5.3 x 4.8 x 3.7 Cm. Moderate amount of pelvic free fluid. Pulsed Doppler evaluation of both ovaries demonstrates normal appearing low-resistance arterial and venous waveforms. IMPRESSION: 1. Probable early intrauterine gestational sac, but no fetal pole or cardiac activity yet visualized. Recommend follow-up quantitative B-HCG levels and follow-up US in 14 days to confirm and assess viability. This recommendation follows SRU consensus guidelines: Diagnostic Criteria for Nonviable Pregnancy Early in the First Trimester. Malva Limes Engl J Med 2013; 782:9562-13; 369:1443-51. 2. Normal bilateral ovaries.  No ovarian torsion. 3. Moderate pelvic free fluid. Electronically Signed   By: Elige KoHetal  Patel   On: 01/02/2016 16:25    MAU Course  Procedures None  MDM Recent +UPT in the office available in Epic UA, wet prep, GC/chlamydia, CBC, ABO/Rh, quant hCG, HIV, RPR and US today to rule out ectopic pregnancy  Assessment and Plan  A: IUGS and YS at 7941w4d Abdominal pain in pregnancy, first trimester Bacterial vaginosis   P: Discharge home Rx for Flagyl given to patient  Discussed changes in hygiene products to avoid  recurrence First trimester precautions discussed Patient advised to follow-up with [facility] as scheduled for routine prenatal care or sooner PRN Patient may return to MAU as needed or if her  condition were to change or worsen   Marny Lowenstein, PA-C  01/02/2016, 5:22 PM

## 2016-01-02 NOTE — Discharge Instructions (Signed)
Bacterial Vaginosis °Bacterial vaginosis is an infection of the vagina. It happens when too many germs (bacteria) grow in the vagina. Having this infection puts you at risk for getting other infections from sex. Treating this infection can help lower your risk for other infections, such as:  °· Chlamydia. °· Gonorrhea. °· HIV. °· Herpes. °HOME CARE °· Take your medicine as told by your doctor. °· Finish your medicine even if you start to feel better. °· Tell your sex partner that you have an infection. They should see their doctor for treatment. °· During treatment: °¨ Avoid sex or use condoms correctly. °¨ Do not douche. °¨ Do not drink alcohol unless your doctor tells you it is ok. °¨ Do not breastfeed unless your doctor tells you it is ok. °GET HELP IF: °· You are not getting better after 3 days of treatment. °· You have more grey fluid (discharge) coming from your vagina than before. °· You have more pain than before. °· You have a fever. °MAKE SURE YOU:  °· Understand these instructions. °· Will watch your condition. °· Will get help right away if you are not doing well or get worse. °  °This information is not intended to replace advice given to you by your health care provider. Make sure you discuss any questions you have with your health care provider. °  °Document Released: 03/31/2008 Document Revised: 07/13/2014 Document Reviewed: 02/01/2013 °Elsevier Interactive Patient Education ©2016 Elsevier Inc. °First Trimester of Pregnancy °The first trimester of pregnancy is from week 1 until the end of week 12 (months 1 through 3). During this time, your baby will begin to develop inside you. At 6-8 weeks, the eyes and face are formed, and the heartbeat can be seen on ultrasound. At the end of 12 weeks, all the baby's organs are formed. Prenatal care is all the medical care you receive before the birth of your baby. Make sure you get good prenatal care and follow all of your doctor's instructions. °HOME CARE    °Medicines °· Take medicine only as told by your doctor. Some medicines are safe and some are not during pregnancy. °· Take your prenatal vitamins as told by your doctor. °· Take medicine that helps you poop (stool softener) as needed if your doctor says it is okay. °Diet °· Eat regular, healthy meals. °· Your doctor will tell you the amount of weight gain that is right for you. °· Avoid raw meat and uncooked cheese. °· If you feel sick to your stomach (nauseous) or throw up (vomit): °¨ Eat 4 or 5 small meals a day instead of 3 large meals. °¨ Try eating a few soda crackers. °¨ Drink liquids between meals instead of during meals. °· If you have a hard time pooping (constipation): °¨ Eat high-fiber foods like fresh vegetables, fruit, and whole grains. °¨ Drink enough fluids to keep your pee (urine) clear or pale yellow. °Activity and Exercise °· Exercise only as told by your doctor. Stop exercising if you have cramps or pain in your lower belly (abdomen) or low back. °· Try to avoid standing for long periods of time. Move your legs often if you must stand in one place for a long time. °· Avoid heavy lifting. °· Wear low-heeled shoes. Sit and stand up straight. °· You can have sex unless your doctor tells you not to. °Relief of Pain or Discomfort °· Wear a good support bra if your breasts are sore. °· Take warm water baths (sitz baths)   to soothe pain or discomfort caused by hemorrhoids. Use hemorrhoid cream if your doctor says it is okay. °· Rest with your legs raised if you have leg cramps or low back pain. °· Wear support hose if you have puffy, bulging veins (varicose veins) in your legs. Raise (elevate) your feet for 15 minutes, 3-4 times a day. Limit salt in your diet. °Prenatal Care °· Schedule your prenatal visits by the twelfth week of pregnancy. °· Write down your questions. Take them to your prenatal visits. °· Keep all your prenatal visits as told by your doctor. °Safety °· Wear your seat belt at all times  when driving. °· Make a list of emergency phone numbers. The list should include numbers for family, friends, the hospital, and police and fire departments. °General Tips °· Ask your doctor for a referral to a local prenatal class. Begin classes no later than at the start of month 6 of your pregnancy. °· Ask for help if you need counseling or help with nutrition. Your doctor can give you advice or tell you where to go for help. °· Do not use hot tubs, steam rooms, or saunas. °· Do not douche or use tampons or scented sanitary pads. °· Do not cross your legs for long periods of time. °· Avoid litter boxes and soil used by cats. °· Avoid all smoking, herbs, and alcohol. Avoid drugs not approved by your doctor. °· Do not use any tobacco products, including cigarettes, chewing tobacco, and electronic cigarettes. If you need help quitting, ask your doctor. You may get counseling or other support to help you quit. °· Visit your dentist. At home, brush your teeth with a soft toothbrush. Be gentle when you floss. °GET HELP IF: °· You are dizzy. °· You have mild cramps or pressure in your lower belly. °· You have a nagging pain in your belly area. °· You continue to feel sick to your stomach, throw up, or have watery poop (diarrhea). °· You have a bad smelling fluid coming from your vagina. °· You have pain with peeing (urination). °· You have increased puffiness (swelling) in your face, hands, legs, or ankles. °GET HELP RIGHT AWAY IF:  °· You have a fever. °· You are leaking fluid from your vagina. °· You have spotting or bleeding from your vagina. °· You have very bad belly cramping or pain. °· You gain or lose weight rapidly. °· You throw up blood. It may look like coffee grounds. °· You are around people who have German measles, fifth disease, or chickenpox. °· You have a very bad headache. °· You have shortness of breath. °· You have any kind of trauma, such as from a fall or a car accident. °  °This information is not  intended to replace advice given to you by your health care provider. Make sure you discuss any questions you have with your health care provider. °  °Document Released: 12/09/2007 Document Revised: 07/13/2014 Document Reviewed: 05/02/2013 °Elsevier Interactive Patient Education ©2016 Elsevier Inc. ° °

## 2016-01-03 LAB — GC/CHLAMYDIA PROBE AMP (~~LOC~~) NOT AT ARMC
CHLAMYDIA, DNA PROBE: NEGATIVE
Neisseria Gonorrhea: NEGATIVE

## 2016-01-03 LAB — RPR: RPR Ser Ql: NONREACTIVE

## 2016-01-03 LAB — HIV ANTIBODY (ROUTINE TESTING W REFLEX): HIV Screen 4th Generation wRfx: NONREACTIVE

## 2016-01-15 ENCOUNTER — Telehealth: Payer: Self-pay | Admitting: *Deleted

## 2016-01-16 ENCOUNTER — Other Ambulatory Visit: Payer: Medicaid Other

## 2016-01-16 DIAGNOSIS — O3680X0 Pregnancy with inconclusive fetal viability, not applicable or unspecified: Secondary | ICD-10-CM

## 2016-01-16 NOTE — Telephone Encounter (Signed)
See telephone note.

## 2016-01-16 NOTE — Telephone Encounter (Signed)
Patient has two encounters see note on second encounter for same problem.

## 2016-01-17 ENCOUNTER — Other Ambulatory Visit: Payer: Self-pay | Admitting: Certified Nurse Midwife

## 2016-01-17 LAB — BETA HCG QUANT (REF LAB): HCG QUANT: 44879 m[IU]/mL

## 2016-01-23 ENCOUNTER — Other Ambulatory Visit: Payer: Self-pay | Admitting: Certified Nurse Midwife

## 2016-01-23 ENCOUNTER — Telehealth: Payer: Self-pay | Admitting: *Deleted

## 2016-01-23 DIAGNOSIS — O0991 Supervision of high risk pregnancy, unspecified, first trimester: Secondary | ICD-10-CM

## 2016-01-23 NOTE — Telephone Encounter (Signed)
See phone note for this encounter. 

## 2016-02-04 ENCOUNTER — Ambulatory Visit (INDEPENDENT_AMBULATORY_CARE_PROVIDER_SITE_OTHER): Payer: Medicaid Other

## 2016-02-04 DIAGNOSIS — O0991 Supervision of high risk pregnancy, unspecified, first trimester: Secondary | ICD-10-CM

## 2016-02-11 ENCOUNTER — Encounter: Payer: Medicaid Other | Admitting: Certified Nurse Midwife

## 2016-02-12 ENCOUNTER — Ambulatory Visit (INDEPENDENT_AMBULATORY_CARE_PROVIDER_SITE_OTHER): Payer: Medicaid Other | Admitting: Certified Nurse Midwife

## 2016-02-12 ENCOUNTER — Encounter: Payer: Self-pay | Admitting: Obstetrics

## 2016-02-12 VITALS — BP 127/67 | HR 98 | Temp 98.8°F | Wt 188.4 lb

## 2016-02-12 DIAGNOSIS — Z331 Pregnant state, incidental: Secondary | ICD-10-CM | POA: Diagnosis not present

## 2016-02-12 DIAGNOSIS — O0901 Supervision of pregnancy with history of infertility, first trimester: Secondary | ICD-10-CM | POA: Diagnosis not present

## 2016-02-12 DIAGNOSIS — R7303 Prediabetes: Secondary | ICD-10-CM

## 2016-02-12 DIAGNOSIS — Z3401 Encounter for supervision of normal first pregnancy, first trimester: Secondary | ICD-10-CM | POA: Diagnosis not present

## 2016-02-12 DIAGNOSIS — Z1389 Encounter for screening for other disorder: Secondary | ICD-10-CM | POA: Diagnosis not present

## 2016-02-12 LAB — POCT URINALYSIS DIPSTICK
Bilirubin, UA: NEGATIVE
Blood, UA: NEGATIVE
Glucose, UA: NEGATIVE
Ketones, UA: NEGATIVE
Leukocytes, UA: NEGATIVE
Nitrite, UA: NEGATIVE
Protein, UA: NEGATIVE
Spec Grav, UA: 1.025
Urobilinogen, UA: NEGATIVE
pH, UA: 5

## 2016-02-12 NOTE — Addendum Note (Signed)
Addended by: Marya LandryFOSTER, Idalee Foxworthy D on: 02/12/2016 05:22 PM   Modules accepted: Orders

## 2016-02-12 NOTE — Progress Notes (Signed)
Patient reports she is doing well 

## 2016-02-12 NOTE — Progress Notes (Signed)
Subjective:    Deanna Walker is being seen today for her first obstetrical visit.  This is a planned pregnancy. She is at [redacted]w[redacted]d gestation. Her obstetrical history is significant for hx of PCOS, infertility and clomid pregnancy. Relationship with FOB: spouse, living together. Patient does intend to breast feed. Pregnancy history fully reviewed.  The information documented in the HPI was reviewed and verified.  Menstrual History: OB History    Gravida Para Term Preterm AB Living   1 0           SAB TAB Ectopic Multiple Live Births                  Menarche age: 26 Patient's last menstrual period was 11/28/2015.    Past Medical History:  Diagnosis Date  . Abnormal Pap smear 2011   colpo and biopsy done, normal paps since  . Boil   . Boil of buttock   . Boil, axilla   . Depression   . Diabetes mellitus    pre diabetes- using metformin to bring numbers down  . Obesity   . PCOS (polycystic ovarian syndrome)     Past Surgical History:  Procedure Laterality Date  . ABCESS DRAINAGE    . COLPOSCOPY       (Not in a hospital admission) No Known Allergies  Social History  Substance Use Topics  . Smoking status: Never Smoker  . Smokeless tobacco: Never Used  . Alcohol use No     Comment: occasionally     Family History  Problem Relation Age of Onset  . Diabetes Maternal Aunt   . Hypertension Maternal Aunt   . Seizures Mother   . Allergic rhinitis Neg Hx   . Angioedema Neg Hx   . Asthma Neg Hx   . Atopy Neg Hx   . Eczema Neg Hx   . Immunodeficiency Neg Hx   . Urticaria Neg Hx   . Cystic fibrosis Neg Hx   . Lupus Neg Hx   . Emphysema Neg Hx   . Migraines Neg Hx      Review of Systems Constitutional: negative for weight loss Gastrointestinal: negative for vomiting Genitourinary:negative for genital lesions and vaginal discharge and dysuria Musculoskeletal:negative for back pain Behavioral/Psych: negative for abusive relationship, depression, illegal drug usage  and tobacco use    Objective:    BP 127/67   Pulse 98   Temp 98.8 F (37.1 C)   Wt 188 lb 6.4 oz (85.5 kg)   LMP 11/28/2015   BMI 31.35 kg/m  General Appearance:    Alert, cooperative, no distress, appears stated age  Head:    Normocephalic, without obvious abnormality, atraumatic  Eyes:    PERRL, conjunctiva/corneas clear, EOM's intact, fundi    benign, both eyes  Ears:    Normal TM's and external ear canals, both ears  Nose:   Nares normal, septum midline, mucosa normal, no drainage    or sinus tenderness  Throat:   Lips, mucosa, and tongue normal; teeth and gums normal  Neck:   Supple, symmetrical, trachea midline, no adenopathy;    thyroid:  no enlargement/tenderness/nodules; no carotid   bruit or JVD  Back:     Symmetric, no curvature, ROM normal, no CVA tenderness  Lungs:     Clear to auscultation bilaterally, respirations unlabored  Chest Wall:    No tenderness or deformity   Heart:    Regular rate and rhythm, S1 and S2 normal, no murmur, rub   or  gallop  Breast Exam:    No tenderness, masses, or nipple abnormality  Abdomen:     Soft, non-tender, bowel sounds active all four quadrants,    no masses, no organomegaly  Genitalia:    Normal female without lesion, discharge or tenderness  Extremities:   Extremities normal, atraumatic, no cyanosis or edema  Pulses:   2+ and symmetric all extremities  Skin:   Skin color, texture, turgor normal, no rashes or lesions  Lymph nodes:   Cervical, supraclavicular, and axillary nodes normal  Neurologic:   CNII-XII intact, normal strength, sensation and reflexes    throughout     FHR: 155 by doppler.      Lab Review Urine pregnancy test Labs reviewed yes Radiologic studies reviewed yes Assessment:    Pregnancy at [redacted]w[redacted]d weeks   Pregnancy with clomid   Plan:      Prenatal vitamins.  Counseling provided regarding continued use of seat belts, cessation of alcohol consumption, smoking or use of illicit drugs; infection  precautions i.e., influenza/TDAP immunizations, toxoplasmosis,CMV, parvovirus, listeria and varicella; workplace safety, exercise during pregnancy; routine dental care, safe medications, sexual activity, hot tubs, saunas, pools, travel, caffeine use, fish and methlymercury, potential toxins, hair treatments, varicose veins Weight gain recommendations per IOM guidelines reviewed: underweight/BMI< 18.5--> gain 28 - 40 lbs; normal weight/BMI 18.5 - 24.9--> gain 25 - 35 lbs; overweight/BMI 25 - 29.9--> gain 15 - 25 lbs; obese/BMI >30->gain  11 - 20 lbs Problem list reviewed and updated. FIRST/CF mutation testing/NIPT/QUAD SCREEN/fragile X/Ashkenazi Jewish population testing/Spinal muscular atrophy discussed: ordered. Role of ultrasound in pregnancy discussed; fetal survey: requested. Amniocentesis discussed: not indicated. VBAC calculator score: VBAC consent form provided No orders of the defined types were placed in this encounter.  Orders Placed This Encounter  Procedures  . Culture, OB Urine  . TSH  . HIV antibody  . Hemoglobinopathy evaluation  . Varicella zoster antibody, IgG  . Prenatal Profile I  . MaterniT21 PLUS Core+SCA    Order Specific Question:   Is the patient insulin dependent?    Answer:   No    Order Specific Question:   Please enter gestational age. This should be expressed as weeks AND days, i.e. 16w 6d. Enter weeks here. Enter days in next question.    Answer:   12    Order Specific Question:   Please enter gestational age. This should be expressed as weeks AND days, i.e. 16w 6d. Enter days here. Enter weeks in previous question.    Answer:   6    Order Specific Question:   How was gestational age calculated?    Answer:   Ultrasound    Order Specific Question:   Please give the date of LMP OR Ultrasound OR Estimated date of delivery.    Answer:   08/27/2016    Order Specific Question:   Number of Fetuses (Type of Pregnancy):    Answer:   1    Order Specific Question:    Indications for performing the test? (please choose all that apply):    Answer:   Routine screening    Order Specific Question:   Other Indications? (Y=Yes, N=No)    Answer:   Y    Comments:   clomid preg    Order Specific Question:   If this is a repeat specimen, please indicate the reason:    Answer:   Not indicated    Order Specific Question:   Please specify the patient's race: (C=White/Caucasion, B=Black, I=Native American,  A=Asian, H=Hispanic, O=Other, U=Unknown)    Answer:   B    Order Specific Question:   Donor Egg - indicate if the egg was obtained from in vitro fertilization.    Answer:   N    Order Specific Question:   Age of Egg Donor.    Answer:   [age]    Order Specific Question:   Prior Down Syndrome/ONTD screening during current pregnancy.    Answer:   N    Order Specific Question:   Prior First Trimester Testing    Answer:   N    Order Specific Question:   Prior Second Trimester Testing    Answer:   N    Order Specific Question:   Family History of Neural Tube Defects    Answer:   N    Order Specific Question:   Prior Pregnancy with Down Syndrome    Answer:   N    Order Specific Question:   Please give the patient's weight (in pounds)    Answer:   188  . POCT urinalysis dipstick    Follow up in 4 weeks. 50% of 30 min visit spent on counseling and coordination of care.

## 2016-02-13 ENCOUNTER — Other Ambulatory Visit: Payer: Self-pay | Admitting: Certified Nurse Midwife

## 2016-02-13 ENCOUNTER — Encounter: Payer: Self-pay | Admitting: Obstetrics

## 2016-02-13 LAB — HEMOGLOBIN A1C
ESTIMATED AVERAGE GLUCOSE: 131 mg/dL
Hgb A1c MFr Bld: 6.2 % — ABNORMAL HIGH (ref 4.8–5.6)

## 2016-02-14 ENCOUNTER — Encounter (HOSPITAL_COMMUNITY): Payer: Self-pay

## 2016-02-14 LAB — CULTURE, OB URINE

## 2016-02-14 LAB — URINE CULTURE, OB REFLEX

## 2016-02-16 LAB — PAP IG AND HPV HIGH-RISK
HPV, HIGH-RISK: NEGATIVE
PAP Smear Comment: 0

## 2016-02-17 LAB — NUSWAB VG+, CANDIDA 6SP
CANDIDA LUSITANIAE, NAA: NEGATIVE
CANDIDA PARAPSILOSIS, NAA: NEGATIVE
CHLAMYDIA TRACHOMATIS, NAA: NEGATIVE
Candida albicans, NAA: NEGATIVE
Candida glabrata, NAA: NEGATIVE
Candida krusei, NAA: NEGATIVE
Candida tropicalis, NAA: NEGATIVE
Neisseria gonorrhoeae, NAA: NEGATIVE
TRICH VAG BY NAA: NEGATIVE

## 2016-02-19 ENCOUNTER — Other Ambulatory Visit: Payer: Self-pay | Admitting: Certified Nurse Midwife

## 2016-02-19 DIAGNOSIS — Z3401 Encounter for supervision of normal first pregnancy, first trimester: Secondary | ICD-10-CM

## 2016-02-19 LAB — PRENATAL PROFILE I(LABCORP)
Antibody Screen: NEGATIVE
BASOS ABS: 0 10*3/uL (ref 0.0–0.2)
BASOS: 0 %
EOS (ABSOLUTE): 0.1 10*3/uL (ref 0.0–0.4)
Eos: 1 %
HEMATOCRIT: 36 % (ref 34.0–46.6)
HEMOGLOBIN: 11.8 g/dL (ref 11.1–15.9)
Hepatitis B Surface Ag: NEGATIVE
Immature Grans (Abs): 0.1 10*3/uL (ref 0.0–0.1)
Immature Granulocytes: 1 %
LYMPHS ABS: 2.6 10*3/uL (ref 0.7–3.1)
Lymphs: 26 %
MCH: 26.2 pg — ABNORMAL LOW (ref 26.6–33.0)
MCHC: 32.8 g/dL (ref 31.5–35.7)
MCV: 80 fL (ref 79–97)
MONOCYTES: 9 %
MONOS ABS: 0.9 10*3/uL (ref 0.1–0.9)
NEUTROS ABS: 6.4 10*3/uL (ref 1.4–7.0)
Neutrophils: 63 %
PLATELETS: 373 10*3/uL (ref 150–379)
RBC: 4.51 x10E6/uL (ref 3.77–5.28)
RDW: 14.9 % (ref 12.3–15.4)
RPR: NONREACTIVE
Rh Factor: POSITIVE
Rubella Antibodies, IGG: 6.53 index (ref >0.99)
WBC: 10.1 10*3/uL (ref 3.4–10.8)

## 2016-02-19 LAB — MATERNIT21 PLUS CORE+SCA
CHROMOSOME 13: NEGATIVE
CHROMOSOME 18: NEGATIVE
CHROMOSOME 21: NEGATIVE
PDF: 0
Y Chromosome: NOT DETECTED

## 2016-02-19 LAB — HEMOGLOBINOPATHY EVALUATION
HGB A: 97.7 % (ref 94.0–98.0)
HGB C: 0 %
HGB S: 0 %
Hemoglobin A2 Quantitation: 2.3 % (ref 0.7–3.1)
Hemoglobin F Quantitation: 0 % (ref 0.0–2.0)

## 2016-02-19 LAB — TSH: TSH: 0.669 u[IU]/mL (ref 0.450–4.500)

## 2016-02-19 LAB — HIV ANTIBODY (ROUTINE TESTING W REFLEX): HIV SCREEN 4TH GENERATION: NONREACTIVE

## 2016-02-19 LAB — VARICELLA ZOSTER ANTIBODY, IGG: Varicella zoster IgG: >4000 index (ref >165)

## 2016-02-21 ENCOUNTER — Other Ambulatory Visit: Payer: Self-pay | Admitting: Certified Nurse Midwife

## 2016-02-24 ENCOUNTER — Telehealth: Payer: Self-pay | Admitting: Pediatrics

## 2016-02-24 NOTE — Telephone Encounter (Signed)
Pt called in requesting results from recent testing. I called her back, left message for return call.

## 2016-02-25 NOTE — Telephone Encounter (Signed)
Patient called for her results. 8/22 11:18 LM on VM to CB. Patient needs early 612GTT.

## 2016-02-26 ENCOUNTER — Telehealth: Payer: Self-pay | Admitting: *Deleted

## 2016-02-26 NOTE — Telephone Encounter (Signed)
Patient made aware of lab results and need for early 2 hour GTT.Patient has appointment on 02/09/16 at 1330 pm.GTT scheduled for 0930 on same day.Patient is aware of NPO after MN before test.

## 2016-03-11 ENCOUNTER — Other Ambulatory Visit: Payer: Medicaid Other

## 2016-03-11 ENCOUNTER — Encounter: Payer: Medicaid Other | Admitting: Certified Nurse Midwife

## 2016-03-11 ENCOUNTER — Ambulatory Visit (INDEPENDENT_AMBULATORY_CARE_PROVIDER_SITE_OTHER): Payer: Medicaid Other | Admitting: Certified Nurse Midwife

## 2016-03-11 VITALS — BP 127/68 | HR 85 | Wt 192.0 lb

## 2016-03-11 DIAGNOSIS — Z331 Pregnant state, incidental: Secondary | ICD-10-CM | POA: Diagnosis not present

## 2016-03-11 DIAGNOSIS — Z1389 Encounter for screening for other disorder: Secondary | ICD-10-CM

## 2016-03-11 DIAGNOSIS — Z3402 Encounter for supervision of normal first pregnancy, second trimester: Secondary | ICD-10-CM | POA: Diagnosis not present

## 2016-03-11 LAB — POCT URINALYSIS DIPSTICK
BILIRUBIN UA: NEGATIVE
Blood, UA: NEGATIVE
GLUCOSE UA: NEGATIVE
KETONES UA: NEGATIVE
LEUKOCYTES UA: NEGATIVE
Nitrite, UA: NEGATIVE
PH UA: 5
Protein, UA: NEGATIVE
Spec Grav, UA: 1.02
Urobilinogen, UA: NEGATIVE

## 2016-03-11 NOTE — Progress Notes (Signed)
  Subjective:    Deanna Walker is a 35 y.o. female being seen today for her obstetrical visit. She is at 672w6d gestation. Patient reports: backache, no bleeding, no contractions, no cramping and no leaking.  Problem List Items Addressed This Visit      Other   Supervision of normal first pregnancy in first trimester    Other Visit Diagnoses    Encounter for supervision of normal first pregnancy in second trimester    -  Primary   Relevant Orders   Glucose Tolerance, 2 Hours w/1 Hour   CBC   HIV antibody   RPR   POCT urinalysis dipstick (Completed)   US MFM OB DETAIL +14 WK     Patient Active Problem List   Diagnosis Date Noted  . Supervision of normal first pregnancy in first trimester 02/12/2016  . Chronic urticaria 09/25/2015  . Perennial and seasonal allergic rhinoconjunctivitis 09/25/2015  . Prediabetes 11/12/2013  . Infertility associated with anovulation 01/24/2013  . PCOS (polycystic ovarian syndrome) 02/11/2011    Objective:     BP 127/68   Pulse 85   Wt 192 lb (87.1 kg)   LMP 11/28/2015   BMI 31.95 kg/m  Uterine Size: Below umbilicus     Assessment:    Pregnancy @ 7672w6d  weeks Doing well   Lumbar back pain of pregnancy  Plan:    Problem list reviewed and updated. Labs reviewed.  Follow up in 4 weeks. FIRST/CF mutation testing/NIPT/QUAD SCREEN/fragile X/Ashkenazi Jewish population testing/Spinal muscular atrophy discussed: results reviewed. Role of ultrasound in pregnancy discussed; fetal survey: ordered. Amniocentesis discussed: not indicated. 50% of 15 minute visit spent on counseling and coordination of care.

## 2016-03-12 ENCOUNTER — Telehealth: Payer: Self-pay | Admitting: *Deleted

## 2016-03-12 ENCOUNTER — Other Ambulatory Visit: Payer: Self-pay | Admitting: Certified Nurse Midwife

## 2016-03-12 DIAGNOSIS — O0992 Supervision of high risk pregnancy, unspecified, second trimester: Secondary | ICD-10-CM

## 2016-03-12 DIAGNOSIS — O24419 Gestational diabetes mellitus in pregnancy, unspecified control: Secondary | ICD-10-CM | POA: Insufficient documentation

## 2016-03-12 DIAGNOSIS — Z3401 Encounter for supervision of normal first pregnancy, first trimester: Secondary | ICD-10-CM

## 2016-03-12 LAB — GLUCOSE TOLERANCE, 2 HOURS W/ 1HR
GLUCOSE, 1 HOUR: 216 mg/dL — AB (ref 65–179)
GLUCOSE, 2 HOUR: 210 mg/dL — AB (ref 65–152)
Glucose, Fasting: 91 mg/dL (ref 65–91)

## 2016-03-12 LAB — CBC
HEMATOCRIT: 34.5 % (ref 34.0–46.6)
Hemoglobin: 11.3 g/dL (ref 11.1–15.9)
MCH: 26.1 pg — AB (ref 26.6–33.0)
MCHC: 32.8 g/dL (ref 31.5–35.7)
MCV: 80 fL (ref 79–97)
Platelets: 368 10*3/uL (ref 150–379)
RBC: 4.33 x10E6/uL (ref 3.77–5.28)
RDW: 15.5 % — AB (ref 12.3–15.4)
WBC: 13 10*3/uL — ABNORMAL HIGH (ref 3.4–10.8)

## 2016-03-12 LAB — RPR: RPR: NONREACTIVE

## 2016-03-12 LAB — HIV ANTIBODY (ROUTINE TESTING W REFLEX): HIV Screen 4th Generation wRfx: NONREACTIVE

## 2016-03-12 NOTE — Telephone Encounter (Signed)
Patient made aware of result of 2 hour GTT.Patient understands to expect a call from MFM and was offered to start her diabetic teaching here with the nurse which she declined.Reminded not to eat sweets and watch her carbs which are anything white like bread,potato's sugar etc.

## 2016-03-12 NOTE — Telephone Encounter (Signed)
[facility] [address] [phone].

## 2016-03-12 NOTE — Telephone Encounter (Signed)
Left message with family to call office for lab result.Patient failed her 2 hour GTT.

## 2016-03-16 ENCOUNTER — Encounter: Payer: Medicaid Other | Attending: Certified Nurse Midwife | Admitting: Skilled Nursing Facility1

## 2016-03-16 DIAGNOSIS — O24419 Gestational diabetes mellitus in pregnancy, unspecified control: Secondary | ICD-10-CM | POA: Insufficient documentation

## 2016-03-16 DIAGNOSIS — Z713 Dietary counseling and surveillance: Secondary | ICD-10-CM | POA: Diagnosis present

## 2016-03-17 ENCOUNTER — Encounter: Payer: Self-pay | Admitting: Skilled Nursing Facility1

## 2016-03-17 NOTE — Progress Notes (Signed)
  Patient was seen on 03/16/2016 for Gestational Diabetes self-management class at the [facility]. The following learning objectives were met by the patient during this course:   States the definition of Gestational Diabetes  States why dietary management is important in controlling blood glucose  Describes the effects each nutrient has on blood glucose levels  Demonstrates ability to create a balanced meal plan  Demonstrates carbohydrate counting   States when to check blood glucose levels involving a total of 4 separate occurences in a day  Demonstrates proper blood glucose monitoring techniques  States the effect of stress and exercise on blood glucose levels  States the importance of limiting caffeine and abstaining from alcohol and smoking  Demonstrates the knowledge the glucometer provided in class may not be covered by their insurance and to call their insurance provider immediately after class to know which glucometer their insurance provider does cover as well as calling their physician the next day for a prescription to the glucometer their insurance does cover (if the one provided is not) as well as the lancets and strips for that meter.  Blood glucose monitor given: pt already had her own Blood glucose reading: 88  Patient instructed to monitor glucose levels: FBS: 60 - <90 1 hour: <140 2 hour: <120  *Patient received handouts:  Nutrition Diabetes and Pregnancy  Carbohydrate Counting List  Patient will be seen for follow-up as needed.

## 2016-03-18 ENCOUNTER — Encounter (HOSPITAL_COMMUNITY): Payer: Self-pay | Admitting: Certified Nurse Midwife

## 2016-03-31 ENCOUNTER — Ambulatory Visit (HOSPITAL_COMMUNITY)
Admission: RE | Admit: 2016-03-31 | Discharge: 2016-03-31 | Disposition: A | Discharge status: Home / Self Care | Payer: Medicaid Other | Source: Ambulatory Visit | Attending: Certified Nurse Midwife | Admitting: Certified Nurse Midwife

## 2016-03-31 ENCOUNTER — Other Ambulatory Visit: Payer: Self-pay | Admitting: Certified Nurse Midwife

## 2016-03-31 ENCOUNTER — Encounter (HOSPITAL_COMMUNITY): Payer: Self-pay

## 2016-03-31 DIAGNOSIS — Z3A18 18 weeks gestation of pregnancy: Secondary | ICD-10-CM | POA: Diagnosis not present

## 2016-03-31 DIAGNOSIS — O09812 Supervision of pregnancy resulting from assisted reproductive technology, second trimester: Secondary | ICD-10-CM

## 2016-03-31 DIAGNOSIS — O09512 Supervision of elderly primigravida, second trimester: Secondary | ICD-10-CM | POA: Diagnosis not present

## 2016-03-31 DIAGNOSIS — Z36 Encounter for antenatal screening of mother: Secondary | ICD-10-CM | POA: Diagnosis not present

## 2016-03-31 DIAGNOSIS — Z3402 Encounter for supervision of normal first pregnancy, second trimester: Secondary | ICD-10-CM

## 2016-03-31 DIAGNOSIS — O24312 Unspecified pre-existing diabetes mellitus in pregnancy, second trimester: Secondary | ICD-10-CM | POA: Diagnosis not present

## 2016-03-31 DIAGNOSIS — Z1389 Encounter for screening for other disorder: Secondary | ICD-10-CM

## 2016-03-31 NOTE — Consult Note (Signed)
Maternal Fetal Medicine Consultation  Requesting Provider(s): Valentina Lucksachelle Denny, CNM  Reason for consultation: Gestational diabetes  HPI: Deanna Walker is a 35 yo G1P0, EDD 08/27/2016 who is currently at 18w 5d seen for consultation due to recent diagnosis of gestational diabetes.  The patient reports a long history of PCOs and was previously on Metformin.  This was recently discontinued and a 2-hr OGTT was performed (fasting 91, 1-hr 216, 2-hr 210).  Her most recent HbA1C from August was 6.2 but has had HbA1C values as high at 6.4 in the past.  She is not currently on any oral hypoglycemic medications or insulin.  The patient reports that she had diabetic counseling / dietary consultation several weeks ago and is checking her fingerstick values 4x daily but did not bring in her fingerstick log.  Per her report, her fasting values have been in the 97-101 range and most of the post prandial values are normal but some as high at 165 mg/dl.   Deanna Walker previously had NIPT drawn that returned low risk for aneuploidy.  Her prenatal course has otherwise been uncomplicated.  Deanna Walker reports some swelling of the lower right foot - she denies any recent trauma / lower extremity pain or swelling. She is otherwise without complaints.  OB History: OB History    Gravida Para Term Preterm AB Living   1 0           SAB TAB Ectopic Multiple Live Births                  PMH:  Past Medical History:  Diagnosis Date  . Abnormal Pap smear 2011   colpo and biopsy done, normal paps since  . Boil   . Boil of buttock   . Boil, axilla   . Depression   . Diabetes mellitus    pre diabetes- using metformin to bring numbers down  . Gestational diabetes   . Obesity   . PCOS (polycystic ovarian syndrome)     PSH:  Past Surgical History:  Procedure Laterality Date  . ABCESS DRAINAGE    . COLPOSCOPY     Meds:  Current Outpatient Prescriptions on File Prior to Encounter  Medication Sig Dispense  Refill  . cetirizine (ZYRTEC) 10 MG tablet Take 1 tablet (10 mg total) by mouth daily. 30 tablet 5  . metFORMIN (GLUCOPHAGE) 1000 MG tablet take 1 tablet by mouth twice a day with food (Patient not taking: Reported on 03/31/2016) 60 tablet 6  . Prenat w/o A-FeCbn-DSS-FA-DHA (CITRANATAL HARMONY) 30-1-260 MG CAPS Take 1 tablet by mouth daily. 30 capsule 12   No current facility-administered medications on file prior to encounter.    Allergies: No Known Allergies   FH:  Family History  Problem Relation Age of Onset  . Diabetes Maternal Aunt   . Hypertension Maternal Aunt   . Seizures Mother   . Allergic rhinitis Neg Hx   . Angioedema Neg Hx   . Asthma Neg Hx   . Atopy Neg Hx   . Eczema Neg Hx   . Immunodeficiency Neg Hx   . Urticaria Neg Hx   . Cystic fibrosis Neg Hx   . Lupus Neg Hx   . Emphysema Neg Hx   . Migraines Neg Hx    Soc:  Social History   Social History  . Marital status: Single    Spouse name: N/A  . Number of children: N/A  . Years of education: N/A   Occupational History  .  Not on file.   Social History Main Topics  . Smoking status: Never Smoker  . Smokeless tobacco: Never Used  . Alcohol use No     Comment: occasionally   . Drug use: No     Comment: past marijuana   . Sexual activity: Yes    Partners: Male    Birth control/ protection: None   Other Topics Concern  . Not on file   Social History Narrative  . No narrative on file    PE:  There were no vitals filed for this visit.  GEN: well-appearing female ABD: gravid, NT EXT: right foot is mildly edematous compared to left foot.  No calf or thigh edema / pain noted.  Neg Homan's sign.  Ultrasound: Single IUP at 18w 5d Likely pre gestational diabetes (HbA1C 6.2) Normal fetal anatomic survey Anterior fundal placenta Normal amniotic fluid volume   A/P: 1) Single IUP at 18w 5d  2) Advanced maternal age - NIPT low risk for aneuploidy  3) Likely pre gestational diabetes - the patient  has a history of PCOS.  Her most recent HbA1C value was 6.2% and has been as high as 6.4% in the past.  The patient most likely has type 2 diabetes.  The patient has been seen for diabetic education and reports that she is checking fingerstick values 4x daily (fasting and 2 hrs post prandial) but unfortunately did not bring in her fingerstick values for review today.  4) Right foot swelling - the patient denies any recent trauma.  The right foot is slightly more swollen than the left.  No calf or thigh edema / pain noted.  Negative Homan's sign.  Feel likelihood of DVT is extremely unlikely- patient encouraged to come immediately to the MAU for further evaluation should the swelling or pain worsen or for any chest pain or shortness of breath.  Encouraged to elevate foot - may need further evaluation if no improvement noted.   Recommendations: 1) Continue to check fingerstick values 4x daily (fasting and 2 hr post prandial) 2) Follow up for MD evaluation in MFM in 2 weeks with fingerstick log for review.  If fasting or post prandial values are elevated would consider starting oral hypoglycemic medications at that time. 3) Recommend baseline 24-hr urine protein and CMP 4) Serial ultrasounds for growth every 4 weeks 5) Given that this is likely pre gestational diabetes - recommend fetal echo with Peds cardiology 6) Antenatal testing beginning at 32 weeks 7) Delivery at 39 weeks in the absence of other complications   Thank you for the opportunity to be a part of the care of Deanna Walker. Please contact our office if we can be of further assistance.   I spent approximately 30 minutes with this patient with over 50% of time spent in face-to-face counseling.  Alpha Gula, MD Maternal Fetal Medicine

## 2016-04-01 ENCOUNTER — Other Ambulatory Visit: Payer: Self-pay | Admitting: Certified Nurse Midwife

## 2016-04-01 ENCOUNTER — Other Ambulatory Visit (HOSPITAL_COMMUNITY): Payer: Self-pay | Admitting: *Deleted

## 2016-04-01 DIAGNOSIS — O24312 Unspecified pre-existing diabetes mellitus in pregnancy, second trimester: Secondary | ICD-10-CM

## 2016-04-02 ENCOUNTER — Other Ambulatory Visit (HOSPITAL_COMMUNITY): Payer: Self-pay

## 2016-04-02 ENCOUNTER — Other Ambulatory Visit (HOSPITAL_COMMUNITY): Payer: Self-pay | Admitting: *Deleted

## 2016-04-02 ENCOUNTER — Encounter: Payer: Self-pay | Admitting: Certified Nurse Midwife

## 2016-04-02 DIAGNOSIS — O24319 Unspecified pre-existing diabetes mellitus in pregnancy, unspecified trimester: Secondary | ICD-10-CM

## 2016-04-03 ENCOUNTER — Ambulatory Visit (INDEPENDENT_AMBULATORY_CARE_PROVIDER_SITE_OTHER): Payer: Medicaid Other | Admitting: Obstetrics

## 2016-04-03 ENCOUNTER — Encounter: Payer: Self-pay | Admitting: Obstetrics

## 2016-04-03 VITALS — BP 114/72 | HR 90

## 2016-04-03 DIAGNOSIS — B373 Candidiasis of vulva and vagina: Secondary | ICD-10-CM

## 2016-04-03 DIAGNOSIS — B3731 Acute candidiasis of vulva and vagina: Secondary | ICD-10-CM

## 2016-04-03 DIAGNOSIS — A499 Bacterial infection, unspecified: Secondary | ICD-10-CM

## 2016-04-03 DIAGNOSIS — N760 Acute vaginitis: Secondary | ICD-10-CM

## 2016-04-03 DIAGNOSIS — Z3492 Encounter for supervision of normal pregnancy, unspecified, second trimester: Secondary | ICD-10-CM

## 2016-04-03 DIAGNOSIS — B9689 Other specified bacterial agents as the cause of diseases classified elsewhere: Secondary | ICD-10-CM

## 2016-04-03 NOTE — Addendum Note (Signed)
Addended by: Marya LandryFOSTER, Jerrye Seebeck D on: 04/03/2016 04:25 PM   Modules accepted: Orders

## 2016-04-03 NOTE — Progress Notes (Signed)
Subjective:    Deanna Walker is a 35 y.o. female being seen today for her obstetrical visit. She is at 7022w1d gestation. Patient reports: vaginal irritation and bumps on labia, tender initially but now going away . Fetal movement: normal.  Problem List Items Addressed This Visit    None    Visit Diagnoses   None.    Patient Active Problem List   Diagnosis Date Noted  . Gestational diabetes 03/12/2016  . Supervision of normal first pregnancy in first trimester 02/12/2016  . Chronic urticaria 09/25/2015  . Perennial and seasonal allergic rhinoconjunctivitis 09/25/2015  . Prediabetes 11/12/2013  . Infertility associated with anovulation 01/24/2013  . PCOS (polycystic ovarian syndrome) 02/11/2011   Objective:    BP 114/72   Pulse 90   LMP 11/28/2015  FHT: 150 BPM  Uterine Size: size equals dates            External Genitalia:  Few furuncles left labia, non tender  Assessment:    Pregnancy @ 6022w1d     Left labial furuncles   Plan:    Triple antibiotic ointment Rx   Signs and symptoms of preterm labor: discussed.  Labs, problem list reviewed and updated 2 hr GTT planned Follow up in 4 weeks.  Patient ID: Deanna Walker, female   DOB: Nov 22, 1980, 35 y.o.   MRN: [id number] Patient ID: Deanna Walker, female   DOB: Nov 22, 1980, 35 y.o.   MRN: [id number]

## 2016-04-06 LAB — HERPES SIMPLEX VIRUS CULTURE

## 2016-04-13 ENCOUNTER — Encounter (HOSPITAL_COMMUNITY): Payer: Self-pay

## 2016-04-13 ENCOUNTER — Inpatient Hospital Stay (HOSPITAL_COMMUNITY)
Admission: AD | Admit: 2016-04-13 | Discharge: 2016-04-13 | Disposition: A | Discharge status: Home / Self Care | Payer: Medicaid Other | Source: Ambulatory Visit | Attending: Obstetrics and Gynecology | Admitting: Obstetrics and Gynecology

## 2016-04-13 DIAGNOSIS — R1030 Lower abdominal pain, unspecified: Secondary | ICD-10-CM | POA: Insufficient documentation

## 2016-04-13 DIAGNOSIS — Z7984 Long term (current) use of oral hypoglycemic drugs: Secondary | ICD-10-CM | POA: Diagnosis not present

## 2016-04-13 DIAGNOSIS — O9989 Other specified diseases and conditions complicating pregnancy, childbirth and the puerperium: Secondary | ICD-10-CM | POA: Diagnosis not present

## 2016-04-13 DIAGNOSIS — R102 Pelvic and perineal pain: Secondary | ICD-10-CM | POA: Diagnosis not present

## 2016-04-13 DIAGNOSIS — O26892 Other specified pregnancy related conditions, second trimester: Secondary | ICD-10-CM | POA: Diagnosis not present

## 2016-04-13 DIAGNOSIS — R7303 Prediabetes: Secondary | ICD-10-CM | POA: Insufficient documentation

## 2016-04-13 DIAGNOSIS — O26899 Other specified pregnancy related conditions, unspecified trimester: Secondary | ICD-10-CM

## 2016-04-13 DIAGNOSIS — Z3A20 20 weeks gestation of pregnancy: Secondary | ICD-10-CM | POA: Insufficient documentation

## 2016-04-13 DIAGNOSIS — R109 Unspecified abdominal pain: Secondary | ICD-10-CM

## 2016-04-13 LAB — URINALYSIS, ROUTINE W REFLEX MICROSCOPIC
BILIRUBIN URINE: NEGATIVE
GLUCOSE, UA: NEGATIVE mg/dL
Hgb urine dipstick: NEGATIVE
KETONES UR: NEGATIVE mg/dL
Leukocytes, UA: NEGATIVE
NITRITE: NEGATIVE
PH: 6.5 (ref 5.0–8.0)
PROTEIN: NEGATIVE mg/dL
Specific Gravity, Urine: 1.01 (ref 1.005–1.030)

## 2016-04-13 NOTE — Discharge Instructions (Signed)
Round Ligament Pain The round ligament is a cord of muscle and tissue that helps to support the uterus. It can become a source of pain during pregnancy if it becomes stretched or twisted as the baby grows. The pain usually begins in the second trimester of pregnancy, and it can come and go until the baby is delivered. It is not a serious problem, and it does not cause harm to the baby. Round ligament pain is usually a short, sharp, and pinching pain, but it can also be a dull, lingering, and aching pain. The pain is felt in the lower side of the abdomen or in the groin. It usually starts deep in the groin and moves up to the outside of the hip area. Pain can occur with:  A sudden change in position.  Rolling over in bed.  Coughing or sneezing.  Physical activity. HOME CARE INSTRUCTIONS Watch your condition for any changes. Take these steps to help with your pain:  When the pain starts, relax. Then try:  Sitting down.  Flexing your knees up to your abdomen.  Lying on your side with one pillow under your abdomen and another pillow between your legs.  Sitting in a warm bath for 15-20 minutes or until the pain goes away.  Take over-the-counter and prescription medicines only as told by your health care provider.  Move slowly when you sit and stand.  Avoid long walks if they cause pain.  Stop or lessen your physical activities if they cause pain. SEEK MEDICAL CARE IF:  Your pain does not go away with treatment.  You feel pain in your back that you did not have before.  Your medicine is not helping. SEEK IMMEDIATE MEDICAL CARE IF:  You develop a fever or chills.  You develop uterine contractions.  You develop vaginal bleeding.  You develop nausea or vomiting.  You develop diarrhea.  You have pain when you urinate.   This information is not intended to replace advice given to you by your health care provider. Make sure you discuss any questions you have with your health  care provider.   Document Released: 03/31/2008 Document Revised: 09/14/2011 Document Reviewed: 08/29/2014 Elsevier Interactive Patient Education 2016 Elsevier Inc.      Abdominal Pain During Pregnancy Belly (abdominal) pain is common during pregnancy. Most of the time, it is not a serious problem. Other times, it can be a sign that something is wrong with the pregnancy. Always tell your doctor if you have belly pain. HOME CARE Monitor your belly pain for any changes. The following actions may help you feel better:  Do not have sex (intercourse) or put anything in your vagina until you feel better.  Rest until your pain stops.  Drink clear fluids if you feel sick to your stomach (nauseous). Do not eat solid food until you feel better.  Only take medicine as told by your doctor.  Keep all doctor visits as told. GET HELP RIGHT AWAY IF:   You are bleeding, leaking fluid, or pieces of tissue come out of your vagina.  You have more pain or cramping.  You keep throwing up (vomiting).  You have pain when you pee (urinate) or have blood in your pee.  You have a fever.  You do not feel your baby moving as much.  You feel very weak or feel like passing out.  You have trouble breathing, with or without belly pain.  You have a very bad headache and belly pain.  You  have fluid leaking from your vagina and belly pain.  You keep having watery poop (diarrhea).  Your belly pain does not go away after resting, or the pain gets worse. MAKE SURE YOU:   Understand these instructions.  Will watch your condition.  Will get help right away if you are not doing well or get worse.   This information is not intended to replace advice given to you by your health care provider. Make sure you discuss any questions you have with your health care provider.   Document Released: 06/10/2009 Document Revised: 02/22/2013 Document Reviewed: 01/19/2013 Elsevier Interactive Patient Education [id number]2016  Elsevier Inc.

## 2016-04-13 NOTE — MAU Note (Signed)
Pt presents to MAU with complaints of lower abdominal cramping with occasional sharp pains since last night. PT denies any vaginal bleeding or abnormal discharge

## 2016-04-13 NOTE — MAU Provider Note (Signed)
History     CSN: [id number]  Arrival date and time: 04/13/16 1340   First Provider Initiated Contact with Patient 04/13/16 1421      Chief Complaint  Patient presents with  . Abdominal Pain   HPI Art BuffLetitia M Notarianni is a 35 y.o. G1P0 at 7154w4d who presents with abdominal cramping. Pain started late last night. Describes as intermittent lower abdominal cramping & sharp lower abdominal pain. Rates pain 6/10. Sharp pains worse with movement. Nothing makes cramping better or worse. Denies vaginal bleeding or LOF. Positive fetal movement. No recent intercourse. Denies n/v/d, constipation, or dysuria.   OB History    Gravida Para Term Preterm AB Living   1 0           SAB TAB Ectopic Multiple Live Births                  Past Medical History:  Diagnosis Date  . Abnormal Pap smear 2011   colpo and biopsy done, normal paps since  . Boil   . Boil of buttock   . Boil, axilla   . Depression   . Diabetes mellitus    pre diabetes- using metformin to bring numbers down  . Gestational diabetes   . Obesity   . PCOS (polycystic ovarian syndrome)     Past Surgical History:  Procedure Laterality Date  . ABCESS DRAINAGE    . COLPOSCOPY      Family History  Problem Relation Age of Onset  . Diabetes Maternal Aunt   . Hypertension Maternal Aunt   . Seizures Mother   . Allergic rhinitis Neg Hx   . Angioedema Neg Hx   . Asthma Neg Hx   . Atopy Neg Hx   . Eczema Neg Hx   . Immunodeficiency Neg Hx   . Urticaria Neg Hx   . Cystic fibrosis Neg Hx   . Lupus Neg Hx   . Emphysema Neg Hx   . Migraines Neg Hx     Social History  Substance Use Topics  . Smoking status: Never Smoker  . Smokeless tobacco: Never Used  . Alcohol use No     Comment: occasionally     Allergies: No Known Allergies  Prescriptions Prior to Admission  Medication Sig Dispense Refill Last Dose  . cetirizine (ZYRTEC) 10 MG tablet Take 1 tablet (10 mg total) by mouth daily. 30 tablet 5 Taking  . metFORMIN  (GLUCOPHAGE) 1000 MG tablet take 1 tablet by mouth twice a day with food (Patient not taking: Reported on 03/31/2016) 60 tablet 6 Not Taking  . Prenat w/o A-FeCbn-DSS-FA-DHA (CITRANATAL HARMONY) 30-1-260 MG CAPS Take 1 tablet by mouth daily. 30 capsule 12 Taking    Review of Systems  Constitutional: Negative.   Gastrointestinal: Positive for abdominal pain. Negative for constipation, diarrhea, nausea and vomiting.  Genitourinary: Negative.    Physical Exam   Blood pressure 127/59, pulse 96, temperature 98.3 F (36.8 C), resp. rate 18, last menstrual period 11/28/2015.  Physical Exam  Nursing note and vitals reviewed. Constitutional: She is oriented to person, place, and time. She appears well-developed and well-nourished. No distress.  HENT:  Head: Normocephalic and atraumatic.  Eyes: Conjunctivae are normal. Right eye exhibits no discharge. Left eye exhibits no discharge. No scleral icterus.  Neck: Normal range of motion.  Cardiovascular: Normal rate, regular rhythm and normal heart sounds.   No murmur heard. Respiratory: Effort normal and breath sounds normal. No respiratory distress. She has no wheezes.  GI: Soft.  Bowel sounds are normal. There is tenderness in the right lower quadrant, suprapubic area and left lower quadrant. There is no guarding.  Neurological: She is alert and oriented to person, place, and time.  Skin: Skin is warm and dry. She is not diaphoretic.  Psychiatric: She has a normal mood and affect. Her behavior is normal. Judgment and thought content normal.   Dilation: Closed Effacement (%): Thick Cervical Position: Posterior Exam by:: Judeth Horn NP   MAU Course  Procedures Results for orders placed or performed during the hospital encounter of 04/13/16 (from the past 24 hour(s))  Urinalysis, Routine w reflex microscopic (not at [facility])     Status: None   Collection Time: 04/13/16  1:55 PM  Result Value Ref Range   Color, Urine YELLOW YELLOW   APPearance  CLEAR CLEAR   Specific Gravity, Urine 1.010 1.005 - 1.030   pH 6.5 5.0 - 8.0   Glucose, UA NEGATIVE NEGATIVE mg/dL   Hgb urine dipstick NEGATIVE NEGATIVE   Bilirubin Urine NEGATIVE NEGATIVE   Ketones, ur NEGATIVE NEGATIVE mg/dL   Protein, ur NEGATIVE NEGATIVE mg/dL   Nitrite NEGATIVE NEGATIVE   Leukocytes, UA NEGATIVE NEGATIVE    MDM FHT 151 by doppler Cervix closed U/a WNL VSS, NAD Assessment and Plan  A: 1. Pain of round ligament affecting pregnancy, antepartum   2. Abdominal cramping affecting pregnancy    P: Discharge home Start wearing maternity support belt & slow position changes Discussed reasons to return to MAU Keep f/u appt with OB  Judeth Horn 04/13/2016, 2:20 PM

## 2016-04-14 ENCOUNTER — Ambulatory Visit (HOSPITAL_COMMUNITY): Admission: RE | Admit: 2016-04-14 | Payer: Medicaid Other | Source: Ambulatory Visit

## 2016-04-28 ENCOUNTER — Ambulatory Visit (HOSPITAL_COMMUNITY)
Admission: RE | Admit: 2016-04-28 | Discharge: 2016-04-28 | Disposition: A | Discharge status: Home / Self Care | Payer: Medicaid Other | Source: Ambulatory Visit | Attending: Certified Nurse Midwife | Admitting: Certified Nurse Midwife

## 2016-04-28 ENCOUNTER — Encounter (HOSPITAL_COMMUNITY): Payer: Self-pay | Admitting: *Deleted

## 2016-04-28 ENCOUNTER — Encounter (HOSPITAL_COMMUNITY): Payer: Self-pay

## 2016-04-28 ENCOUNTER — Inpatient Hospital Stay (HOSPITAL_COMMUNITY)
Admission: AD | Admit: 2016-04-28 | Discharge: 2016-05-04 | DRG: 775 | Disposition: A | Discharge status: Home / Self Care | Payer: Medicaid Other | Source: Ambulatory Visit | Attending: Obstetrics & Gynecology | Admitting: Obstetrics & Gynecology

## 2016-04-28 DIAGNOSIS — O24425 Gestational diabetes mellitus in childbirth, controlled by oral hypoglycemic drugs: Secondary | ICD-10-CM | POA: Diagnosis present

## 2016-04-28 DIAGNOSIS — Z3A22 22 weeks gestation of pregnancy: Secondary | ICD-10-CM

## 2016-04-28 DIAGNOSIS — Z8249 Family history of ischemic heart disease and other diseases of the circulatory system: Secondary | ICD-10-CM

## 2016-04-28 DIAGNOSIS — O26872 Cervical shortening, second trimester: Secondary | ICD-10-CM | POA: Diagnosis present

## 2016-04-28 DIAGNOSIS — Z833 Family history of diabetes mellitus: Secondary | ICD-10-CM

## 2016-04-28 DIAGNOSIS — O24312 Unspecified pre-existing diabetes mellitus in pregnancy, second trimester: Secondary | ICD-10-CM

## 2016-04-28 DIAGNOSIS — O9081 Anemia of the puerperium: Secondary | ICD-10-CM | POA: Diagnosis not present

## 2016-04-28 DIAGNOSIS — D649 Anemia, unspecified: Secondary | ICD-10-CM | POA: Diagnosis not present

## 2016-04-28 DIAGNOSIS — O3432 Maternal care for cervical incompetence, second trimester: Principal | ICD-10-CM | POA: Diagnosis present

## 2016-04-28 DIAGNOSIS — O4693 Antepartum hemorrhage, unspecified, third trimester: Secondary | ICD-10-CM

## 2016-04-28 LAB — TYPE AND SCREEN
ABO/RH(D): B POS
ANTIBODY SCREEN: NEGATIVE

## 2016-04-28 LAB — URINE MICROSCOPIC-ADD ON

## 2016-04-28 LAB — GLUCOSE, CAPILLARY: Glucose-Capillary: 145 mg/dL — ABNORMAL HIGH (ref 65–99)

## 2016-04-28 LAB — URINALYSIS, ROUTINE W REFLEX MICROSCOPIC
BILIRUBIN URINE: NEGATIVE
Glucose, UA: 250 mg/dL — AB
Hgb urine dipstick: NEGATIVE
KETONES UR: NEGATIVE mg/dL
NITRITE: NEGATIVE
PROTEIN: NEGATIVE mg/dL
Specific Gravity, Urine: 1.015 (ref 1.005–1.030)
pH: 6.5 (ref 5.0–8.0)

## 2016-04-28 MED ORDER — ZOLPIDEM TARTRATE 5 MG PO TABS
5.0000 mg | ORAL_TABLET | Freq: Every evening | ORAL | Status: DC | PRN
  Administered 2016-05-02: 5 mg via ORAL
  Filled 2016-04-28: qty 1

## 2016-04-28 MED ORDER — PRENATAL MULTIVITAMIN CH
1.0000 | ORAL_TABLET | Freq: Every day | ORAL | Status: DC
  Administered 2016-04-29 – 2016-05-01 (×3): 1 via ORAL
  Filled 2016-04-28 (×2): qty 1

## 2016-04-28 MED ORDER — BETAMETHASONE SOD PHOS & ACET 6 (3-3) MG/ML IJ SUSP
12.0000 mg | INTRAMUSCULAR | Status: AC
  Administered 2016-04-28 – 2016-04-29 (×2): 12 mg via INTRAMUSCULAR
  Filled 2016-04-28 (×2): qty 2

## 2016-04-28 MED ORDER — ACETAMINOPHEN 325 MG PO TABS: 650.0000 mg | ORAL_TABLET | ORAL | Status: DC | PRN

## 2016-04-28 MED ORDER — DOCUSATE SODIUM 100 MG PO CAPS
100.0000 mg | ORAL_CAPSULE | Freq: Every day | ORAL | Status: DC
  Administered 2016-04-30 – 2016-05-01 (×2): 100 mg via ORAL
  Filled 2016-04-28 (×2): qty 1

## 2016-04-28 MED ORDER — CALCIUM CARBONATE ANTACID 500 MG PO CHEW: 2.0000 | CHEWABLE_TABLET | ORAL | Status: DC | PRN

## 2016-04-28 NOTE — Consult Note (Signed)
[place]  Consultation Service: Neonatology   Dr. Ilda Basset has asked for consultation on Ms. Laconte with MRN# [id number] regarding the care of a premature infant at 81 5/7 weeks. Thank you for inviting [place] to see this patient.   Reason for consult:  Explain the possible complications, the prognosis, and the care of a premature infant at 57 5/[redacted] weeks EGA.   Chief complaint: 35yo female with a 22 5/7 week IUP with an estimated weight of 488 grams based on 10/24 [place].   My key findings of this patient's HPI are:  I have reviewed the patient's chart and have met with her. The salient information is as follows:   35yo who presented for routine outpatient follow up noted to have cervical shortening and dilation concerning enough admission for potential threatening delivery.  Denies ROM or labor.  She is otherwise feeling well.  H/o PCOS and GDM for which on Metformin, as well as abscess.    Prenatal labs:  Reassuring including negative mat21   Prenatal care:   good Pregnancy complications:  cervical incompetence Maternal antibiotics: n/a Maternal Steroids: started   Most recent dose:  #1 10/24 at ~1800    My recommendations for this patient and my actions included:   1. In the presence of the mother and father, I spent 25 minutes discussing the possible complications and outcomes of prematurity at this gestational age. I gave the patient a [employer] of [employer] handout, written in lay language, that discussed the common complications and survival data of the premature infant and a summary handout with graph and table. I discussed the potential need for resuscitation at birth, mechanical ventilation and surfactant administration for respiratory distress, IV fluids pending establishment of enteral feeds (encouraged breast milk feeding to which she planned to do and use of donor if she does not or her milk is not of sufficient quantity), antibiotics for possible sepsis, temperature  support, and monitoring. I also discussed the potential risk of complications such as intracranial hemorrhage, retinopathy, hearing deficit, and chronic lung disease. I also discussed the potential length of stay in the neonatal intensive care unit for about 17 weeks. I discussed this with parents in detail and they expressed an understanding of the risks and complications of prematurity.   2. I also discussed the expected survival of an infant born at 68 5/[redacted] weeks EGA, which is <1%. We further discussed that roughly 99% of neonates born at this age have severe neurological complications and that around 100% have school difficulties. She expressed an understanding of this information.   3. They both express desire for our medical team to assess and intervene if their child were to deliver at this time and if we deem her mature enough to do so.  Based on this, I informed her that the NICU team would be present at the delivery to make an assessment.  They understand that some difficult decisions may have to be made.    4. A visit to the NICU by the infant's mother and/or a significant other was encouraged.    Final Impression:  35yo  female with a 40 5/7 wk IUP who is threatening to deliver and who now understands the possible complications and prognosis of her infant.     ______________________________________________________________________  Thank you for asking [place] to participate in the care of this patient. Please do not hesitate to contact us again if you are aware of any further ways we can be of  assistance.   Sincerely,  Monia Sabal. Katherina Mires, MD Neonatologist  I spent ~40 minutes in consultation time, of which 25 minutes was spent in direct face to face counseling.

## 2016-04-28 NOTE — H&P (Signed)
ANTEPARTUM ADMISSION HISTORY AND PHYSICAL NOTE Cc: vaginal discharge for one day  History of Present Illness: [name] is a 35 y.o. G1P0 at 6214w5d admitted for short cervix on US today and cervix 2 cm dilated on  inspection.   Patient reports the fetal movement as active. Patient reports uterine contraction  activity as none. Patient reports  vaginal bleeding as none. Patient describes fluid per vagina as Other mucus. Fetal presentation is unsure.  Patient Active Problem List   Diagnosis Date Noted  . Short cervix in second trimester, antepartum 04/28/2016  . Gestational diabetes 03/12/2016  . Supervision of normal first pregnancy in first trimester 02/12/2016  . Chronic urticaria 09/25/2015  . Perennial and seasonal allergic rhinoconjunctivitis 09/25/2015  . Prediabetes 11/12/2013  . Infertility associated with anovulation 01/24/2013  . PCOS (polycystic ovarian syndrome) 02/11/2011    Past Medical History:  Diagnosis Date  . Abnormal Pap smear 2011   colpo and biopsy done, normal paps since  . Boil   . Boil of buttock   . Boil, axilla   . Depression   . Diabetes mellitus    pre diabetes- using metformin to bring numbers down  . Gestational diabetes   . Obesity   . PCOS (polycystic ovarian syndrome)     Past Surgical History:  Procedure Laterality Date  . ABCESS DRAINAGE    . COLPOSCOPY      OB History  Gravida Para Term Preterm AB Living  1 0          SAB TAB Ectopic Multiple Live Births               # Outcome Date GA Lbr Len/2nd Weight Sex Delivery Anes PTL Lv  1 Current               Social History   Social History  . Marital status: Single    Spouse name: N/A  . Number of children: N/A  . Years of education: N/A   Social History Main Topics  . Smoking status: Never Smoker  . Smokeless tobacco: Never Used  . Alcohol use No     Comment: occasionally   . Drug use: No     Comment: past marijuana   . Sexual activity: Yes    Partners:  Male    Birth control/ protection: None   Other Topics Concern  . None   Social History Narrative  . None    Family History  Problem Relation Age of Onset  . Diabetes Maternal Aunt   . Hypertension Maternal Aunt   . Seizures Mother   . Allergic rhinitis Neg Hx   . Angioedema Neg Hx   . Asthma Neg Hx   . Atopy Neg Hx   . Eczema Neg Hx   . Immunodeficiency Neg Hx   . Urticaria Neg Hx   . Cystic fibrosis Neg Hx   . Lupus Neg Hx   . Emphysema Neg Hx   . Migraines Neg Hx     No Known Allergies  Prescriptions Prior to Admission  Medication Sig Dispense Refill Last Dose  . cetirizine (ZYRTEC) 10 MG tablet Take 1 tablet (10 mg total) by mouth daily. 30 tablet 5 04/27/2016 at 2230  . metFORMIN (GLUCOPHAGE) 1000 MG tablet take 1 tablet by mouth twice a day with food 60 tablet 6 More than a month at Unknown time  . Prenat w/o A-[id number]-[id number]-FA-DHA (CITRANATAL HARMONY) 30-1-260 MG CAPS Take 1 tablet by mouth daily. 30 capsule 12  04/28/2016 at 1345    Review of Systems - Genito-Urinary ROS: positive for - urinary frequency/urgency  Vitals:  BP 132/66   Pulse 96   Temp 98.8 F (37.1 C) (Oral)   Resp 18   Ht 5\' 5"  (1.651 m)   Wt 197 lb 12.8 oz (89.7 kg)   LMP 11/28/2015   SpO2 100%   BMI 32.92 kg/m  Physical Examination: CONSTITUTIONAL: Well-developed, well-nourished female in no acute distress.  HENT:  Normocephalic, atraumatic, External right and left ear normal. Oropharynx is clear and moist EYES: Conjunctivae and EOM are normal. Pupils are equal, round, and reactive to light. No scleral icterus.  NECK: Normal range of motion, supple, no masses SKIN: Skin is warm and dry. No rash noted. Not diaphoretic. No erythema. No pallor. NEUROLGIC: Alert and oriented to person, place, and time. Normal reflexes, muscle tone coordination. No cranial nerve deficit noted. PSYCHIATRIC: Normal mood and affect. Normal behavior. Normal judgment and thought content. CARDIOVASCULAR: Normal  heart rate noted, regular rhythm RESPIRATORY: Effort and breath sounds normal, no problems with respiration noted ABDOMEN: Soft, nontender, nondistended, gravid. MUSCULOSKELETAL: Normal range of motion. No edema and no tenderness. 2+ distal pulses.  Cervix: Not evaluated. Membranes:intact Fetal Monitoring: Fetal Heart Rate A  Mode Doppler filed at 04/28/2016 1823  Baseline Rate (A) 147 bpm filed at 04/28/2016 1823      Labs:  No results found for this or any previous visit (from the past 24 hour(s)).  Imaging Studies: [place] Mfm Ob Detail +14 Wk  Result Date: 03/31/2016 OBSTETRICAL ULTRASOUND: This exam was performed within a [facility]. The OB US report was generated in the AS system, and faxed to the ordering physician.  This report is available in the [employer]. See the AS Obstetric US report via the Image Link.  [place] Mfm Ob Follow Up  Result Date: 04/28/2016 OBSTETRICAL ULTRASOUND: This exam was performed within a [facility]. The OB US report was generated in the AS system, and faxed to the ordering physician.  This report is available in the [employer]. See the AS Obstetric US report via the Image Link.    Assessment and Plan: Patient Active Problem List   Diagnosis Date Noted  . Short cervix in second trimester, antepartum 04/28/2016  . Gestational diabetes 03/12/2016  . Supervision of normal first pregnancy in first trimester 02/12/2016  . Chronic urticaria 09/25/2015  . Perennial and seasonal allergic rhinoconjunctivitis 09/25/2015  . Prediabetes 11/12/2013  . Infertility associated with anovulation 01/24/2013  . PCOS (polycystic ovarian syndrome) 02/11/2011   Admit to Antenatal. Betamethasone injections and observe for s/sx of PTL. Routine antenatal care  ARNOLD,JAMES, MD Attending physician Faculty Practice, [facility]

## 2016-04-29 ENCOUNTER — Encounter (HOSPITAL_COMMUNITY): Payer: Self-pay | Admitting: Anesthesiology

## 2016-04-29 DIAGNOSIS — O26872 Cervical shortening, second trimester: Secondary | ICD-10-CM

## 2016-04-29 DIAGNOSIS — Z3A22 22 weeks gestation of pregnancy: Secondary | ICD-10-CM

## 2016-04-29 DIAGNOSIS — O24312 Unspecified pre-existing diabetes mellitus in pregnancy, second trimester: Secondary | ICD-10-CM | POA: Diagnosis not present

## 2016-04-29 LAB — GLUCOSE, CAPILLARY
GLUCOSE-CAPILLARY: 163 mg/dL — AB (ref 65–99)
Glucose-Capillary: 137 mg/dL — ABNORMAL HIGH (ref 65–99)
Glucose-Capillary: 149 mg/dL — ABNORMAL HIGH (ref 65–99)
Glucose-Capillary: 220 mg/dL — ABNORMAL HIGH (ref 65–99)

## 2016-04-29 MED ORDER — PROGESTERONE MICRONIZED 200 MG PO CAPS
200.0000 mg | ORAL_CAPSULE | Freq: Every day | ORAL | Status: DC
  Administered 2016-04-29 – 2016-05-01 (×3): 200 mg via VAGINAL
  Filled 2016-04-29 (×4): qty 1

## 2016-04-29 MED ORDER — DIPHENHYDRAMINE HCL 25 MG PO CAPS
25.0000 mg | ORAL_CAPSULE | Freq: Four times a day (QID) | ORAL | Status: DC | PRN
  Administered 2016-04-29: 25 mg via ORAL
  Filled 2016-04-29: qty 1

## 2016-04-29 NOTE — Progress Notes (Signed)
FACULTY PRACTICE ANTEPARTUM(COMPREHENSIVE) NOTE  Deanna Walker is a 35 y.o. G1P0 at 5472w6d by early ultrasound who is admitted for short cervix, incompetent cervix.   Fetal presentation is cephalic. Length of Stay:  1  Days  Subjective:  Patient reports the fetal movement as active. Patient reports uterine contraction  activity as none. Patient reports  vaginal bleeding as none. Patient describes fluid per vagina as Other mucus.  Vitals:  Blood pressure 115/64, pulse 100, temperature 98.7 F (37.1 C), temperature source Oral, resp. rate 16, height 5\' 5"  (1.651 m), weight 89.7 kg (197 lb 12.8 oz), last menstrual period 11/28/2015, SpO2 100 %. Physical Examination:  General appearance - alert, well appearing, and in no distress Heart - normal rate and regular rhythm Abdomen - soft, nontender, nondistended Fundal Height:  size equals dates Cervical Exam: Not evaluated. Extremities: extremities normal, atraumatic, no cyanosis or edema and Homans sign is negative, no sign of DVT bilaterally  Membranes:intact  Fetal Monitoring:     Fetal Heart Rate A  Mode Doppler filed at 04/29/2016 0530  Baseline Rate (A) 153 bpm filed at 04/29/2016 0530     Labs:  Results for orders placed or performed during the hospital encounter of 04/28/16 (from the past 24 hour(s))  Type and screen [facility]   Collection Time: 04/28/16  7:21 PM  Result Value Ref Range   ABO/RH(D) B POS    Antibody Screen NEG    Sample Expiration 05/01/2016   Urinalysis, Routine w reflex microscopic ([facility])   Collection Time: 04/28/16 10:23 PM  Result Value Ref Range   Color, Urine YELLOW YELLOW   APPearance CLEAR CLEAR   Specific Gravity, Urine 1.015 1.005 - 1.030   pH 6.5 5.0 - 8.0   Glucose, UA 250 (A) NEGATIVE mg/dL   Hgb urine dipstick NEGATIVE NEGATIVE   Bilirubin Urine NEGATIVE NEGATIVE   Ketones, ur NEGATIVE NEGATIVE mg/dL   Protein, ur NEGATIVE NEGATIVE mg/dL   Nitrite  NEGATIVE NEGATIVE   Leukocytes, UA SMALL (A) NEGATIVE  Urine microscopic-add on   Collection Time: 04/28/16 10:23 PM  Result Value Ref Range   Squamous Epithelial / LPF 0-5 (A) NONE SEEN   WBC, UA 0-5 0 - 5 WBC/hpf   RBC / HPF 0-5 0 - 5 RBC/hpf   Bacteria, UA RARE (A) NONE SEEN  Glucose, capillary   Collection Time: 04/28/16 10:36 PM  Result Value Ref Range   Glucose-Capillary 145 (H) 65 - 99 mg/dL  Glucose, capillary   Collection Time: 04/29/16  5:19 AM  Result Value Ref Range   Glucose-Capillary 137 (H) 65 - 99 mg/dL     Medications:  Scheduled . betamethasone acetate-betamethasone sodium phosphate  12 mg Intramuscular Q24H  . docusate sodium  100 mg Oral Daily  . prenatal multivitamin  1 tablet Oral Q1200   I have reviewed the patient's current medications.  ASSESSMENT: Patient Active Problem List   Diagnosis Date Noted  . Short cervix in second trimester, antepartum 04/28/2016  . Gestational diabetes 03/12/2016  . Supervision of normal first pregnancy in first trimester 02/12/2016  . Chronic urticaria 09/25/2015  . Perennial and seasonal allergic rhinoconjunctivitis 09/25/2015  . Prediabetes 11/12/2013  . Infertility associated with anovulation 01/24/2013  . PCOS (polycystic ovarian syndrome) 02/11/2011    PLAN: Start Prometrium Reassess cervix per MFM  Jennier Schissler 04/29/2016,7:25 AM

## 2016-04-29 NOTE — Progress Notes (Signed)
Inpatient Diabetes Program Recommendations  Diabetes Treatment Program Recommendations  ADA Standards of Care 2016 Diabetes in Pregnancy Target Glucose Ranges:  Fasting: 60 - 90 mg/dL Preprandial: 60 - 161105 mg/dL 1 hr postprandial: Less than 140mg /dL (from first bite of meal) 2 hr postprandial: Less than 120 mg/dL (from first bite of meal)    Results for Art BuffNDERSON, Benigna M (MRN [id number]) as of 04/29/2016 07:23  Ref. Range 04/28/2016 22:36 04/29/2016 05:19  Glucose-Capillary Latest Ref Range: 65 - 99 mg/dL 811145 (H) 914137 (H)   Review of Glycemic Control  Diabetes history: Prediabetes; GDM Outpatient Diabetes medications: Metformin 1000 mg BID (per home medication list; not taking) Current orders for Inpatient glycemic control: CBGs 2 hour post prandial  Inpatient Diabetes Program Recommendations: Correction (SSI): Please consider ordering fasting glucose. Also, please consider using Diabetic Pregnant Patient order set to order Novolog correction QID (fasting and 2 hour post prandial).  NOTE: In reviewing chart, noted patient has a history of prediabetes and GDM. Patient received one dose of Betamethasone on 04/28/16 and will receive second dose today which is contributing to hyperglycemia.   Thanks, Orlando PennerMarie Jacquelinne Speak, RN, MSN, CDE Diabetes Coordinator Inpatient Diabetes Program [phone] (Team Pager from 8am to 5pm) [phone] (AP office) [phone] [facility] office) [phone] [facility] office)

## 2016-04-30 ENCOUNTER — Encounter: Payer: Medicaid Other | Admitting: Obstetrics and Gynecology

## 2016-04-30 DIAGNOSIS — Z3A22 22 weeks gestation of pregnancy: Secondary | ICD-10-CM | POA: Diagnosis not present

## 2016-04-30 DIAGNOSIS — O3432 Maternal care for cervical incompetence, second trimester: Secondary | ICD-10-CM | POA: Diagnosis present

## 2016-04-30 DIAGNOSIS — O9081 Anemia of the puerperium: Secondary | ICD-10-CM | POA: Diagnosis not present

## 2016-04-30 DIAGNOSIS — O24425 Gestational diabetes mellitus in childbirth, controlled by oral hypoglycemic drugs: Secondary | ICD-10-CM | POA: Diagnosis present

## 2016-04-30 DIAGNOSIS — Z833 Family history of diabetes mellitus: Secondary | ICD-10-CM | POA: Diagnosis not present

## 2016-04-30 DIAGNOSIS — O24312 Unspecified pre-existing diabetes mellitus in pregnancy, second trimester: Secondary | ICD-10-CM | POA: Diagnosis not present

## 2016-04-30 DIAGNOSIS — Z8249 Family history of ischemic heart disease and other diseases of the circulatory system: Secondary | ICD-10-CM | POA: Diagnosis not present

## 2016-04-30 DIAGNOSIS — D649 Anemia, unspecified: Secondary | ICD-10-CM | POA: Diagnosis not present

## 2016-04-30 DIAGNOSIS — O26872 Cervical shortening, second trimester: Secondary | ICD-10-CM | POA: Diagnosis present

## 2016-04-30 LAB — GLUCOSE, CAPILLARY
GLUCOSE-CAPILLARY: 153 mg/dL — AB (ref 65–99)
GLUCOSE-CAPILLARY: 195 mg/dL — AB (ref 65–99)
Glucose-Capillary: 147 mg/dL — ABNORMAL HIGH (ref 65–99)
Glucose-Capillary: 110 mg/dL — ABNORMAL HIGH (ref 65–99)

## 2016-04-30 MED ORDER — INFLUENZA VAC SPLIT QUAD 0.5 ML IM SUSY: 0.5000 mL | PREFILLED_SYRINGE | INTRAMUSCULAR | Status: DC

## 2016-04-30 NOTE — Progress Notes (Signed)
Patient ID: Deanna Walker, female   DOB: 03/12/1981, 35 y.o.   MRN: [id number]  ANTEPARTUM PROGRESS NOTE  Deanna Walker is a 35 y.o. G1P0 at [redacted]w[redacted]d who is admitted for incompetent cervix found incidentally on ultrasound.  Estimated Date of Delivery: 08/27/16 Fetal presentation is cephalic at last ultrasound 04/28/16, but unsure now.  Length of Stay:  1 Days. Admitted 04/28/2016  Subjective: Patient reports fluttering movement, she has never felt daily movement yet.  She reports no uterine contractions, no bleeding and no loss of fluid per vagina. Patient denies feeling anything in the vagina. Patient is comfortable and eating breakfast. Denies F/C, CP/SOB, N/V/D, ambulating normally.  Vitals:  Blood pressure 92/66, pulse (!) 108, temperature 98.5 F (36.9 C), temperature source Oral, resp. rate 18, height 5\' 5"  (1.651 m), weight 195 lb (88.5 kg), last menstrual period 11/28/2015, SpO2 97 %. Physical Examination: CONSTITUTIONAL: Well-developed, well-nourished female in no acute distress.  HEENT:  Normocephalic, atraumatic SKIN: Skin is warm and dry. No rash noted.  NEUROLGIC: Alert and oriented to person, place, and time.  PSYCHIATRIC: Normal mood and affect.  CARDIOVASCULAR: Normal heart rate noted, regular rhythm RESPIRATORY: Effort and breath sounds normal, no problems with respiration noted ABDOMEN: Soft, nontender, nondistended, gravid. CERVIX:   DEFERRED  Fetal monitoring: FHR: 147 bpm, by Doppler  Uterine activity:  None  Results for orders placed or performed during the hospital encounter of 04/28/16 (from the past 48 hour(s))  Type and screen [facility] [place]     Status: None   Collection Time: 04/28/16  7:21 PM  Result Value Ref Range   ABO/RH(D) B POS    Antibody Screen NEG    Sample Expiration 05/01/2016   Urinalysis, Routine w reflex microscopic (not at [facility])     Status: Abnormal   Collection Time: 04/28/16 10:23 PM  Result Value Ref Range   Color, Urine YELLOW YELLOW   APPearance CLEAR CLEAR   Specific Gravity, Urine 1.015 1.005 - 1.030   pH 6.5 5.0 - 8.0   Glucose, UA 250 (A) NEGATIVE mg/dL   Hgb urine dipstick NEGATIVE NEGATIVE   Bilirubin Urine NEGATIVE NEGATIVE   Ketones, ur NEGATIVE NEGATIVE mg/dL   Protein, ur NEGATIVE NEGATIVE mg/dL   Nitrite NEGATIVE NEGATIVE   Leukocytes, UA SMALL (A) NEGATIVE  Urine microscopic-add on     Status: Abnormal   Collection Time: 04/28/16 10:23 PM  Result Value Ref Range   Squamous Epithelial / LPF 0-5 (A) NONE SEEN   WBC, UA 0-5 0 - 5 WBC/hpf   RBC / HPF 0-5 0 - 5 RBC/hpf   Bacteria, UA RARE (A) NONE SEEN  Glucose, capillary     Status: Abnormal   Collection Time: 04/28/16 10:36 PM  Result Value Ref Range   Glucose-Capillary 145 (H) 65 - 99 mg/dL  Glucose, capillary     Status: Abnormal   Collection Time: 04/29/16  5:19 AM  Result Value Ref Range   Glucose-Capillary 137 (H) 65 - 99 mg/dL  Glucose, capillary     Status: Abnormal   Collection Time: 04/29/16  7:56 AM  Result Value Ref Range   Glucose-Capillary 163 (H) 65 - 99 mg/dL  Glucose, capillary     Status: Abnormal   Collection Time: 04/29/16 10:38 AM  Result Value Ref Range   Glucose-Capillary 149 (H) 65 - 99 mg/dL  Glucose, capillary     Status: Abnormal   Collection Time: 04/29/16 10:31 PM  Result Value Ref Range  Glucose-Capillary 220 (H) 65 - 99 mg/dL    [place] Mfm Ob Follow Up  Result Date: 04/28/2016 OBSTETRICAL ULTRASOUND: This exam was performed within a [place] [facility] Department. The OB US report was generated in the AS system, and faxed to the ordering physician.  This report is available in the [employer]. See the AS Obstetric US report via the Image Link.   Current scheduled medications . docusate sodium  100 mg Oral Daily  . [START ON 05/01/2016] Influenza vac split quadrivalent PF  0.5 mL Intramuscular Tomorrow-1000  . prenatal multivitamin  1 tablet Oral Q1200  . progesterone  200 mg  Vaginal Daily    I have reviewed the patient's current medications.  FHT: 147 bpm  04/30/16 at 06:28  ASSESSMENT: Patient Active Problem List   Diagnosis Date Noted  . Short cervix in second trimester, antepartum 04/28/2016  . Gestational diabetes 03/12/2016  . Supervision of normal first pregnancy in first trimester 02/12/2016  . Chronic urticaria 09/25/2015  . Perennial and seasonal allergic rhinoconjunctivitis 09/25/2015  . Prediabetes 11/12/2013  . Infertility associated with anovulation 01/24/2013  . PCOS (polycystic ovarian syndrome) 02/11/2011    PLAN: Will start Magnesium for CP prophylaxis if anything changes and delivery is imminent S/P betamethasone Further plans per MFM Continue routine antenatal care.   Jen MowElizabeth Britzy Graul, DO OB Fellow Faculty Practice, [facility] -

## 2016-04-30 NOTE — Progress Notes (Addendum)
Inpatient Diabetes Program Recommendations  Diabetes Treatment Program Recommendations  ADA Standards of Care 2016 Diabetes in Pregnancy Target Glucose Ranges:  Fasting: 60 - 90 mg/dL Preprandial: 60 - 469105 mg/dL 1 hr postprandial: Less than 140mg /dL (from first bite of meal) 2 hr postprandial: Less than 120 mg/dL (from first bite of meal)   Results for Art BuffNDERSON, Nika M (MRN [id number]) as of 04/30/2016 07:21  Ref. Range 04/28/2016 22:36 04/29/2016 05:19 04/29/2016 07:56 04/29/2016 10:38 04/29/2016 22:31  Glucose-Capillary Latest Ref Range: 65 - 99 mg/dL 244145 (H) 010137 (H) 272163 (H) 149 (H) 220 (H)    Review of Glycemic Control  Diabetes history: Prediabetes; GDM Outpatient Diabetes medications: Metformin 1000 mg BID (per home medication list; not taking) Current orders for Inpatient glycemic control: CBGs 2 hour post prandial  Inpatient Diabetes Program Recommendations: Correction (SSI): Please consider using Diabetic Pregnant Patient order set to order Novolog correction QID (fasting and 2 hour post prandial).  NOTE: In reviewing chart, noted patient has a history of prediabetes and GDM. Patient received one dose of Betamethasone on 04/28/16 and second dose 04/29/16 which is contributing to hyperglycemia.   Thanks, Orlando PennerMarie Huong Luthi, RN, MSN, CDE Diabetes Coordinator Inpatient Diabetes Program [phone] (Team Pager from 8am to 5pm)

## 2016-05-01 ENCOUNTER — Inpatient Hospital Stay (HOSPITAL_COMMUNITY): Payer: Medicaid Other

## 2016-05-01 LAB — GLUCOSE, CAPILLARY
GLUCOSE-CAPILLARY: 133 mg/dL — AB (ref 65–99)
GLUCOSE-CAPILLARY: 108 mg/dL — AB (ref 65–99)
Glucose-Capillary: 138 mg/dL — ABNORMAL HIGH (ref 65–99)
Glucose-Capillary: 93 mg/dL (ref 65–99)

## 2016-05-01 MED ORDER — MAGNESIUM SULFATE 50 % IJ SOLN
2.5000 g/h | INTRAVENOUS | Status: DC
  Administered 2016-05-01: 2 g/h via INTRAVENOUS
  Filled 2016-05-01: qty 80

## 2016-05-01 MED ORDER — MAGNESIUM SULFATE BOLUS VIA INFUSION
6.0000 g | Freq: Once | INTRAVENOUS | Status: AC
  Administered 2016-05-01: 6 g via INTRAVENOUS
  Filled 2016-05-01: qty 500

## 2016-05-01 MED ORDER — LACTATED RINGERS IV SOLN
INTRAVENOUS | Status: DC
  Administered 2016-05-01 – 2016-05-02 (×2): via INTRAVENOUS

## 2016-05-01 MED ORDER — MAGNESIUM SULFATE 50 % IJ SOLN: 2.0000 g/h | INTRAVENOUS | Status: DC

## 2016-05-01 MED ORDER — HYDROMORPHONE HCL 1 MG/ML IJ SOLN
1.0000 mg | INTRAMUSCULAR | Status: DC | PRN
  Administered 2016-05-01: 2 mg via INTRAVENOUS
  Administered 2016-05-01 – 2016-05-02 (×2): 1 mg via INTRAVENOUS
  Administered 2016-05-02 (×2): 2 mg via INTRAVENOUS
  Filled 2016-05-01 (×2): qty 2
  Filled 2016-05-01 (×2): qty 1
  Filled 2016-05-01: qty 2

## 2016-05-01 NOTE — Progress Notes (Signed)
Initial Nutrition Assessment  DOCUMENTATION CODES:   Obesity unspecified  INTERVENTION:  Carbohydrate modified gestational diabetic diet NUTRITION DIAGNOSIS:   Increased nutrient needs related to  (pregnancy and fetal growth requirements) as evidenced by  (23 1/7 weeks IUP).  GOAL:  Patient will meet greater than or equal to 90% of their needs, Weight gain   MONITOR:  Labs, Weight trends  REASON FOR ASSESSMENT:   Antenatal, Gestational Diabetes   ASSESSMENT:   23 1/7 weeks incomp cervix. GDM. pre-preg weight 188 lbs, BMI 31.4. 5 lb weight gain. Reports good appetite. No n/v. Has no queations about GDM diet  Diet Order:  Diet gestational carb mod Room service appropriate? Yes; Fluid consistency: Thin  Skin:  Reviewed, no issues  Last BM:  10/27  Height:   Ht Readings from Last 1 Encounters:  04/28/16 5\' 5"  (1.651 m)    Weight:   Wt Readings from Last 1 Encounters:  05/01/16 193 lb 0.1 oz (87.5 kg)    Ideal Body Weight:  56.8 kg  BMI:  Body mass index is 32.12 kg/m.  Estimated Nutritional Needs:   Kcal:  2000-2200  Protein:  90-100 g  Fluid:  2.3 L  EDUCATION NEEDS:   No education needs identified at this time Education at [facility] 03/16/16  Elisabeth CaraKatherine Archer Moist M.Odis LusterEd. R.D. LDN Neonatal Nutrition Support Specialist/RD III Pager [phone]      Phone [phone]

## 2016-05-01 NOTE — Progress Notes (Signed)
Ultrasound at bedside

## 2016-05-01 NOTE — Progress Notes (Signed)
Dr Constant at bedside. 

## 2016-05-01 NOTE — Progress Notes (Signed)
Toco ordered, waiting on toco from Ante floor

## 2016-05-01 NOTE — Progress Notes (Signed)
Dr. Jolayne Pantheronstant notified of spotting/light bleeding and will come up.

## 2016-05-01 NOTE — Progress Notes (Signed)
Patient moved to AICU for toco monitoring and Mag to be started.

## 2016-05-01 NOTE — Progress Notes (Signed)
Patient ID: Deanna Walker, female   DOB: Nov 23, 1980, 35 y.o.   MRN: [id number] Called to evaluate patient with worsening abdominal pain, requesting pain medication. Patient reports an improvement in the intensity of her cramps but the frequency remains unchanged, every 5 minutes approximately. Patient notes to have vaginal bleeding when using commode. She reports good fetal movement  SVE: 2.5/80/bulging bag  A/P 35 yo G1P0 at 2377w1d admitted with cervical incompetence now with preterm contraction - Magnesium sulfate increased to 2.5 mg/hr - Continue close monitoring - pain management prn

## 2016-05-01 NOTE — Progress Notes (Signed)
No active bleeding at this time. 

## 2016-05-01 NOTE — Progress Notes (Signed)
Patient ID: Deanna Walker, female   DOB: March 10, 1981, 35 y.o.   MRN: [id number]  ANTEPARTUM PROGRESS NOTE  Deanna Walker is a 35 y.o. G1P0 at [redacted]w[redacted]d who is admitted for incompetent cervix found incidentally on ultrasound.  Estimated Date of Delivery: 08/27/16 Fetal presentation is cephalic at last ultrasound 04/28/16, but unsure now.  Length of Stay:  2 Days. Admitted 04/28/2016  Subjective: Patient reports fluttering movement, she has never felt daily movement yet.  She reports severe abdominal cramping following a bowel movement, no bleeding and no loss of fluid per vagina. Patient denies feeling anything in the vagina.   Vitals:  Blood pressure 123/74, pulse 100, temperature 98.4 F (36.9 C), temperature source Oral, resp. rate 18, height 5\' 5"  (1.651 m), weight 87.5 kg (193 lb 0.1 oz), last menstrual period 11/28/2015, SpO2 100 %. Physical Examination: CONSTITUTIONAL: Well-developed, well-nourished female in no acute distress.  HEENT:  Normocephalic, atraumatic SKIN: Skin is warm and dry. No rash noted.  NEUROLGIC: Alert and oriented to person, place, and time.  PSYCHIATRIC: Normal mood and affect.  CARDIOVASCULAR: Normal heart rate noted, regular rhythm RESPIRATORY: Effort and breath sounds normal, no problems with respiration noted ABDOMEN: Soft, nontender, nondistended, gravid. CERVIX:   2.5/80/bulging bag  Fetal monitoring: FHR: 155 bpm, by Doppler    Results for orders placed or performed during the hospital encounter of 04/28/16 (from the past 48 hour(s))  Glucose, capillary     Status: Abnormal   Collection Time: 04/29/16 10:31 PM  Result Value Ref Range   Glucose-Capillary 220 (H) 65 - 99 mg/dL  Glucose, capillary     Status: Abnormal   Collection Time: 04/30/16  7:48 AM  Result Value Ref Range   Glucose-Capillary 153 (H) 65 - 99 mg/dL  Glucose, capillary     Status: Abnormal   Collection Time: 04/30/16 10:08 AM  Result Value Ref Range   Glucose-Capillary 195  (H) 65 - 99 mg/dL  Glucose, capillary     Status: Abnormal   Collection Time: 04/30/16  2:41 PM  Result Value Ref Range   Glucose-Capillary 147 (H) 65 - 99 mg/dL  Glucose, capillary     Status: Abnormal   Collection Time: 04/30/16  9:44 PM  Result Value Ref Range   Glucose-Capillary 110 (H) 65 - 99 mg/dL  Glucose, capillary     Status: Abnormal   Collection Time: 05/01/16 12:14 AM  Result Value Ref Range   Glucose-Capillary 138 (H) 65 - 99 mg/dL  Glucose, capillary     Status: None   Collection Time: 05/01/16  5:16 AM  Result Value Ref Range   Glucose-Capillary 93 65 - 99 mg/dL  Glucose, capillary     Status: Abnormal   Collection Time: 05/01/16 10:24 AM  Result Value Ref Range   Glucose-Capillary 133 (H) 65 - 99 mg/dL  Glucose, capillary     Status: Abnormal   Collection Time: 05/01/16  1:59 PM  Result Value Ref Range   Glucose-Capillary 108 (H) 65 - 99 mg/dL    No results found.  Current scheduled medications . docusate sodium  100 mg Oral Daily  . Influenza vac split quadrivalent PF  0.5 mL Intramuscular Tomorrow-1000  . prenatal multivitamin  1 tablet Oral Q1200  . progesterone  200 mg Vaginal Daily    I have reviewed the patient's current medications.  ASSESSMENT: Patient Active Problem List   Diagnosis Date Noted  . Short cervix in second trimester, antepartum 04/28/2016  . Gestational diabetes 03/12/2016  .  Supervision of normal first pregnancy in first trimester 02/12/2016  . Chronic urticaria 09/25/2015  . Perennial and seasonal allergic rhinoconjunctivitis 09/25/2015  . Prediabetes 11/12/2013  . Infertility associated with anovulation 01/24/2013  . PCOS (polycystic ovarian syndrome) 02/11/2011    PLAN: 35 yo with incompetent cervix now with preterm contractions - Will start Magnesium for CP prophylaxis  - S/P betamethasone - Continue close monitoring - CBGs not significantly elevated s/p BMz. Continue metformin and monitoring Continue routine  antenatal care.   Catalina AntiguaONSTANT,Lanecia Sliva, MD

## 2016-05-01 NOTE — Progress Notes (Signed)
MD notified of scant vaginal bleeding, TOCO received and applied.

## 2016-05-01 NOTE — Progress Notes (Signed)
Entered patient's room to ask if ultrasound had been in yet.  Patient's husband stated "yes, they came about an hour ago and they told me that they would let her rest and that they would come back later".    Ultrasound never requested for this nurse to inform her of such.    This nurse contacted house coverage.   MD notified.

## 2016-05-01 NOTE — Progress Notes (Signed)
I offered support to Deanna Walker and Deanna Walker, her SO.  They are still processing that she is in the hospital at such an early point in the pregnancy.  They asked about some ways to help pass the time because they are trying not to think about the emotional aspects of their waiting.  I returned with some art supplies and crossword puzzles later in the afternoon and found that Deanna Walker is in significant pain.  I alerted the nurse, left the supplies for her, and plan to follow up at a later point.  Please page, however, if there are significant changes or if there is a need for further support.  Chaplain Dyanne CarrelKaty Jibri Walker, Bcc Pager, [phone] 3:22 PM   05/01/16 1500  Clinical Encounter Type  Visited With Patient and family together  Visit Type Spiritual support  Spiritual Encounters  Spiritual Needs Emotional

## 2016-05-02 DIAGNOSIS — O26872 Cervical shortening, second trimester: Secondary | ICD-10-CM

## 2016-05-02 DIAGNOSIS — Z3A22 22 weeks gestation of pregnancy: Secondary | ICD-10-CM

## 2016-05-02 DIAGNOSIS — O24312 Unspecified pre-existing diabetes mellitus in pregnancy, second trimester: Secondary | ICD-10-CM

## 2016-05-02 LAB — CBC
HCT: 27.5 % — ABNORMAL LOW (ref 36.0–46.0)
HEMOGLOBIN: 9.5 g/dL — AB (ref 12.0–15.0)
MCH: 26.6 pg (ref 26.0–34.0)
MCHC: 34.5 g/dL (ref 30.0–36.0)
MCV: 77 fL — ABNORMAL LOW (ref 78.0–100.0)
Platelets: 331 10*3/uL (ref 150–400)
RBC: 3.57 MIL/uL — ABNORMAL LOW (ref 3.87–5.11)
RDW: 14.3 % (ref 11.5–15.5)
WBC: 23.6 10*3/uL — ABNORMAL HIGH (ref 4.0–10.5)

## 2016-05-02 LAB — GLUCOSE, CAPILLARY
GLUCOSE-CAPILLARY: 145 mg/dL — AB (ref 65–99)
GLUCOSE-CAPILLARY: 178 mg/dL — AB (ref 65–99)
GLUCOSE-CAPILLARY: 147 mg/dL — AB (ref 65–99)
GLUCOSE-CAPILLARY: 84 mg/dL (ref 65–99)
Glucose-Capillary: 167 mg/dL — ABNORMAL HIGH (ref 65–99)
Glucose-Capillary: 159 mg/dL — ABNORMAL HIGH (ref 65–99)
Glucose-Capillary: 170 mg/dL — ABNORMAL HIGH (ref 65–99)
Glucose-Capillary: 157 mg/dL — ABNORMAL HIGH (ref 65–99)

## 2016-05-02 LAB — TYPE AND SCREEN
ABO/RH(D): B POS
Antibody Screen: NEGATIVE

## 2016-05-02 MED ORDER — LIDOCAINE HCL (PF) 1 % IJ SOLN
30.0000 mL | INTRAMUSCULAR | Status: DC | PRN
  Filled 2016-05-02: qty 30

## 2016-05-02 MED ORDER — SODIUM CHLORIDE 0.9 % IV SOLN
INTRAVENOUS | Status: DC
  Filled 2016-05-02: qty 2.5

## 2016-05-02 MED ORDER — ONDANSETRON HCL 4 MG/2ML IJ SOLN: 4.0000 mg | INTRAMUSCULAR | Status: DC | PRN

## 2016-05-02 MED ORDER — SOD CITRATE-CITRIC ACID 500-334 MG/5ML PO SOLN: 30.0000 mL | ORAL | Status: DC | PRN

## 2016-05-02 MED ORDER — OXYCODONE-ACETAMINOPHEN 5-325 MG PO TABS: 2.0000 | ORAL_TABLET | ORAL | Status: DC | PRN

## 2016-05-02 MED ORDER — TETANUS-DIPHTH-ACELL PERTUSSIS 5-2.5-18.5 LF-MCG/0.5 IM SUSP
0.5000 mL | Freq: Once | INTRAMUSCULAR | Status: AC
  Administered 2016-05-03: 0.5 mL via INTRAMUSCULAR

## 2016-05-02 MED ORDER — FENTANYL CITRATE (PF) 100 MCG/2ML IJ SOLN
100.0000 ug | INTRAMUSCULAR | Status: DC | PRN
  Administered 2016-05-02 (×2): 100 ug via INTRAVENOUS
  Filled 2016-05-02 (×2): qty 2

## 2016-05-02 MED ORDER — PENICILLIN G POTASSIUM 5000000 UNITS IJ SOLR
2.5000 10*6.[IU] | INTRAMUSCULAR | Status: DC
  Filled 2016-05-02 (×4): qty 2.5

## 2016-05-02 MED ORDER — OXYTOCIN 40 UNITS IN LACTATED RINGERS INFUSION - SIMPLE MED: 2.5000 [IU]/h | INTRAVENOUS | Status: DC

## 2016-05-02 MED ORDER — ZOLPIDEM TARTRATE 5 MG PO TABS
5.0000 mg | ORAL_TABLET | Freq: Every evening | ORAL | Status: DC | PRN
  Administered 2016-05-02 – 2016-05-04 (×2): 5 mg via ORAL
  Filled 2016-05-02 (×2): qty 1

## 2016-05-02 MED ORDER — ACETAMINOPHEN 325 MG PO TABS: 650.0000 mg | ORAL_TABLET | ORAL | Status: DC | PRN

## 2016-05-02 MED ORDER — DEXTROSE 50 % IV SOLN: 25.0000 mL | INTRAVENOUS | Status: DC | PRN

## 2016-05-02 MED ORDER — PRENATAL MULTIVITAMIN CH
1.0000 | ORAL_TABLET | Freq: Every day | ORAL | Status: DC
  Administered 2016-05-03: 1 via ORAL
  Filled 2016-05-02: qty 1

## 2016-05-02 MED ORDER — DIBUCAINE 1 % RE OINT: 1.0000 "application " | TOPICAL_OINTMENT | RECTAL | Status: DC | PRN

## 2016-05-02 MED ORDER — DEXTROSE-NACL 5-0.45 % IV SOLN: INTRAVENOUS | Status: DC

## 2016-05-02 MED ORDER — COCONUT OIL OIL
1.0000 "application " | TOPICAL_OIL | Status: DC | PRN
  Administered 2016-05-03: 1 via TOPICAL
  Filled 2016-05-02: qty 120

## 2016-05-02 MED ORDER — SODIUM CHLORIDE 0.45 % IV SOLN: INTRAVENOUS | Status: DC

## 2016-05-02 MED ORDER — MAGNESIUM SULFATE 50 % IJ SOLN
2.0000 g/h | INTRAVENOUS | Status: DC
  Administered 2016-05-02: 3 g/h via INTRAVENOUS
  Filled 2016-05-02: qty 80

## 2016-05-02 MED ORDER — OXYCODONE-ACETAMINOPHEN 5-325 MG PO TABS: 1.0000 | ORAL_TABLET | ORAL | Status: DC | PRN

## 2016-05-02 MED ORDER — INSULIN REGULAR BOLUS VIA INFUSION
0.0000 [IU] | Freq: Three times a day (TID) | INTRAVENOUS | Status: DC
  Filled 2016-05-02: qty 10

## 2016-05-02 MED ORDER — BENZOCAINE-MENTHOL 20-0.5 % EX AERO: 1.0000 "application " | INHALATION_SPRAY | CUTANEOUS | Status: DC | PRN

## 2016-05-02 MED ORDER — SENNOSIDES-DOCUSATE SODIUM 8.6-50 MG PO TABS
2.0000 | ORAL_TABLET | ORAL | Status: DC
  Administered 2016-05-02: 2 via ORAL
  Filled 2016-05-02 (×2): qty 2

## 2016-05-02 MED ORDER — LIDOCAINE HCL (PF) 1 % IJ SOLN
INTRAMUSCULAR | Status: AC
  Filled 2016-05-02: qty 30

## 2016-05-02 MED ORDER — IBUPROFEN 600 MG PO TABS
600.0000 mg | ORAL_TABLET | Freq: Four times a day (QID) | ORAL | Status: DC
  Administered 2016-05-02 – 2016-05-04 (×7): 600 mg via ORAL
  Filled 2016-05-02 (×7): qty 1

## 2016-05-02 MED ORDER — LACTATED RINGERS IV SOLN: INTRAVENOUS | Status: DC

## 2016-05-02 MED ORDER — OXYTOCIN 40 UNITS IN LACTATED RINGERS INFUSION - SIMPLE MED
INTRAVENOUS | Status: AC
  Filled 2016-05-02: qty 1000

## 2016-05-02 MED ORDER — LACTATED RINGERS IV SOLN: 500.0000 mL | INTRAVENOUS | Status: DC | PRN

## 2016-05-02 MED ORDER — ONDANSETRON HCL 4 MG PO TABS: 4.0000 mg | ORAL_TABLET | ORAL | Status: DC | PRN

## 2016-05-02 MED ORDER — WITCH HAZEL-GLYCERIN EX PADS: 1.0000 "application " | MEDICATED_PAD | CUTANEOUS | Status: DC | PRN

## 2016-05-02 MED ORDER — DEXTROSE 5 % IV SOLN
5.0000 10*6.[IU] | Freq: Once | INTRAVENOUS | Status: AC
  Administered 2016-05-02: 5 10*6.[IU] via INTRAVENOUS
  Filled 2016-05-02: qty 5

## 2016-05-02 MED ORDER — METFORMIN HCL 500 MG PO TABS
1000.0000 mg | ORAL_TABLET | Freq: Two times a day (BID) | ORAL | Status: DC
  Administered 2016-05-02 – 2016-05-04 (×4): 1000 mg via ORAL
  Filled 2016-05-02 (×6): qty 2

## 2016-05-02 MED ORDER — OXYTOCIN BOLUS FROM INFUSION
500.0000 mL | Freq: Once | INTRAVENOUS | Status: AC
  Administered 2016-05-02: 500 mL via INTRAVENOUS

## 2016-05-02 MED ORDER — DIPHENHYDRAMINE HCL 25 MG PO CAPS: 25.0000 mg | ORAL_CAPSULE | Freq: Four times a day (QID) | ORAL | Status: DC | PRN

## 2016-05-02 MED ORDER — SIMETHICONE 80 MG PO CHEW: 80.0000 mg | CHEWABLE_TABLET | ORAL | Status: DC | PRN

## 2016-05-02 MED ORDER — DEXTROSE IN LACTATED RINGERS 5 % IV SOLN
INTRAVENOUS | Status: DC
  Administered 2016-05-02: 11:00:00 via INTRAVENOUS

## 2016-05-02 MED ORDER — ONDANSETRON HCL 4 MG/2ML IJ SOLN: 4.0000 mg | Freq: Four times a day (QID) | INTRAMUSCULAR | Status: DC | PRN

## 2016-05-02 MED ORDER — SODIUM CHLORIDE 0.9 % IV SOLN
INTRAVENOUS | Status: DC
  Administered 2016-05-02: 11:00:00 via INTRAVENOUS
  Filled 2016-05-02: qty 2.5

## 2016-05-02 NOTE — Progress Notes (Signed)
Patients CBG @ 1755 = 157.  Patient had just had meal prior to CBG check.

## 2016-05-02 NOTE — Progress Notes (Signed)
CBG 159. Glucose stabilizer started.

## 2016-05-02 NOTE — Progress Notes (Signed)
CBG 145 at 8:45 and 179 at 9:25. Dr Gerarda Guntherschenck notified.

## 2016-05-02 NOTE — Progress Notes (Signed)
Pt doing ok this AM. Feels as though she needs to urinate, but can't. Pt on Antibiotics, magnesium. Last sugars have been elevated. Started on IV glucose stabilizer. Will continue to monitor closely. At this time plan to have NICU present to asses for

## 2016-05-02 NOTE — Progress Notes (Addendum)
Patient ID: Deanna BuffLetitia M Walker, female   DOB: 12/17/1980, 35 y.o.   MRN: [id number]  ANTEPARTUM PROGRESS NOTE  Deanna Walker is a 35 y.o. G1P0 at 4711w2d who is admitted for incompetent cervix found incidentally on ultrasound.  Estimated Date of Delivery: 08/27/16 Fetal presentation is cephalic at last ultrasound 04/28/16, but unsure now.  Length of Stay:  3 Days. Admitted 04/28/2016  Subjective: Patient reports fluttering movement, she has never felt daily movement yet.  She reports severe abdominal cramping following a bowel movement, no bleeding and no loss of fluid per vagina. Patient denies feeling anything in the vagina.   Vitals:  Blood pressure 116/60, pulse (!) 105, temperature 99 F (37.2 C), temperature source Oral, resp. rate 16, height 5\' 5"  (1.651 m), weight 87.5 kg (193 lb 0.1 oz), last menstrual period 11/28/2015, SpO2 100 %. Physical Examination: CONSTITUTIONAL: Well-developed, well-nourished female in no acute distress.  HEENT:  Normocephalic, atraumatic SKIN: Skin is warm and dry. No rash noted.  NEUROLGIC: Alert and oriented to person, place, and time.  PSYCHIATRIC: Normal mood and affect.  CARDIOVASCULAR: Normal heart rate noted, regular rhythm RESPIRATORY: Effort and breath sounds normal, no problems with respiration noted ABDOMEN: Soft, nontender, nondistended, gravid. CERVIX: 4/100/bulging bag    Fetal monitoring: FHR: 155 bpm, by Doppler  Toco: ctx q 10 minutes   Results for orders placed or performed during the hospital encounter of 04/28/16 (from the past 48 hour(s))  Glucose, capillary     Status: Abnormal   Collection Time: 04/30/16  7:48 AM  Result Value Ref Range   Glucose-Capillary 153 (H) 65 - 99 mg/dL  Glucose, capillary     Status: Abnormal   Collection Time: 04/30/16 10:08 AM  Result Value Ref Range   Glucose-Capillary 195 (H) 65 - 99 mg/dL  Glucose, capillary     Status: Abnormal   Collection Time: 04/30/16  2:41 PM  Result Value Ref Range    Glucose-Capillary 147 (H) 65 - 99 mg/dL  Glucose, capillary     Status: Abnormal   Collection Time: 04/30/16  9:44 PM  Result Value Ref Range   Glucose-Capillary 110 (H) 65 - 99 mg/dL  Glucose, capillary     Status: Abnormal   Collection Time: 05/01/16 12:14 AM  Result Value Ref Range   Glucose-Capillary 138 (H) 65 - 99 mg/dL  Glucose, capillary     Status: None   Collection Time: 05/01/16  5:16 AM  Result Value Ref Range   Glucose-Capillary 93 65 - 99 mg/dL  Glucose, capillary     Status: Abnormal   Collection Time: 05/01/16 10:24 AM  Result Value Ref Range   Glucose-Capillary 133 (H) 65 - 99 mg/dL  Glucose, capillary     Status: Abnormal   Collection Time: 05/01/16  1:59 PM  Result Value Ref Range   Glucose-Capillary 108 (H) 65 - 99 mg/dL  Glucose, capillary     Status: Abnormal   Collection Time: 05/02/16  2:16 AM  Result Value Ref Range   Glucose-Capillary 167 (H) 65 - 99 mg/dL   Comment 1 Notify RN    Comment 2 Document in Chart     [place] Mfm Ob Limited  Result Date: 05/01/2016 OBSTETRICAL ULTRASOUND: This exam was performed within a [facility]. The OB US report was generated in the AS system, and faxed to the ordering physician.  This report is available in the [facility] PACS. See the [facility] Obstetric US report via the Image Link.   Current  scheduled medications . docusate sodium  100 mg Oral Daily  . Influenza vac split quadrivalent PF  0.5 mL Intramuscular Tomorrow-1000  . prenatal multivitamin  1 tablet Oral Q1200  . progesterone  200 mg Vaginal Daily    I have reviewed the patient's current medications.  ASSESSMENT: Patient Active Problem List   Diagnosis Date Noted  . Short cervix in second trimester, antepartum 04/28/2016  . Gestational diabetes 03/12/2016  . Supervision of normal first pregnancy in first trimester 02/12/2016  . Chronic urticaria 09/25/2015  . Perennial and seasonal allergic rhinoconjunctivitis 09/25/2015  . Prediabetes  11/12/2013  . Infertility associated with anovulation 01/24/2013  . PCOS (polycystic ovarian syndrome) 02/11/2011    PLAN: 35 yo with incompetent cervix now with preterm labor s/p BMZ - Continue magnesium sulfate for tocolysis - Transfer to L&D - Continue close monitoring - Continue monitoring CBG   Keveon Amsler, MD

## 2016-05-02 NOTE — Plan of Care (Signed)
Patient and support partner asked for neonatologist to come reevaluate baby's status. Dr. Eric FormWimmer called and en route to see baby.

## 2016-05-03 LAB — CBC
HCT: 24.2 % — ABNORMAL LOW (ref 36.0–46.0)
Hemoglobin: 8.3 g/dL — ABNORMAL LOW (ref 12.0–15.0)
MCH: 26.8 pg (ref 26.0–34.0)
MCHC: 34.3 g/dL (ref 30.0–36.0)
MCV: 78.1 fL (ref 78.0–100.0)
Platelets: 319 10*3/uL (ref 150–400)
RBC: 3.1 MIL/uL — ABNORMAL LOW (ref 3.87–5.11)
RDW: 14.6 % (ref 11.5–15.5)
WBC: 20.1 10*3/uL — ABNORMAL HIGH (ref 4.0–10.5)

## 2016-05-03 LAB — GLUCOSE, CAPILLARY
GLUCOSE-CAPILLARY: 79 mg/dL (ref 65–99)
GLUCOSE-CAPILLARY: 88 mg/dL (ref 65–99)
GLUCOSE-CAPILLARY: 71 mg/dL (ref 65–99)

## 2016-05-03 LAB — RPR: RPR Ser Ql: NONREACTIVE

## 2016-05-03 MED ORDER — INFLUENZA VAC SPLIT QUAD 0.5 ML IM SUSY
0.5000 mL | PREFILLED_SYRINGE | INTRAMUSCULAR | Status: AC
  Administered 2016-05-04: 0.5 mL via INTRAMUSCULAR
  Filled 2016-05-03: qty 0.5

## 2016-05-03 NOTE — Lactation Note (Signed)
This note was copied from a baby's chart. Lactation Consultation Note  Patient Name: Deanna Levada SchillingLetitia Walker ZOXWR'UToday's Date: 05/03/2016 Reason for consult: Initial assessment;NICU baby;Infant < 6lbs;Other (Comment) (infant 22 3/7 weeks, weight less than 1 lb -)  With this mom of a 23 2/[redacted] week gestation baby, in NICU, birth weight under 1 lb. Mom has begun pumping, and I decreased her to 21 flanges for now. She has very flat nipples, that evert some with stimulation. I showed mom how to set initiation setting, and advised her to try and pump every 3 hours, and to try and wee baby prior to pumping. Mom has a tiny diaper and dress the baby wore after birth, worn when the parents got to hold her. I suggested this outfit would be great for mom to have near her when pumping. I was not able to express any colostrum, but explained to parents that this is normal, and she may not see any colostrum for 2-3 days. NICU booklet reviewed with parents, and mom shown how to hand express. WIC fax sent for mom to get a dEP at discharge. Mom knows to call for questions/conerns.    Maternal Data Formula Feeding for Exclusion: Yes (baby in NICU) Has patient been taught Hand Expression?: Yes Does the patient have breastfeeding experience prior to this delivery?: No  Feeding    LATCH Score/Interventions                      Lactation Tools Discussed/Used WIC Program: Yes (fax to be sent for DEP) Pump Review: Setup, frequency, and cleaning;Milk Storage;Other (comment) (hand expression, pump use and settings, review of NICU booklet) Initiated by:: bedside RN Date initiated:: 05/03/16   Consult Status Consult Status: Follow-up Date: 05/04/16 Follow-up type: In-patient    Alfred LevinsLee, [name] 05/03/2016, 3:10 PM

## 2016-05-03 NOTE — Plan of Care (Signed)
Problem: Education: Goal: Knowledge of [facility] Education information/materials will improve Outcome: Completed/Met Date Met: 05/03/16 Patient made aware of plan of care,and oriented to room and routine.

## 2016-05-03 NOTE — Progress Notes (Signed)
Post Partum Day 1 Subjective: no complaints, up ad lib, voiding and tolerating PO  Objective: Blood pressure (!) 101/52, pulse 83, temperature 97.5 F (36.4 C), temperature source Oral, resp. rate 16, height 5\' 5"  (1.651 m), weight 193 lb 0.1 oz (87.5 kg), last menstrual period 11/28/2015, SpO2 100 %.  Physical Exam:  General: alert, cooperative, appears stated age and no distress Lochia: appropriate Uterine Fundus: firm Incision: n/a DVT Evaluation: No evidence of DVT seen on physical exam.   Recent Labs  05/02/16 1139 05/03/16 0507  HGB 9.5* 8.3*  HCT 27.5* 24.2*    Assessment/Plan: Plan for discharge tomorrow   LOS: 4 days   Deanna Walker 05/03/2016, 9:06 AM

## 2016-05-03 NOTE — Plan of Care (Signed)
Problem: Education: Goal: Knowledge of the prescribed therapeutic regimen will improve Outcome: Completed/Met Date Met: 05/03/16 Patient made aware of plan of care.  Problem: Coping: Goal: Level of anxiety will decrease Outcome: Completed/Met Date Met: 05/03/16 Patient has good family support and is very optimistic about her baby;s chances. Goal: Ability to identify and develop effective coping behavior will improve Outcome: Completed/Met Date Met: 05/03/16 Has good family support and is aware of resources if she needs them. Goal: Demonstration of participation in decision-making regarding own care will improve Outcome: Completed/Met Date Met: 05/03/16 Patient is fully participating in her care and asks appropriate questions about her self care.

## 2016-05-03 NOTE — Clinical Social Work Maternal (Signed)
  CLINICAL SOCIAL WORK MATERNAL/CHILD NOTE  Patient Details  Name: Deanna Walker MRN: [id number] Date of Birth: [birth year]  Date:  05/03/2016  Clinical Social Worker Initiating Note:  Leonard Downing Morgann Woodburn, MSW, LCSW-A  Date/ Time Initiated:  05/03/16/1708     Child's Name:  Deanna Walker   Legal Guardian:  Other (Comment) (Not established by court system; MOB and FOB parent collectively )   Need for Interpreter:  None   Date of Referral:  05/02/16     Reason for Referral:  Other (Comment) (New NICU admission )   Referral Source:  NICU   Address:  [place],  [address]  Phone number:  [phone]   Household Members:  Self, Significant Other   Natural Supports (not living in the home):  Immediate Family, Extended Family, Friends   Chiropodist: None   Employment: Unemployed   Type of Work: Unemployed   Education:  9 to 11 years   Museum/gallery curator Resources:  Medicaid   Other Resources:  [facility]   Cultural/Religious Considerations Which May Impact Care:  None reported at this time.   Strengths:  Ability to meet basic needs , Compliance with medical plan    Risk Factors/Current Problems:  None   Cognitive State:  Alert , Able to Concentrate , Insightful , Goal Oriented    Mood/Affect:  Calm , Comfortable , Interested    CSW Assessment: CSW met with MOB at bedside. At this time, MOB was accompanied by FOB. MOB and FOB were warm and inviting. This Probation officer explained tole and reasoning for visit being to assess needs and offer support as it is warranted with new NICU admission. This Probation officer empathized with the family as she understands having a baby prematurely can be difficult and scary. MOB and FOB both agreed and stated they're happy that she is doing better today. Both parents note although she came early, they're well prepared at home for her arrival. MOB notes transportation will not be an issue during baby's time in the NICU. She noted she has a car to  get to and from and has a lot of support. FOB noted he is on leave from work right now and he has made them aware of the early delivery so they are being flexible with him.   This Probation officer discussed SSI with MOB and FOB and explored there interest. MOB notes she will look into it more and make a decision at a late time. This Probation officer informed MOB that that is perfectly fine and CSW is available anytime she has questions or concerns throughout this time. At this time, no other needs were addressed or requested. CSW is available should any needs arise.   CSW Plan/Description:  Psychosocial Support and Ongoing Assessment of Needs   Water quality scientist, MSW, LCSW-A Clinical Social Worker  [facility]  Office: [phone]

## 2016-05-04 ENCOUNTER — Encounter (HOSPITAL_COMMUNITY): Payer: Self-pay

## 2016-05-04 LAB — GLUCOSE, CAPILLARY: Glucose-Capillary: 77 mg/dL (ref 65–99)

## 2016-05-04 MED ORDER — IBUPROFEN 600 MG PO TABS: 600.0000 mg | ORAL_TABLET | Freq: Four times a day (QID) | ORAL | 2 refills | Status: DC

## 2016-05-04 MED ORDER — SENNOSIDES-DOCUSATE SODIUM 8.6-50 MG PO TABS: 2.0000 | ORAL_TABLET | ORAL | 2 refills | Status: DC

## 2016-05-04 MED ORDER — ACETAMINOPHEN-CODEINE #3 300-30 MG PO TABS: 1.0000 | ORAL_TABLET | ORAL | 0 refills | Status: DC | PRN

## 2016-05-04 MED ORDER — IRON POLYSACCH CMPLX-B12-FA 150-0.025-1 MG PO CAPS: 1.0000 | ORAL_CAPSULE | Freq: Two times a day (BID) | ORAL | 4 refills | Status: DC

## 2016-05-04 MED ORDER — COCONUT OIL OIL: 1.0000 "application " | TOPICAL_OIL | 99 refills | Status: DC | PRN

## 2016-05-04 NOTE — Discharge Summary (Signed)
Obstetric Discharge Summary Reason for Admission: Preterm Labor with incompetent cervix, BMZ series and Mag given prior to delivery.  Delivery at 23.2 EGA. Prenatal Procedures: ultrasound Intrapartum Procedures: spontaneous vaginal delivery Postpartum Procedures: none Complications-Operative and Postpartum: none Hemoglobin  Date Value Ref Range Status  05/03/2016 8.3 (L) 12.0 - 15.0 g/dL Final   HCT  Date Value Ref Range Status  05/03/2016 24.2 (L) 36.0 - 46.0 % Final   Hematocrit  Date Value Ref Range Status  03/11/2016 34.5 34.0 - 46.6 % Final    Physical Exam:  General: alert, cooperative and no distress Lochia: appropriate Uterine Fundus: firm Incision: none DVT Evaluation: No evidence of DVT seen on physical exam. No cords or calf tenderness. No significant calf/ankle edema.  Discharge Diagnoses: Incompetent cervix, Premature labor and Preterm Delivery @23 .2 wks, Anemia.   Discharge Information: Date: 05/04/2016 Activity: pelvic rest Diet: routine Medications: PNV, Tylenol #3, Ibuprofen, Colace and Iron Condition: stable Instructions: refer to practice specific booklet Discharge to: home Follow-up Information    Rachelle A Denney, CNM Follow up in 4 week(s).   Specialty:  Certified Nurse Midwife Contact information: [address] [place] [address] [phone]           Newborn Data: Live born female  Birth Weight: 15.9 oz (450 g) APGAR: ,   Home with mother.  Roe Coombsachelle A Denney, CNM 05/04/2016, 9:21 AM

## 2016-05-04 NOTE — Lactation Note (Signed)
This note was copied from a baby's chart. Lactation Consultation Note  Patient Name: Deanna Levada SchillingLetitia Tignor [id number]'XToday's Date: 05/04/2016 Reason for consult: Initial assessment;NICU baby Follow up visit made.  Mom is pumping every 3 hours but not obtaining colostrum.  Reassured and discussed milk coming to volume.  Encouraged to continue to pump 8-12 times in 24 hours.  [facility].  Will follow up. Maternal Data    Feeding    LATCH Score/Interventions                      Lactation Tools Discussed/Used WIC Program: Yes Pump Review: Setup, frequency, and cleaning;Milk Storage Initiated by:: RN Date initiated:: 05/02/16   Consult Status Consult Status: PRN    Huston FoleyMOULDEN, Coti Burd S 05/04/2016, 11:38 AM

## 2016-05-04 NOTE — Progress Notes (Signed)
Patient has been discharged and is visiting in NICU and waiting on breast pump information .

## 2016-05-04 NOTE — Progress Notes (Signed)
Lactation is coming to see patient before she discharges.

## 2016-05-04 NOTE — Progress Notes (Signed)
Post Partum Day #2 Subjective: no complaints, up ad lib, voiding and tolerating PO. Is pumping.  Denies any vertigo or HA.   Objective: Blood pressure (!) 107/49, pulse 86, temperature 98.7 F (37.1 C), temperature source Oral, resp. rate 16, height 5\' 5"  (1.651 m), weight 193 lb 0.1 oz (87.5 kg), last menstrual period 11/28/2015, SpO2 100 %, unknown if currently breastfeeding.  Physical Exam:  General: alert, cooperative and no distress Lochia: appropriate Uterine Fundus: firm Incision: none DVT Evaluation: No evidence of DVT seen on physical exam. No cords or calf tenderness. No significant calf/ankle edema.   Recent Labs  05/02/16 1139 05/03/16 0507  HGB 9.5* 8.3*  HCT 27.5* 24.2*    Assessment/Plan: Discharge home, Breastfeeding and Contraception POP planned Anemia: stable   LOS: 5 days   Roe Coombsachelle A Denney, CNM 05/04/2016, 9:15 AM

## 2016-05-05 ENCOUNTER — Ambulatory Visit: Payer: Self-pay

## 2016-05-05 NOTE — Lactation Note (Signed)
This note was copied from a baby's chart. Lactation Consultation Note  Patient Name: Deanna Walker [id number] Date: 05/05/2016  Met with mom in the NICU.  She is concerned her breasts feel tight this AM.  She states she did not pump during the night.  Both breasts full and tender but soft to palpation.  No engorgement noted.  Assisted mom with pumping using the symphony pump.  Breasts massaged during pumping.  Mom obtained 20 mls from left breast and a few mls from right.  Reassured mom the fullness and tenderness is normal.  Ice packs given for comfort.  Instructed to pump every 2 hours today and pump until milk is dripping.  Mom is very pleased her milk is coming in.   Maternal Data    Feeding    LATCH Score/Interventions                      Lactation Tools Discussed/Used     Consult Status      Ave Filter 05/05/2016, 12:42 PM

## 2016-05-05 NOTE — Lactation Note (Signed)
This note was copied from a baby's chart. Lactation Consultation Note  Patient Name: Deanna Levada SchillingLetitia Harlan BMWUX'LToday's Date: 05/05/2016  Assisted with pumping again.  FOB shown how to assist with breast massage and compression during pumping to assist with milk flow.  Both breasts are flowing.  Mom has been using ice to breasts between feeds.  Reminded mom to relax during pumping.  Mom somewhat anxious about how her breasts feel.  Breasts remain soft but full.  Encouragement and reassurance given.   Maternal Data    Feeding    LATCH Score/Interventions                      Lactation Tools Discussed/Used     Consult Status      Huston FoleyMOULDEN, Tikisha Molinaro S 05/05/2016, 3:15 PM

## 2016-05-06 ENCOUNTER — Encounter (HOSPITAL_COMMUNITY): Payer: Self-pay | Admitting: *Deleted

## 2016-05-26 ENCOUNTER — Ambulatory Visit (HOSPITAL_COMMUNITY): Payer: Medicaid Other

## 2016-06-04 ENCOUNTER — Ambulatory Visit (INDEPENDENT_AMBULATORY_CARE_PROVIDER_SITE_OTHER): Payer: Medicaid Other | Admitting: Certified Nurse Midwife

## 2016-06-04 ENCOUNTER — Encounter: Payer: Self-pay | Admitting: Obstetrics and Gynecology

## 2016-06-04 DIAGNOSIS — O364XX0 Maternal care for intrauterine death, not applicable or unspecified: Secondary | ICD-10-CM | POA: Diagnosis not present

## 2016-06-04 NOTE — Progress Notes (Signed)
..  Post Partum Exam  Deanna Walker is a 35 y.o. [id number] female who presents for a postpartum visit. She is 6 weeks postpartum following a spontaneous vaginal delivery. I have fully reviewed the prenatal and intrapartum course. The delivery was at 23.2 gestational weeks.  Anesthesia: epidural. Postpartum course has been normal. Baby's course has been complicated by a demise. Bleeding no bleeding. Bowel function is normal. Bladder function is normal. Patient is not sexually active. Contraception method is abstinence. Postpartum depression screening:neg  The following portions of the patient's history were reviewed and updated as appropriate: allergies, current medications, past family history, past medical history, past social history, past surgical history and problem list.  Review of Systems Pertinent items noted in HPI and remainder of comprehensive ROS otherwise negative.    Objective:    BP 116/78 mmHg  Pulse 78  Resp 16  Ht 5\' 5"  (1.651 m)  Wt 211 lb (95.709 kg)  BMI 35.11 kg/m2  Breastfeeding? Yes  General:  alert, cooperative and no distress   Breasts:  inspection negative, no nipple discharge or bleeding, no masses or nodularity palpable  Lungs: clear to auscultation bilaterally  Heart:  regular rate and rhythm, S1, S2 normal, no murmur, click, rub or gallop  Abdomen: soft, non-tender; bowel sounds normal; no masses,  no organomegaly   Vulva:  normal  Vagina: normal vagina  Cervix:  no cervical motion tenderness  Corpus: normal size, contour, position, consistency, mobility, non-tender  Adnexa:  normal adnexa  Rectal Exam: Not performed.        Assessment:    Normal 6 week postpartum exam. Pap smear not done at today's visit.   Plan:   1. Contraception: none 2. Due for annual exam after February 13, 2017.  [facility] number given to the patient for f/u.   3. Follow up in: 6 months or as needed.

## 2016-06-08 ENCOUNTER — Other Ambulatory Visit: Payer: Medicaid Other

## 2016-06-15 ENCOUNTER — Ambulatory Visit: Payer: Medicaid Other | Admitting: Obstetrics and Gynecology

## 2016-06-19 ENCOUNTER — Other Ambulatory Visit: Payer: Medicaid Other

## 2016-06-20 ENCOUNTER — Other Ambulatory Visit: Payer: Self-pay | Admitting: Certified Nurse Midwife

## 2016-06-20 DIAGNOSIS — Z3169 Encounter for other general counseling and advice on procreation: Secondary | ICD-10-CM

## 2016-06-23 ENCOUNTER — Ambulatory Visit (HOSPITAL_COMMUNITY): Payer: Medicaid Other

## 2016-07-10 ENCOUNTER — Other Ambulatory Visit: Payer: Medicaid Other

## 2016-07-16 ENCOUNTER — Other Ambulatory Visit: Payer: Medicaid Other

## 2016-07-22 ENCOUNTER — Ambulatory Visit: Payer: Medicaid Other | Admitting: Certified Nurse Midwife

## 2016-07-31 ENCOUNTER — Other Ambulatory Visit: Payer: Medicaid Other

## 2016-07-31 ENCOUNTER — Ambulatory Visit: Payer: Medicaid Other | Admitting: Certified Nurse Midwife

## 2016-08-05 ENCOUNTER — Telehealth: Payer: Self-pay | Admitting: *Deleted

## 2016-08-05 NOTE — Telephone Encounter (Signed)
Pt called to office stating she had some questions.  Ask for return call.  Attempt to contact pt.  LM on VM to call back.

## 2016-08-10 NOTE — Telephone Encounter (Signed)
TC to pt. Pt questioned does she need to continue Metformin. Informed pt to stay on Metformin until the provider tell her to stop.

## 2016-09-23 ENCOUNTER — Other Ambulatory Visit: Payer: Self-pay | Admitting: Certified Nurse Midwife

## 2016-09-23 ENCOUNTER — Telehealth: Payer: Self-pay | Admitting: *Deleted

## 2016-09-23 DIAGNOSIS — E282 Polycystic ovarian syndrome: Secondary | ICD-10-CM

## 2016-09-23 DIAGNOSIS — Z3169 Encounter for other general counseling and advice on procreation: Secondary | ICD-10-CM

## 2016-09-23 NOTE — Telephone Encounter (Signed)
Please bring in for an infertility appointment.

## 2016-09-23 NOTE — Telephone Encounter (Signed)
Patient called- it has been almost 6 months since her demise and she is ready to try again for pregnancy. She has returned to regular cycles and would like to restart Femara if that is ok with Rachelle. She was given that by Dr Elesa Hackereaton after her failed Clomid and it worked the first try. She wants to make sure she doesn't need a check up with Boykin Reaperachelle first- she will come in if needed.

## 2016-09-24 NOTE — Telephone Encounter (Signed)
Patient notified she will need to be seen- she will be expecting a call to schedule a consult appointment with Rachelle.

## 2016-10-07 ENCOUNTER — Encounter: Payer: Self-pay | Admitting: Certified Nurse Midwife

## 2016-10-07 ENCOUNTER — Ambulatory Visit (INDEPENDENT_AMBULATORY_CARE_PROVIDER_SITE_OTHER): Payer: Medicaid Other | Admitting: Certified Nurse Midwife

## 2016-10-07 VITALS — BP 150/89 | HR 82 | Wt 198.2 lb

## 2016-10-07 DIAGNOSIS — L0232 Furuncle of buttock: Secondary | ICD-10-CM

## 2016-10-07 DIAGNOSIS — L02429 Furuncle of limb, unspecified: Secondary | ICD-10-CM | POA: Insufficient documentation

## 2016-10-07 DIAGNOSIS — Z3169 Encounter for other general counseling and advice on procreation: Secondary | ICD-10-CM

## 2016-10-07 DIAGNOSIS — E282 Polycystic ovarian syndrome: Secondary | ICD-10-CM

## 2016-10-07 DIAGNOSIS — L0292 Furuncle, unspecified: Secondary | ICD-10-CM | POA: Insufficient documentation

## 2016-10-07 MED ORDER — CITRANATAL BLOOM 90-1 MG PO TABS: 1.0000 | ORAL_TABLET | Freq: Every day | ORAL | 12 refills | Status: DC

## 2016-10-07 MED ORDER — LETROZOLE 2.5 MG PO TABS: 2.5000 mg | ORAL_TABLET | Freq: Every day | ORAL | 2 refills | Status: DC

## 2016-10-07 MED ORDER — PRENATE PIXIE 10-0.6-0.4-200 MG PO CAPS: 1.0000 | ORAL_CAPSULE | Freq: Every day | ORAL | 12 refills | Status: DC

## 2016-10-07 NOTE — Progress Notes (Signed)
Patient ID: Deanna Walker, female   DOB: April 09, 1981, 36 y.o.   MRN: [id number]  Chief Complaint  Patient presents with  . Advice Only    Patient is in the office to discuss her PCOS, cycle control, continuation of medications and preconception.    HPI Deanna Walker is a 36 y.o. female.  Here for f/u on PCOS.  Hx of recent IUFD at 23 weeks 04/2016.  Noted to have elevated blood pressures today.  No hx of elevated blood pressure.  Does desire pregnancy in the future.  Reports regular menses with increased flow compared to prior to pregnancy on days 3-4.  Is not currently taking iron, hx of anemia.  Iron and PNV ordered for future fertility.  Blood pressure check next week.  Encouraged to f/u with PCP for blood pressure.  F/U in 3 months if not pregnant.  Weight loss encouraged: states that she would like to try phentermine, discussed good blood pressure control prior to starting.  Would consider doing progesterone levels at a later date if starting on Femara.     HPI  Past Medical History:  Diagnosis Date  . Abnormal Pap smear 2011   colpo and biopsy done, normal paps since  . Boil   . Boil of buttock   . Boil, axilla   . Depression   . Diabetes mellitus    pre diabetes- using metformin to bring numbers down  . Gestational diabetes   . Infertility associated with anovulation 01/24/2013  . Obesity   . PCOS (polycystic ovarian syndrome)   . Perennial and seasonal allergic rhinoconjunctivitis 09/25/2015  . Short cervix in second trimester, antepartum 04/28/2016    Past Surgical History:  Procedure Laterality Date  . ABCESS DRAINAGE    . COLPOSCOPY      Family History  Problem Relation Age of Onset  . Diabetes Maternal Aunt   . Hypertension Maternal Aunt   . Seizures Mother   . Allergic rhinitis Neg Hx   . Angioedema Neg Hx   . Asthma Neg Hx   . Atopy Neg Hx   . Eczema Neg Hx   . Immunodeficiency Neg Hx   . Urticaria Neg Hx   . Cystic fibrosis Neg Hx   . Lupus Neg  Hx   . Emphysema Neg Hx   . Migraines Neg Hx     Social History Social History  Substance Use Topics  . Smoking status: Never Smoker  . Smokeless tobacco: Never Used  . Alcohol use No     Comment: occasionally     No Known Allergies  Current Outpatient Prescriptions  Medication Sig Dispense Refill  . letrozole (FEMARA) 2.5 MG tablet Take 1 tablet (2.5 mg total) by mouth daily. Starting on day #3 and continuing until day number 7. 5 tablet 2  . Prenat-FeAsp-Meth-FA-DHA w/o A (PRENATE PIXIE) 10-0.6-0.4-200 MG CAPS Take 1 tablet by mouth daily. 30 capsule 12  . Prenat-FeFmCb-DSS-FA-DHA w/o A (CITRANATAL HARMONY) 27-1-260 MG CAPS take 1 capsule by mouth once daily (Patient not taking: Reported on 10/07/2016) 30 capsule 12  . Prenatal-DSS-FeCb-FeGl-FA (CITRANATAL BLOOM) 90-1 MG TABS Take 1 tablet by mouth daily. 30 tablet 12   No current facility-administered medications for this visit.     Review of Systems Review of Systems Constitutional: negative for fatigue and weight loss Respiratory: negative for cough and wheezing Cardiovascular: negative for chest pain, fatigue and palpitations Gastrointestinal: negative for abdominal pain and change in bowel habits Genitourinary:negative Integument/breast: negative for nipple discharge  Musculoskeletal:negative for myalgias Neurological: negative for gait problems and tremors Behavioral/Psych: negative for abusive relationship, depression Endocrine: negative for temperature intolerance      Blood pressure (!) 150/89, pulse 82, weight 198 lb 3.2 oz (89.9 kg), last menstrual period 09/11/2016, not currently breastfeeding.  Physical Exam Physical Exam General:   alert  Skin:   no rash or abnormalities  Lungs:   clear to auscultation bilaterally  Heart:   regular rate and rhythm, S1, S2 normal, no murmur, click, rub or gallop  Breasts:   deferred  Abdomen:  normal findings: no organomegaly, soft, non-tender and no hernia  Pelvis:   deferred    50% of 30 min visit spent on counseling and coordination of care.    Data Reviewed Previous medical hx, meds, labs  Assessment     PCOS Elevated blood pressure Obesity AUB with regular menses    Plan    Orders Placed This Encounter  Procedures  . Hemoglobin A1c  . CBC   Meds ordered this encounter  Medications  . Prenat-FeAsp-Meth-FA-DHA w/o A (PRENATE PIXIE) 10-0.6-0.4-200 MG CAPS    Sig: Take 1 tablet by mouth daily.    Dispense:  30 capsule    Refill:  12    Please process coupon: Rx BIN: [id number], RxPCN: [id number], RxGRP: [id number], RxID: [id number]  SUF: 01  . Prenatal-DSS-FeCb-FeGl-FA (CITRANATAL BLOOM) 90-1 MG TABS    Sig: Take 1 tablet by mouth daily.    Dispense:  30 tablet    Refill:  12  . letrozole (FEMARA) 2.5 MG tablet    Sig: Take 1 tablet (2.5 mg total) by mouth daily. Starting on day #3 and continuing until day number 7.    Dispense:  5 tablet    Refill:  2     Possible management options include:  Phentermine for weight loss, blood pressure medications Follow up in 3 months or earlier.

## 2016-10-08 ENCOUNTER — Other Ambulatory Visit: Payer: Self-pay | Admitting: Certified Nurse Midwife

## 2016-10-08 DIAGNOSIS — E282 Polycystic ovarian syndrome: Secondary | ICD-10-CM

## 2016-10-08 LAB — CBC
HEMOGLOBIN: 13.2 g/dL (ref 11.1–15.9)
Hematocrit: 41.1 % (ref 34.0–46.6)
MCH: 25.4 pg — ABNORMAL LOW (ref 26.6–33.0)
MCHC: 32.1 g/dL (ref 31.5–35.7)
MCV: 79 fL (ref 79–97)
Platelets: 345 10*3/uL (ref 150–379)
RBC: 5.2 x10E6/uL (ref 3.77–5.28)
RDW: 16.1 % — ABNORMAL HIGH (ref 12.3–15.4)
WBC: 10.4 10*3/uL (ref 3.4–10.8)

## 2016-10-08 LAB — HEMOGLOBIN A1C
ESTIMATED AVERAGE GLUCOSE: 143 mg/dL
HEMOGLOBIN A1C: 6.6 % — AB (ref 4.8–5.6)

## 2016-10-08 MED ORDER — METFORMIN HCL 500 MG PO TABS: 500.0000 mg | ORAL_TABLET | Freq: Two times a day (BID) | ORAL | 12 refills | Status: AC

## 2016-10-13 ENCOUNTER — Ambulatory Visit: Payer: Medicaid Other

## 2016-10-13 VITALS — BP 138/82

## 2016-10-13 DIAGNOSIS — R030 Elevated blood-pressure reading, without diagnosis of hypertension: Secondary | ICD-10-CM

## 2016-10-13 NOTE — Progress Notes (Signed)
Nurse visit for BP check. BP check WNL reviewed with provider in office Dr. Alysia Penna.

## 2016-11-05 ENCOUNTER — Other Ambulatory Visit: Payer: Self-pay | Admitting: Allergy and Immunology

## 2016-11-05 NOTE — Telephone Encounter (Signed)
I denied refill for Zyrtec. Patient was last seen 09/25/15,was asked to follow up in 4 weeks. Patient needs office visit for further refills.

## 2016-12-01 ENCOUNTER — Other Ambulatory Visit: Payer: Self-pay | Admitting: Allergy and Immunology

## 2017-03-04 IMAGING — US US MFM OB FOLLOW-UP
1 series · 13 of 28 positions shown · non-contrast
Comparison: none

[Series 1: us mfm ob follow-up · 13 of 51 slices shown]
[im 2/51]
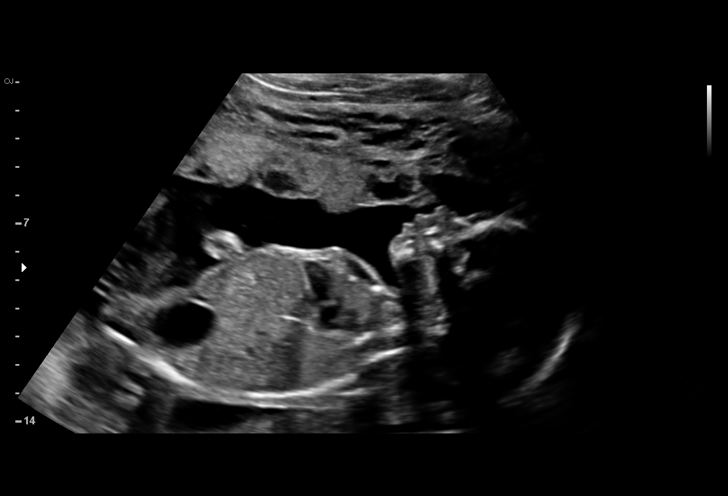
[im 6/51]
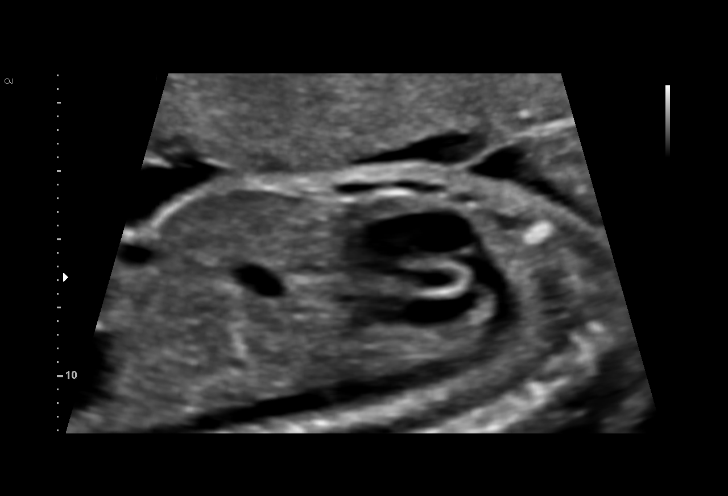
[im 10/51]
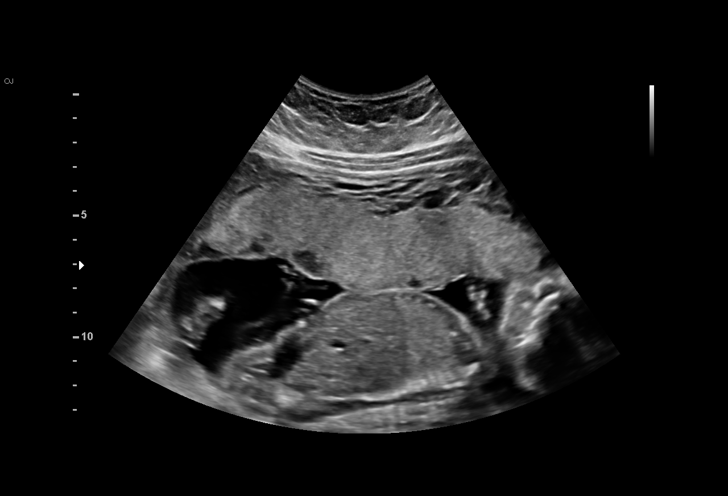
[im 13/51]
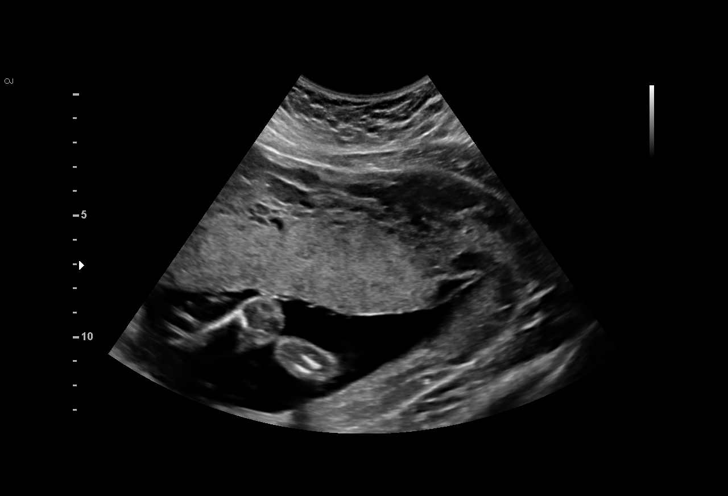
[im 17/51]
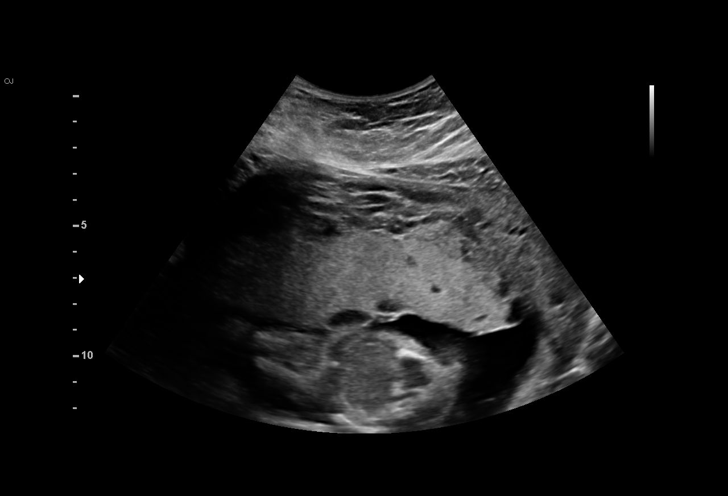
[im 21/51]
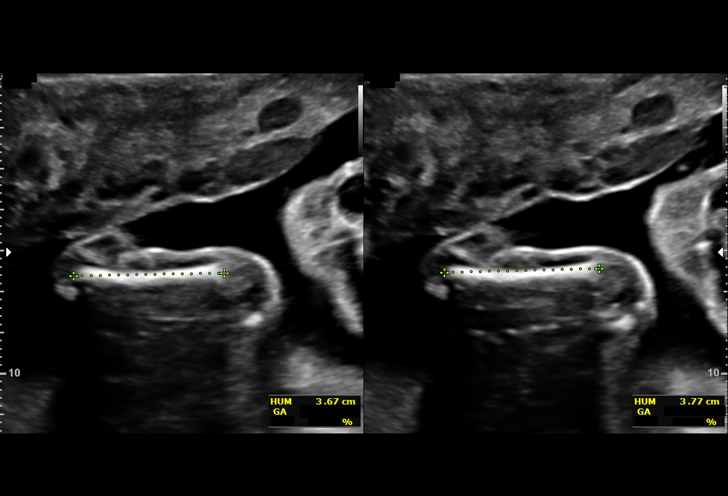
[im 26/51]
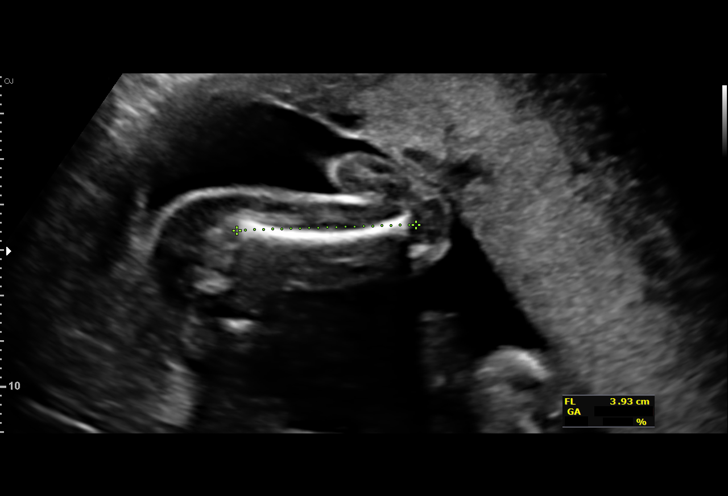
[im 30/51]
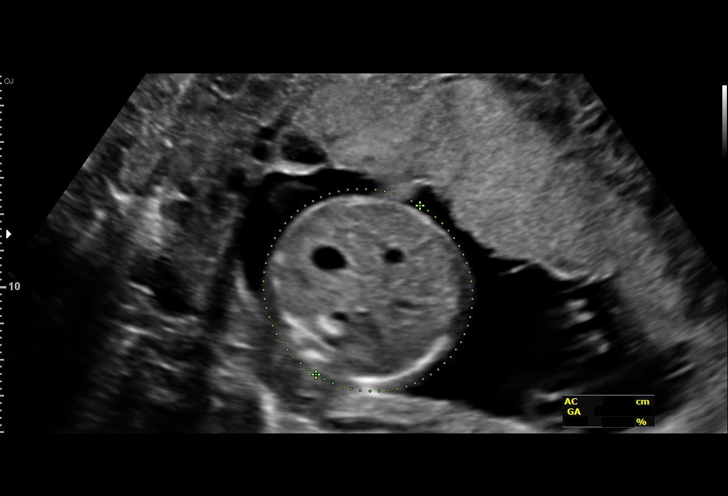
[im 34/51]
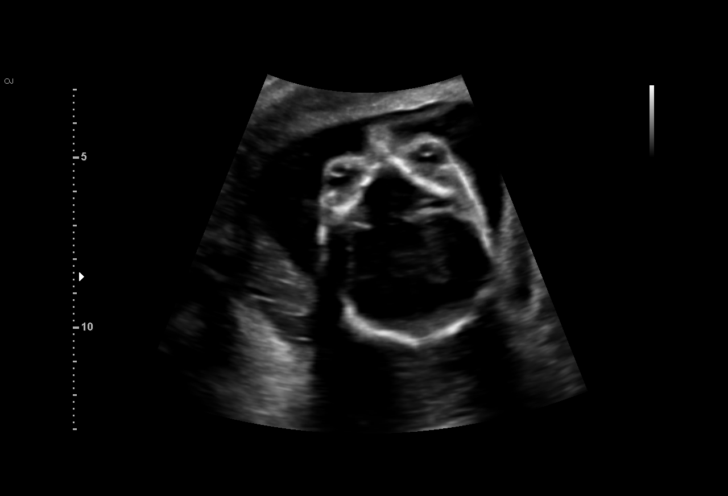
[im 38/51]
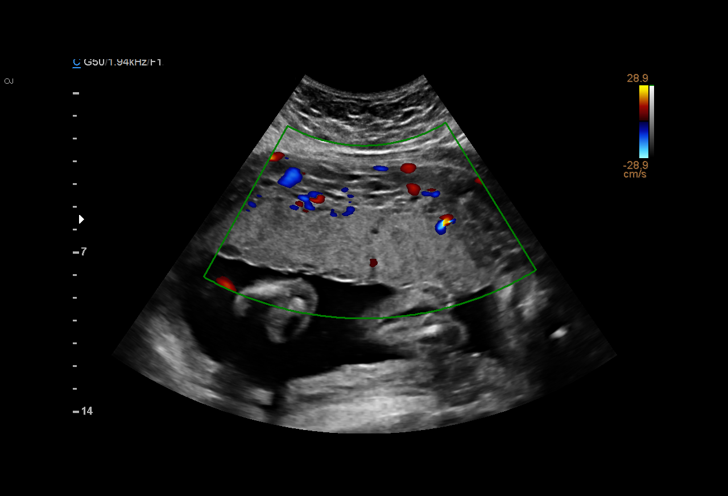
[im 41/51]
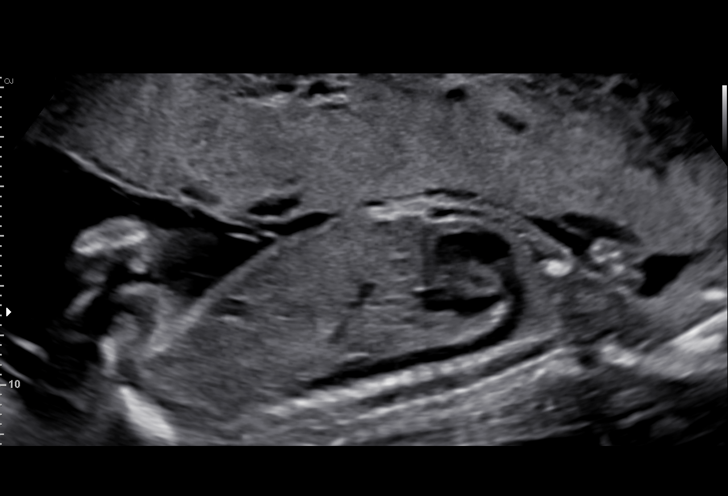
[im 45/51]
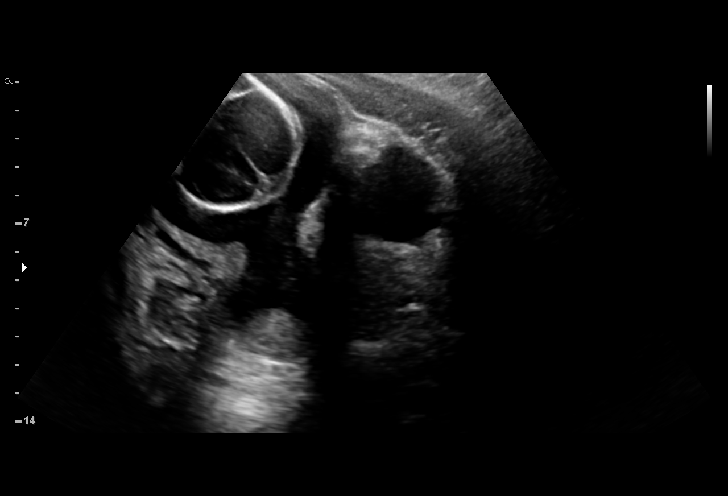
[im 49/51]
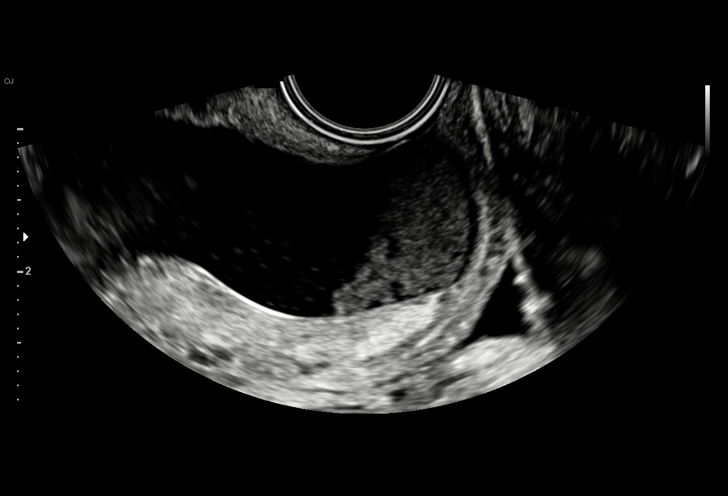

[13 of 28 positions shown; findings below may reference images not displayed]

Road [HOSPITAL]

1  MARGARETTE SOL            [id number]      [id number]     [id number]
Indications

22 weeks gestation of pregnancy
Diabetes - Pregestational, 2nd trimester
(A1c 6.2 on 02/12/16)
Pregnancy resulting from assisted
reproductive technology (Clomid)
Advanced maternal age primigravida 35+,
second trimester (low risk JaterniSPN)
OB History

Gravidity:    1         Term:   0        Prem:   0        SAB:   0
TOP:          0       Ectopic:  0        Living: 0
Fetal Evaluation

Num Of Fetuses:     1
Fetal Heart         149
Rate(bpm):
Cardiac Activity:   Observed
Presentation:       Cephalic
Placenta:           Anterior, above cervical os
P. Cord Insertion:  Visualized

Amniotic Fluid
AFI FV:      Subjectively within normal limits

Largest Pocket(cm)
3.7
Biometry
BPD:      51.6  mm     G. Age:  21w 5d         12  %    CI:         74.4   %    70 - 86
FL/HC:      20.0   %    19.2 -
HC:      189.9  mm     G. Age:  21w 2d        < 3  %    HC/AC:      1.07        1.05 -
AC:      177.2  mm     G. Age:  22w 4d         39  %    FL/BPD:     73.4   %    71 - 87
FL:       37.9  mm     G. Age:  22w 1d         21  %    FL/AC:      21.4   %    20 - 24
HUM:      37.4  mm     G. Age:  23w 1d         53  %
CER:      24.7  mm     G. Age:  22w 5d         50  %

CM:        4.3  mm

Est. FW:     488  gm      1 lb 1 oz     40  %
Gestational Age

LMP:           21w 5d        Date:  11/28/15                 EDD:   09/03/16
U/S Today:     22w 0d                                        EDD:   09/01/16
Best:          22w 5d     Det. By:  Early Ultrasound         EDD:   08/27/16
(02/04/16)
Anatomy

Cranium:               Appears normal         Aortic Arch:            Appears normal
Cavum:                 Appears normal         Ductal Arch:            Appears normal
Ventricles:            Appears normal         Diaphragm:              Appears normal
Choroid Plexus:        Appears normal         Stomach:                Appears normal, left
sided
Cerebellum:            Appears normal         Abdomen:                Appears normal
Posterior Fossa:       Previously seen        Abdominal Wall:         Previously seen
Nuchal Fold:           Previously seen        Cord Vessels:           Appears normal (3
vessel cord)
Face:                  Appears normal         Kidneys:                Appear normal
(orbits and profile)
Lips:                  Previously seen        Bladder:                Appears normal
Thoracic:              Appears normal         Spine:                  Previously seen
Heart:                 Appears normal         Upper Extremities:      Previously seen
(4CH, axis, and
situs)
RVOT:                  Appears normal         Lower Extremities:      Previously seen
LVOT:                  Appears normal

Other:  Female gender. Heels and 5th digit prev. visualized. Nasal bone prev.
visualized. Open hands prev. visualized.
Cervix Uterus Adnexa

Cervix
Appears funnelled, see comments.

Uterus
No abnormality visualized.

Left Ovary
Within normal limits.

Right Ovary
Within normal limits.
Impression

Single IUP at 22w 5d
Pregestational diabetes - not currently on medications
(VbRDQ 6.2)
Cephalic presentation
Normal interval anatomy
The estimated fetal weight today is at the 40th %tile.
Normal amniotic fluid volume

TVUS - no measurable cervix.  U-shaped funneling noted that
extends the full length of the cervix
Speculum exam performed - the cervix appears to be 
 2 cm
dilated with membranes visible at the os, but not into the
vagina

Had long discussion with the patient regarding findings.  At
this periviable state, would be hesitant to offer cerclage due
to risk of PROM. After counseling, the patient is willing to be
admitted for a course of Brenel and bedrest.
Recommendations

Admit
Ame - will be complete at 23 weeks, vaginal
progesterone
NICU consult

Would consider Magnesium sulfate for neuroprotection after
23 weeks if there is any clinical change.

## 2017-04-08 ENCOUNTER — Telehealth: Payer: Self-pay

## 2017-04-08 ENCOUNTER — Other Ambulatory Visit: Payer: Self-pay | Admitting: Certified Nurse Midwife

## 2017-04-08 DIAGNOSIS — Z3169 Encounter for other general counseling and advice on procreation: Secondary | ICD-10-CM

## 2017-04-08 DIAGNOSIS — E282 Polycystic ovarian syndrome: Secondary | ICD-10-CM

## 2017-04-08 NOTE — Telephone Encounter (Signed)
Returned call and pt stated that she wants to only speak to [place], CNM, advised pt that I would get a message to her, pt states that it is about a med but would not tell me anything else, routed to provider.

## 2017-04-12 ENCOUNTER — Other Ambulatory Visit: Payer: Self-pay | Admitting: Certified Nurse Midwife

## 2017-04-12 ENCOUNTER — Telehealth: Payer: Self-pay

## 2017-04-12 NOTE — Telephone Encounter (Signed)
Patient returned Rachelle call. Requesting call back from [place]

## 2017-04-12 NOTE — Telephone Encounter (Signed)
Phone call attempted, no answer on cell phone.  Call back requested.

## 2017-04-13 ENCOUNTER — Other Ambulatory Visit: Payer: Self-pay | Admitting: Certified Nurse Midwife

## 2017-04-13 DIAGNOSIS — E282 Polycystic ovarian syndrome: Secondary | ICD-10-CM

## 2017-04-13 NOTE — Telephone Encounter (Signed)
Discussed need for annual exam.  Patient to schedule annual exam.   R.Alise Calais CNM

## 2017-04-27 ENCOUNTER — Ambulatory Visit: Payer: Self-pay | Admitting: Certified Nurse Midwife

## 2017-06-03 ENCOUNTER — Ambulatory Visit: Payer: Self-pay | Admitting: Certified Nurse Midwife

## 2017-07-21 ENCOUNTER — Ambulatory Visit: Payer: Medicaid Other | Admitting: Certified Nurse Midwife

## 2017-09-16 ENCOUNTER — Inpatient Hospital Stay (HOSPITAL_COMMUNITY): Payer: Medicaid Other

## 2017-09-16 ENCOUNTER — Inpatient Hospital Stay (HOSPITAL_COMMUNITY)
Admission: AD | Admit: 2017-09-16 | Discharge: 2017-09-17 | Disposition: A | Discharge status: Home / Self Care | Payer: Medicaid Other | Source: Ambulatory Visit | Attending: Obstetrics and Gynecology | Admitting: Obstetrics and Gynecology

## 2017-09-16 DIAGNOSIS — Z3A01 Less than 8 weeks gestation of pregnancy: Secondary | ICD-10-CM | POA: Insufficient documentation

## 2017-09-16 DIAGNOSIS — O99211 Obesity complicating pregnancy, first trimester: Secondary | ICD-10-CM | POA: Diagnosis not present

## 2017-09-16 DIAGNOSIS — O99341 Other mental disorders complicating pregnancy, first trimester: Secondary | ICD-10-CM | POA: Insufficient documentation

## 2017-09-16 DIAGNOSIS — R102 Pelvic and perineal pain: Secondary | ICD-10-CM | POA: Diagnosis present

## 2017-09-16 DIAGNOSIS — O26851 Spotting complicating pregnancy, first trimester: Secondary | ICD-10-CM | POA: Diagnosis not present

## 2017-09-16 DIAGNOSIS — O26891 Other specified pregnancy related conditions, first trimester: Secondary | ICD-10-CM | POA: Insufficient documentation

## 2017-09-16 DIAGNOSIS — E282 Polycystic ovarian syndrome: Secondary | ICD-10-CM | POA: Diagnosis not present

## 2017-09-16 DIAGNOSIS — E669 Obesity, unspecified: Secondary | ICD-10-CM | POA: Diagnosis not present

## 2017-09-16 DIAGNOSIS — I10 Essential (primary) hypertension: Secondary | ICD-10-CM | POA: Diagnosis present

## 2017-09-16 DIAGNOSIS — O26899 Other specified pregnancy related conditions, unspecified trimester: Secondary | ICD-10-CM

## 2017-09-16 DIAGNOSIS — Z7984 Long term (current) use of oral hypoglycemic drugs: Secondary | ICD-10-CM | POA: Diagnosis not present

## 2017-09-16 DIAGNOSIS — F329 Major depressive disorder, single episode, unspecified: Secondary | ICD-10-CM | POA: Insufficient documentation

## 2017-09-16 DIAGNOSIS — O10919 Unspecified pre-existing hypertension complicating pregnancy, unspecified trimester: Secondary | ICD-10-CM | POA: Diagnosis present

## 2017-09-16 DIAGNOSIS — O99281 Endocrine, nutritional and metabolic diseases complicating pregnancy, first trimester: Secondary | ICD-10-CM | POA: Diagnosis not present

## 2017-09-16 DIAGNOSIS — O209 Hemorrhage in early pregnancy, unspecified: Secondary | ICD-10-CM

## 2017-09-16 DIAGNOSIS — O24911 Unspecified diabetes mellitus in pregnancy, first trimester: Secondary | ICD-10-CM | POA: Insufficient documentation

## 2017-09-16 DIAGNOSIS — O09291 Supervision of pregnancy with other poor reproductive or obstetric history, first trimester: Secondary | ICD-10-CM

## 2017-09-16 LAB — URINALYSIS, ROUTINE W REFLEX MICROSCOPIC
BILIRUBIN URINE: NEGATIVE
Bacteria, UA: NONE SEEN
GLUCOSE, UA: NEGATIVE mg/dL
KETONES UR: NEGATIVE mg/dL
NITRITE: NEGATIVE
Protein, ur: NEGATIVE mg/dL
Specific Gravity, Urine: 1.009 (ref 1.005–1.030)
pH: 6 (ref 5.0–8.0)

## 2017-09-16 LAB — CBC
HCT: 37.6 % (ref 36.0–46.0)
HEMOGLOBIN: 12.5 g/dL (ref 12.0–15.0)
MCH: 26 pg (ref 26.0–34.0)
MCHC: 33.2 g/dL (ref 30.0–36.0)
MCV: 78.3 fL (ref 78.0–100.0)
PLATELETS: 366 10*3/uL (ref 150–400)
RBC: 4.8 MIL/uL (ref 3.87–5.11)
RDW: 14.8 % (ref 11.5–15.5)
WBC: 13.9 10*3/uL — AB (ref 4.0–10.5)

## 2017-09-16 LAB — HCG, QUANTITATIVE, PREGNANCY: hCG, Beta Chain, Quant, S: 373 m[IU]/mL — ABNORMAL HIGH (ref <5)

## 2017-09-16 LAB — POCT PREGNANCY, URINE: Preg Test, Ur: POSITIVE — AB

## 2017-09-16 NOTE — MAU Note (Signed)
Spotting started 3-4 days ago.  Also having cramping that started this morning. Has not started care anywhere. LMP 08/11/17.  Last sex last night. Hx of loss at 5 months.

## 2017-09-17 ENCOUNTER — Encounter (HOSPITAL_COMMUNITY): Payer: Self-pay

## 2017-09-17 DIAGNOSIS — O209 Hemorrhage in early pregnancy, unspecified: Secondary | ICD-10-CM

## 2017-09-17 DIAGNOSIS — I10 Essential (primary) hypertension: Secondary | ICD-10-CM | POA: Diagnosis present

## 2017-09-17 DIAGNOSIS — O10919 Unspecified pre-existing hypertension complicating pregnancy, unspecified trimester: Secondary | ICD-10-CM | POA: Diagnosis present

## 2017-09-17 LAB — HIV ANTIBODY (ROUTINE TESTING W REFLEX): HIV Screen 4th Generation wRfx: NONREACTIVE

## 2017-09-17 LAB — WET PREP, GENITAL
Clue Cells Wet Prep HPF POC: NONE SEEN
Sperm: NONE SEEN
TRICH WET PREP: NONE SEEN
YEAST WET PREP: NONE SEEN

## 2017-09-17 NOTE — MAU Provider Note (Signed)
Chief Complaint: Vaginal Bleeding and Abdominal Pain   First Provider Initiated Contact with Patient 09/17/17 0021        SUBJECTIVE HPI: Deanna Walker is a 37 y.o. G2P0101 at 7125w2d by LMP who presents to maternity admissions reporting vaginal spotting and cramping.  Did have IC last night. Has a history of incompetent cervix with delivery at 22+ weeks which resulted in neonatal death a few weeks later. . She denies vaginal itching/burning, urinary symptoms, h/a, dizziness, n/v, or fever/chills.     Vaginal Bleeding  The patient's primary symptoms include pelvic pain and vaginal bleeding. The patient's pertinent negatives include no genital itching, genital lesions or genital odor. The current episode started in the past 7 days. The problem occurs intermittently. The pain is mild. She is pregnant. Associated symptoms include abdominal pain. Pertinent negatives include no constipation, diarrhea, dysuria or frequency. The vaginal discharge was bloody. The vaginal bleeding is spotting. She has not been passing clots. She has not been passing tissue. Nothing aggravates the symptoms. She has tried nothing for the symptoms.  Abdominal Pain  This is a new problem. The current episode started in the past 7 days. The onset quality is gradual. The problem occurs intermittently. The problem has been unchanged. The pain is located in the suprapubic region. The pain is mild. The quality of the pain is cramping. The abdominal pain does not radiate. Pertinent negatives include no constipation, diarrhea, dysuria or frequency.   RN Note: Spotting started 3-4 days ago.  Also having cramping that started this morning. Has not started care anywhere. LMP 08/11/17.  Last sex last night. Hx of loss at 5 months.      Past Medical History:  Diagnosis Date  . Abnormal Pap smear 2011   colpo and biopsy done, normal paps since  . Boil   . Boil of buttock   . Boil, axilla   . Depression   . Diabetes mellitus     pre diabetes- using metformin to bring numbers down  . Gestational diabetes   . Infertility associated with anovulation 01/24/2013  . Obesity   . PCOS (polycystic ovarian syndrome)   . Perennial and seasonal allergic rhinoconjunctivitis 09/25/2015  . Short cervix in second trimester, antepartum 04/28/2016   Past Surgical History:  Procedure Laterality Date  . ABCESS DRAINAGE    . COLPOSCOPY     Social History   Socioeconomic History  . Marital status: Single    Spouse name: Not on file  . Number of children: Not on file  . Years of education: Not on file  . Highest education level: Not on file  Social Needs  . Financial resource strain: Not on file  . Food insecurity - worry: Not on file  . Food insecurity - inability: Not on file  . Transportation needs - medical: Not on file  . Transportation needs - non-medical: Not on file  Occupational History  . Not on file  Tobacco Use  . Smoking status: Never Smoker  . Smokeless tobacco: Never Used  Substance and Sexual Activity  . Alcohol use: No    Alcohol/week: 0.0 oz    Comment: occasionally   . Drug use: No    Comment: past marijuana   . Sexual activity: Yes    Partners: Male    Birth control/protection: None  Other Topics Concern  . Not on file  Social History Narrative  . Not on file   No current facility-administered medications on file prior to encounter.  Current Outpatient Medications on File Prior to Encounter  Medication Sig Dispense Refill  . metFORMIN (GLUCOPHAGE) 500 MG tablet Take 1 tablet (500 mg total) by mouth 2 (two) times daily with a meal. 60 tablet 12  . Prenat-FeFmCb-DSS-FA-DHA w/o A (CITRANATAL HARMONY) 27-1-260 MG CAPS take 1 capsule by mouth once daily 30 capsule 12  . cetirizine (ZYRTEC) 10 MG tablet   0  . letrozole (FEMARA) 2.5 MG tablet Take 1 tablet (2.5 mg total) by mouth daily. Starting on day #3 and continuing until day number 7. 5 tablet 2  . Prenat-FeAsp-Meth-FA-DHA w/o A (PRENATE  PIXIE) 10-0.6-0.4-200 MG CAPS Take 1 tablet by mouth daily. 30 capsule 12  . Prenatal-DSS-FeCb-FeGl-FA (CITRANATAL BLOOM) 90-1 MG TABS Take 1 tablet by mouth daily. 30 tablet 12   No Known Allergies  I have reviewed patient's Past Medical Hx, Surgical Hx, Family Hx, Social Hx, medications and allergies.   ROS:  Review of Systems  Gastrointestinal: Positive for abdominal pain. Negative for constipation and diarrhea.  Genitourinary: Positive for pelvic pain and vaginal bleeding. Negative for dysuria and frequency.   Review of Systems  Other systems negative   Physical Exam  Physical Exam Patient Vitals for the past 24 hrs:  BP Temp Pulse Resp Height Weight  09/16/17 2151 (!) 142/90 98.8 F (37.1 C) 96 19 5\' 5"  (1.651 m) 193 lb 12 oz (87.9 kg)   Constitutional: Well-developed, well-nourished female in no acute distress.  Cardiovascular: normal rate Respiratory: normal effort GI: Abd soft, non-tender. Pos BS x 4 MS: Extremities nontender, no edema, normal ROM Neurologic: Alert and oriented x 4.  GU: Neg CVAT.  PELVIC EXAM: Cervix long (approx 3-4cm long) and closed.  LAB RESULTS Results for orders placed or performed during the hospital encounter of 09/16/17 (from the past 24 hour(s))  Urinalysis, Routine w reflex microscopic     Status: Abnormal   Collection Time: 09/16/17  9:52 PM  Result Value Ref Range   Color, Urine YELLOW YELLOW   APPearance CLEAR CLEAR   Specific Gravity, Urine 1.009 1.005 - 1.030   pH 6.0 5.0 - 8.0   Glucose, UA NEGATIVE NEGATIVE mg/dL   Hgb urine dipstick MODERATE (A) NEGATIVE   Bilirubin Urine NEGATIVE NEGATIVE   Ketones, ur NEGATIVE NEGATIVE mg/dL   Protein, ur NEGATIVE NEGATIVE mg/dL   Nitrite NEGATIVE NEGATIVE   Leukocytes, UA TRACE (A) NEGATIVE   RBC / HPF 0-5 0 - 5 RBC/hpf   WBC, UA 0-5 0 - 5 WBC/hpf   Bacteria, UA NONE SEEN NONE SEEN   Squamous Epithelial / LPF 0-5 (A) NONE SEEN   Mucus PRESENT   hCG, quantitative, pregnancy      Status: Abnormal   Collection Time: 09/16/17 10:31 PM  Result Value Ref Range   hCG, Beta Chain, Quant, S 373 (H) <5 mIU/mL  CBC     Status: Abnormal   Collection Time: 09/16/17 10:31 PM  Result Value Ref Range   WBC 13.9 (H) 4.0 - 10.5 K/uL   RBC 4.80 3.87 - 5.11 MIL/uL   Hemoglobin 12.5 12.0 - 15.0 g/dL   HCT 16.1 09.6 - 04.5 %   MCV 78.3 78.0 - 100.0 fL   MCH 26.0 26.0 - 34.0 pg   MCHC 33.2 30.0 - 36.0 g/dL   RDW 40.9 81.1 - 91.4 %   Platelets 366 150 - 400 K/uL  Pregnancy, urine POC     Status: Abnormal   Collection Time: 09/16/17 10:36 PM  Result Value Ref  Range   Preg Test, Ur POSITIVE (A) NEGATIVE       IMAGING US Ob Comp Less 14 Wks  Result Date: 09/16/2017 CLINICAL DATA:  Initial evaluation for vaginal bleeding, early pregnancy. EXAM: OBSTETRIC <14 WK [place] AND TRANSVAGINAL OB US TECHNIQUE: Both transabdominal and transvaginal ultrasound examinations were performed for complete evaluation of the gestation as well as the maternal uterus, adnexal regions, and pelvic cul-de-sac. Transvaginal technique was performed to assess early pregnancy. COMPARISON:  None. FINDINGS: Intrauterine gestational sac: Not visualized. Yolk sac:  N/A Embryo:  N/A Cardiac Activity: N/A Heart Rate: N/A  bpm Subchorionic hemorrhage:  None visualized. Maternal uterus/adnexae: Ovaries are normal in appearance bilaterally. No adnexal mass. Small volume free fluid present within the pelvis. IMPRESSION: 1. Early pregnancy with no discrete IUP or adnexal mass identified. Finding is consistent with a pregnancy of unknown anatomic location at this time. Differential considerations include IUP to early to visualize, recent SAB, or possibly occult ectopic pregnancy. Close clinical monitoring with serial beta HCGs and close interval follow-up ultrasound recommended as clinically warranted. 2. No other acute maternal uterine or adnexal abnormality identified. Electronically Signed   By: Rise Mu M.D.   On:  09/16/2017 23:44   US Ob Transvaginal  Result Date: 09/16/2017 CLINICAL DATA:  Initial evaluation for vaginal bleeding, early pregnancy. EXAM: OBSTETRIC <14 WK [place] AND TRANSVAGINAL OB US TECHNIQUE: Both transabdominal and transvaginal ultrasound examinations were performed for complete evaluation of the gestation as well as the maternal uterus, adnexal regions, and pelvic cul-de-sac. Transvaginal technique was performed to assess early pregnancy. COMPARISON:  None. FINDINGS: Intrauterine gestational sac: Not visualized. Yolk sac:  N/A Embryo:  N/A Cardiac Activity: N/A Heart Rate: N/A  bpm Subchorionic hemorrhage:  None visualized. Maternal uterus/adnexae: Ovaries are normal in appearance bilaterally. No adnexal mass. Small volume free fluid present within the pelvis. IMPRESSION: 1. Early pregnancy with no discrete IUP or adnexal mass identified. Finding is consistent with a pregnancy of unknown anatomic location at this time. Differential considerations include IUP to early to visualize, recent SAB, or possibly occult ectopic pregnancy. Close clinical monitoring with serial beta HCGs and close interval follow-up ultrasound recommended as clinically warranted. 2. No other acute maternal uterine or adnexal abnormality identified. Electronically Signed   By: Rise Mu M.D.   On: 09/16/2017 23:44    MAU Management/MDM: Ordered usual first trimester r/o ectopic labs.   Pelvic exam and cultures done Will check baseline Ultrasound to rule out ectopic.  This bleeding/pain can represent a normal pregnancy with bleeding, spontaneous abortion or even an ectopic which can be life-threatening.  The process as listed above helps to determine which of these is present.  HCG is low, which may represent an early pregnancy or abnormal pregnancy. We recommend repeat HCG in 48 hours then repeat US if doubles in 10 days   ASSESSMENT 1. Pelvic pain affecting pregnancy   2.    Spotting in early  pregnancy  PLAN Discharge home Plan to repeat HCG level in 48 hours in MAU Will repeat  Ultrasound in about 7-10 days if HCG levels double appropriately  Ectopic precautions   Pt stable at time of discharge. Encouraged to return here or to other Urgent Care/ED if she develops worsening of symptoms, increase in pain, fever, or other concerning symptoms.    Wynelle Bourgeois CNM, MSN Certified Nurse-Midwife 09/17/2017  12:21 AM

## 2017-09-17 NOTE — Discharge Instructions (Signed)
Primeiro trimestre Scientist, research (medical) First Trimester of Pregnancy O primeiro trimestre da gestao vai da semana 1 at o final da semana 13 (ms 1 ao ms 3). Uma semana aps a fertilizao de um vulo por um espermatozoide, o vulo ser fixado na parede do tero. Esse embrio comear a se transformar em um beb. Seus genes e os genes de seu parceiro formaro o beb. Os genes masculinos determinam se o beb ser um menino ou uma menina. Nas semanas 6-8, os olhos e o rosto so formados e o batimento cardaco pode ser visto no ultrassom. No final da semana 12, todos os rgos do beb esto formados. Agora que voc est grvida, voc desejar fazer tudo que pode para ter um beb saudvel. Duas das coisas mais importantes so: Armed forces training and education officer bons cuidados pr-natais e seguir as instrues de seu mdico. Os cuidados pr-natais incluem todos os cuidados mdicos que voc recebe antes do nascimento do seu beb. Essa assistncia ajuda a prevenir, encontrar e tratar qualquer problema durante a gestao e o parto. Alteraes corporais durante o primeiro trimestre Seu corpo passa por muitas mudanas durante a gestao. As mudanas variam de mulher para [employer].  Voc poder ganhar ou perder alguns quilos no incio.  Voc pode sentir seu estmago embrulhado (enjoo) e pode vomitar (vmito). Se o vmito for incontrolvel, ligue para o seu mdico.  Voc poder se sentir cansada facilmente.  Voc poder sentir dores de cabea que podem ser [place] por medicamentos. Todos os medicamentos devero ser aprovados pelo seu mdico.  Voc poder urinar mais frequentemente. Sentir dor Engineer, maintenance (IT) pode indicar que voc tem uma infeco urinria.  Voc poder sentir Publishing copy Hazel Sams.  Voc poder ficar com o intestino preso porque certos hormnios fazem com que os msculos que empurram as fezes em seu intestino fiquem mais lentos.  Voc poder apresentar hemorroidas ou veias inchadas e salientes (veias  varicosas).  Seus seios podero comear a crescer e ficar sensveis. Seus mamilos podero ficar mais protuberantes, e o tecido ao redor deles (arola) mais escuro.  Suas gengivas podero sangrar e ficar sensveis  escovao e ao uso do fio dental.  Marcas escuras (cloasmas, mscaras de gravidez) podero surgir no seu rosto. Elas tendem a desaparecer depois do nascimento do beb.  Seus perodos menstruais cessaro.  Voc poder sentir falta de apetite.  Voc poder sentir vontade de comer certos tipos de comida.  Voc poder sentir alteraes em suas emoes diariamente, como estar animada por estar grvida ou se preocupar que algo d errado com a gestao e com o beb.  Voc poder ter sonhos mais intensos ou estranhos.  Voc poder notar alteraes nos seus cabelos. Essas alteraes incluem espessamento, crescimento rpido e alteraes na textura do seu cabelo. Algumas mulheres tambm sofrem queda de cabelos durante ou aps a gravidez, ou cabelos com aspecto seco e fino. Seus cabelos provavelmente voltaro ao normal depois do nascimento do beb.  O que esperar das suas consultas pr-natais Durante uma consulta pr-natal de rotina:  Voc ser pesada para garantir que voc e o beb estejam se desenvolvendo normalmente.  Sua presso arterial ser aferida.  Seu abdome ser medido para acompanhar o crescimento de seu beb.  O batimento cardaco fetal ser [employer] a semana 10 e a semana 14 de [place].  Os resultados de todas as consultas anteriores sero discutidos.  Seu mdico poder lhe perguntar:  Como voc est se sentindo.  Se voc est sentindo o beb se mexer.  Se voc  teve algum sintoma anormal, como vazamento de lquido, sangramento, dores de cabea intensas ou clicas abdominais.  Se voc est usando derivados do tabaco, incluindo cigarros, tabaco de Theatre managermascar ou cigarros eletrnicos.  Se voc tem alguma dvida.  Outros exames que podero ser realizados  durante seu primeiro trimestre incluem:  Exames de sangue para detectar seu tipo sanguneo e para verificar a presena de qualquer infeco anterior. Os exames tambm sero usados para verificar se os nveis de ferro esto baixos (anemia) e os nveis de protenas e hemcias (anticorpos de Rh). Dependendo de seus fatores de risco ou de voc j ter sofrido de diabetes durante a gravidez, Higher education careers adviservoc poder fazer exames para verificar se est com acar no sangue elevado, o que pode afetar gestantes (diabetes gestacional).  Exames de urina para verificar se h infeces, diabetes ou protena na urina.  Um ultrassom para confirmar o crescimento e desenvolvimento correto do beb.  Exames preventivos do feto para verificar problemas na medula vertebral (espinha bfida) e sndrome de Down.  Exame de HIV (vrus da imunodeficincia humana). Os exames pr-natais de rotina incluem testes de HIV, a menos que voc opte por no realizar esse teste.  Voc poder precisar de outros exames para garantir que voc e o beb esto indo bem.  Siga essas instrues em casa: Medicamentos  Siga as instrues do seu mdico com relao ao uso de medicamentos. Medicamentos especficos podem ser seguros ou perigosos de serem tomados durante a Manufacturing systems engineergestao.  Tome vitaminas pr-natais que contenham pelo menos 600 microgramas (mcg) de cido flico.  Se sentir constipao, experimente tomar um emoliente fecal, caso seu mdico aprove isso. Alimentos e ZOXWRUEbebidas  Rodney Cruiseenha uma dieta equilibrada que inclua frutas e verduras frescas, gros integrais e boas fontes de protena, como carne, ovos ou tofu e produtos lcteos com baixo teor de [place]. Seu mdico ajudar a determinar a quantidade de ganho de peso correta para voc.  Evite carnes cruas e queijo no cozido. Eles carregam germes que podem causar defeitos de nascena no beb.  Fazer Comptrollerquatro ou cinco pequenas refeies em vez de trs refeies maiores pode ajudar a Paramedicaliviar o enjoo e o  vmito. Se voc comear a sentir enjoo, comer algumas bolachas pode ajudar. A ingesto de lquidos entre as Musicianrefeies em vez de durante as refeies, tambm parece ajudar a evitar enjoo e vmito.  Limite os alimentos ricos em gordura e acares processados, tais como frituras e doces.  Para prevenir a constipao: ? Coma alimentos ricos em [place], como frutas e verduras frescas, gros integrais e feijo. ? Beba lquidos em quantidade suficiente para manter sua urina clara ou na cor amarela plida. Atividades  Faa exerccios apenas conforme orientado pelo seu mdico. A maioria das mulheres pode continuar seus exerccios usuais durante a Environmental education officergravidez. Tente fazer exerccios por 30 minutos pelo menos 5 dias por semana. Os exerccios ajudam a: ? Controlar o peso. ? Ficar em forma. ? Estar preparada para o trabalho de parto e o parto.  Sentir dores ou cibras na parte inferior do abdome ou das costas  um bom sinal para voc parar de Chief Strategy Officerse exercitar. Consulte seu mdico antes de continuar os exerccios regulares.  Tente evitar ficar em p por longos perodos de tempo. Mova suas pernas com frequncia se precisar ficar em p em um s local por muito tempo.  Evite levantar peso.  Use sapatos de salto baixo e mantenha uma boa postura corporal.  Voc pode continuar a ter relaes sexuais, a menos que seu mdico a oriente  de [employer]. Alvio da dor e do desconforto  Use um bom suti de sustentao para aliviar a sensibilidade nos seios.  Tome banhos mornos de assento para Child psychotherapist dor ou desconforto causado pelas hemorroidas. Use um creme para hemorroidas caso seu mdico aprove.  Repouse com as pernas elevadas se tiver cibras nas pernas ou dor lombar.  Se surgirem veias varicosas em suas pernas, use meias elsticas. Eleve os ps por 15 minutos 3-4 vezes por dia. Modere a quantidade de sal em sua dieta. Cuidados pr-natais  Agende suas consultas pr-natais at a dcima segunda semana  de [place]. Geralmente, elas so agendadas mensalmente no incio e depois ocorrem com maior frequncia nos ltimos 2 meses antes do parto.  Anote suas perguntas. Leve-as s consultas pr-natais.  Continue a comparecer a todas as suas consultas como orientado pelo seu mdico. Isso  importante. Segurana  Sempre use o cinto de Music therapist.  Faa uma lista de nmeros de telefone de Museum/gallery exhibitions officer, incluindo nmeros de parentes, amigos, do hospital, da [employer]. Instrues gerais  Pea uma indicao de aulas locais de educao pr-natal ao seu mdico. Comece as aulas antes do incio do ms 6 de [employer].  Pea ajuda caso precise de aconselhamento ou de informaes nutricionais durante a gestao. Seu mdico poder oferecer orientao ou encaminh-la a um especialista para que voc obtenha ajuda sobre necessidades diversas.  No use banheiras de agu quente, saunas secas [address].  No tome duchas e no use absorventes internos ou absorventes perfumados.  No cruze suas pernas por longos perodos de tempo.  Mantenha distncia de caixas de areia de gatos e de terrenos onde houver gatos. Eles carregam germes que podem causar defeitos de nascena no beb e possivelmente a perda do feto por aborto espontneo ou parto de natimorto.  Evite fumo de qualquer tipo, fitoterpicos, lcool e medicamentos no prescritos pelo seu mdico. As substncias qumicas desses produtos afetam a formao e o crescimento do beb.  No use nenhum produto que contenha nicotina ou tabaco, como cigarros e cigarros eletrnicos. Caso precise de ajuda para parar de fumar, fale com seu mdico. Voc poder receber Jerene Bears e outros recursos para lhe ajudar a parar.  Agende uma consulta com um dentista. Em casa, use uma escova de dentes macia para escovar os dentes e use o fio dental com cuidado. Entre em contato com um mdico se:  Tiver vertigem.  Tiver cibra plvica suave, presso plvica ou  dor persistente na regio abdominal.  Ocorrerem enjoo, vmito ou diarreia persistentes.  Ocorrer corrimento vaginal com cheiro ruim.  Sentir dor Engineer, maintenance (IT).  Notar um inchao em seu rosto, mos, pernas ou tornozelos.  For exposta a eritema infeccioso ou catapora.  For exposta a sarampo (rubola) e nunca tiver contrado a doena. Tomasa Hose ajuda imediatamente se:  Tiver febre.  Notar secreo em sua vagina.  Perceber sangramento de escape ou hemorragia vaginal.  Sentir dor ou cibra intensa no abdome.  Sofrer perda ou ganho sbito de [place].  Vomitar sangue ou material parecido com borra de caf.  Sentir dor de cabea intensa.  Tiver falta de ar.  Sofrer qualquer tipo de trauma, como uma queda ou um acidente de carro. Resumo  O primeiro trimestre da gestao vai da semana 1 at o final da semana 13 (ms 1 ao ms 3).  Seu corpo passa por muitas mudanas durante a gestao. As mudanas variam de mulher para [employer].  Voc far consultas pr-natais de rotina. Durante essas consultas,  seu mdico examinar voc, discutir quaisquer resultados de exames que voc possa ter e conversar sobre como voc est se sentindo. Estas informaes no se destinam a substituir as recomendaes de seu mdico. No deixe de discutir quaisquer dvidas com seu mdico. Document Released: 10/14/2015 Document Revised: 10/22/2016 Document Reviewed: 10/22/2016 Elsevier Interactive Patient Education  2018 Elsevier Inc. Vaginal Bleeding During Pregnancy, First Trimester A small amount of bleeding (spotting) from the vagina is common in early pregnancy. Sometimes the bleeding is normal and is not a problem, and sometimes it is a sign of something serious. Be sure to tell your doctor about any bleeding from your vagina right away. Follow these instructions at home:  Watch your condition for any changes.  Follow your doctor's instructions about how active you can be.  If you are on bed rest: ? You may  need to stay in bed and only get up to use the bathroom. ? You may be allowed to do some activities. ? If you need help, make plans for someone to help you.  Write down: ? The number of pads you use each day. ? How often you change pads. ? How soaked (saturated) your pads are.  Do not use tampons.  Do not douche.  Do not have sex or orgasms until your doctor says it is okay.  If you pass any tissue from your vagina, save the tissue so you can show it to your doctor.  Only take medicines as told by your doctor.  Do not take aspirin because it can make you bleed.  Keep all follow-up visits as told by your doctor. Contact a doctor if:  You bleed from your vagina.  You have cramps.  You have labor pains.  You have a fever that does not go away after you take medicine. Get help right away if:  You have very bad cramps in your back or belly (abdomen).  You pass large clots or tissue from your vagina.  You bleed more.  You feel light-headed or weak.  You pass out (faint).  You have chills.  You are leaking fluid or have a gush of fluid from your vagina.  You pass out while pooping (having a bowel movement). This information is not intended to replace advice given to you by your health care provider. Make sure you discuss any questions you have with your health care provider. Document Released: 11/06/2013 Document Revised: 11/28/2015 Document Reviewed: 02/27/2013 Elsevier Interactive Patient Education  [employer].

## 2017-09-19 ENCOUNTER — Inpatient Hospital Stay (HOSPITAL_COMMUNITY)
Admission: AD | Admit: 2017-09-19 | Discharge: 2017-09-19 | Disposition: A | Discharge status: Home / Self Care | Payer: Medicaid Other | Source: Ambulatory Visit | Attending: Obstetrics & Gynecology | Admitting: Obstetrics & Gynecology

## 2017-09-19 ENCOUNTER — Other Ambulatory Visit: Payer: Self-pay

## 2017-09-19 DIAGNOSIS — O24311 Unspecified pre-existing diabetes mellitus in pregnancy, first trimester: Secondary | ICD-10-CM | POA: Insufficient documentation

## 2017-09-19 DIAGNOSIS — O10911 Unspecified pre-existing hypertension complicating pregnancy, first trimester: Secondary | ICD-10-CM | POA: Diagnosis not present

## 2017-09-19 DIAGNOSIS — O3680X0 Pregnancy with inconclusive fetal viability, not applicable or unspecified: Secondary | ICD-10-CM

## 2017-09-19 DIAGNOSIS — O99281 Endocrine, nutritional and metabolic diseases complicating pregnancy, first trimester: Secondary | ICD-10-CM | POA: Diagnosis not present

## 2017-09-19 DIAGNOSIS — O99211 Obesity complicating pregnancy, first trimester: Secondary | ICD-10-CM | POA: Diagnosis not present

## 2017-09-19 DIAGNOSIS — E119 Type 2 diabetes mellitus without complications: Secondary | ICD-10-CM | POA: Diagnosis not present

## 2017-09-19 DIAGNOSIS — Z3491 Encounter for supervision of normal pregnancy, unspecified, first trimester: Secondary | ICD-10-CM | POA: Insufficient documentation

## 2017-09-19 DIAGNOSIS — F329 Major depressive disorder, single episode, unspecified: Secondary | ICD-10-CM | POA: Diagnosis not present

## 2017-09-19 DIAGNOSIS — Z3A01 Less than 8 weeks gestation of pregnancy: Secondary | ICD-10-CM | POA: Diagnosis not present

## 2017-09-19 DIAGNOSIS — E282 Polycystic ovarian syndrome: Secondary | ICD-10-CM | POA: Insufficient documentation

## 2017-09-19 DIAGNOSIS — E669 Obesity, unspecified: Secondary | ICD-10-CM | POA: Diagnosis not present

## 2017-09-19 DIAGNOSIS — O99341 Other mental disorders complicating pregnancy, first trimester: Secondary | ICD-10-CM | POA: Diagnosis not present

## 2017-09-19 DIAGNOSIS — O10919 Unspecified pre-existing hypertension complicating pregnancy, unspecified trimester: Secondary | ICD-10-CM

## 2017-09-19 LAB — HCG, QUANTITATIVE, PREGNANCY: hCG, Beta Chain, Quant, S: 527 m[IU]/mL — ABNORMAL HIGH (ref <5)

## 2017-09-19 NOTE — MAU Note (Signed)
Pt presents for repeat HCG levels.  Reports continued spotting & slight abdominal cramping.

## 2017-09-19 NOTE — MAU Provider Note (Signed)
Deanna Walker  is a 37 y.o. G2P0100 at 6259w4d who presents to MAU today for follow-up quant hCG after 48 hours. The patient was seen in MAU on 09-17-17 for spotting and cramping @5wks  and had quant hCG of 373 and US showed no IUP or adnexal mass. She endorses intermittent mild cramping. Having some brown spotting. No fever today.   OB History  Gravida Para Term Preterm AB Living  2 1   1    0  SAB TAB Ectopic Multiple Live Births          1    # Outcome Date GA Lbr Len/2nd Weight Sex Delivery Anes PTL Lv  2 Current           1 Preterm 05/02/16 6333w2d / 00:07 15.9 oz (0.45 kg) F Vag-Spont None  ND     Complications: Neonatal death     Birth Comments: Baby was born alive and went to NICU but died within weeks afterward      Past Medical History:  Diagnosis Date  . Abnormal Pap smear 2011   colpo and biopsy done, normal paps since  . Boil   . Boil of buttock   . Boil, axilla   . Depression   . Diabetes mellitus    pre diabetes- using metformin to bring numbers down  . Gestational diabetes   . Infertility associated with anovulation 01/24/2013  . Obesity   . PCOS (polycystic ovarian syndrome)   . Perennial and seasonal allergic rhinoconjunctivitis 09/25/2015  . Short cervix in second trimester, antepartum 04/28/2016    ROS: + VB + pain  BP 135/69 (BP Location: Right Arm)   Pulse 87   Temp 97.7 F (36.5 C) (Oral)   Resp 18   Ht 5\' 5"  (1.651 m)   Wt 195 lb 12 oz (88.8 kg)   LMP 08/11/2017   SpO2 99%   BMI 32.57 kg/m   CONSTITUTIONAL: Well-developed, well-nourished female in no acute distress.  MUSCULOSKELETAL: Normal range of motion.  CARDIOVASCULAR: Regular heart rate RESPIRATORY: Normal effort NEUROLOGICAL: Alert and oriented to person, place, and time.  SKIN: Not diaphoretic. No erythema. No pallor. PSYCH: Normal mood and affect. Normal behavior. Normal judgment and thought content.  Results for orders placed or performed during the hospital encounter of  09/19/17 (from the past 24 hour(s))  hCG, quantitative, pregnancy     Status: Abnormal   Collection Time: 09/19/17 11:24 AM  Result Value Ref Range   hCG, Beta Chain, Quant, S 527 (H) <5 mIU/mL    MDM: Abnormal rise in quant. Consult with Dr. Despina HiddenEure. Plan for f/u quant in 2 days. Stable for discharge home.  A: Pregnancy of unknown location  P: Discharge home First trimester/ectopic precautions discussed Patient will return for follow-up quant HCG in [facility] on 09/21/17 @11 :00 a, Patient may return to MAU as needed or if her condition were to change or worsen   Donette LarryBhambri, Mylo Driskill, CNM 09/19/2017 1:02 PM

## 2017-09-20 LAB — GC/CHLAMYDIA PROBE AMP (~~LOC~~) NOT AT ARMC
Chlamydia: NEGATIVE
NEISSERIA GONORRHEA: NEGATIVE

## 2017-09-21 ENCOUNTER — Ambulatory Visit (INDEPENDENT_AMBULATORY_CARE_PROVIDER_SITE_OTHER): Payer: Medicaid Other

## 2017-09-21 DIAGNOSIS — Z3490 Encounter for supervision of normal pregnancy, unspecified, unspecified trimester: Secondary | ICD-10-CM

## 2017-09-21 DIAGNOSIS — O3680X0 Pregnancy with inconclusive fetal viability, not applicable or unspecified: Secondary | ICD-10-CM

## 2017-09-21 LAB — HCG, QUANTITATIVE, PREGNANCY: HCG, BETA CHAIN, QUANT, S: 674 m[IU]/mL — AB (ref <5)

## 2017-09-21 NOTE — Progress Notes (Signed)
Pt here today for STAT beta lab.  Pt denies any sx at this time.  Pt is waiting in the waiting room for results and is aware that it can take up to 2 hours for results.  Pt stated understanding.  Notified Dr. Rachelle HoraMoss of pt's beta results.  Provider recommendation to have an US today or tomorrow since pt currently asymptomatic.  OB US scheduled for 09/22/17 @ 1000.  Pt is instructed to come to the office after US for results/f/u.  Pt agreed.

## 2017-09-22 ENCOUNTER — Ambulatory Visit (HOSPITAL_COMMUNITY)
Admission: RE | Admit: 2017-09-22 | Discharge: 2017-09-22 | Disposition: A | Discharge status: Home / Self Care | Payer: Medicaid Other | Source: Ambulatory Visit | Attending: Family Medicine | Admitting: Family Medicine

## 2017-09-22 ENCOUNTER — Inpatient Hospital Stay (HOSPITAL_COMMUNITY)
Admission: AD | Admit: 2017-09-22 | Discharge: 2017-09-22 | Disposition: A | Discharge status: Home / Self Care | Payer: Medicaid Other | Source: Ambulatory Visit | Attending: Obstetrics and Gynecology | Admitting: Obstetrics and Gynecology

## 2017-09-22 ENCOUNTER — Other Ambulatory Visit: Payer: Self-pay

## 2017-09-22 ENCOUNTER — Ambulatory Visit (HOSPITAL_COMMUNITY): Payer: Medicaid Other

## 2017-09-22 ENCOUNTER — Encounter (HOSPITAL_COMMUNITY): Payer: Self-pay

## 2017-09-22 DIAGNOSIS — R9389 Abnormal findings on diagnostic imaging of other specified body structures: Secondary | ICD-10-CM | POA: Diagnosis not present

## 2017-09-22 DIAGNOSIS — Z833 Family history of diabetes mellitus: Secondary | ICD-10-CM | POA: Insufficient documentation

## 2017-09-22 DIAGNOSIS — Z8249 Family history of ischemic heart disease and other diseases of the circulatory system: Secondary | ICD-10-CM | POA: Diagnosis not present

## 2017-09-22 DIAGNOSIS — O3680X0 Pregnancy with inconclusive fetal viability, not applicable or unspecified: Secondary | ICD-10-CM | POA: Insufficient documentation

## 2017-09-22 DIAGNOSIS — Z9889 Other specified postprocedural states: Secondary | ICD-10-CM | POA: Insufficient documentation

## 2017-09-22 DIAGNOSIS — O24419 Gestational diabetes mellitus in pregnancy, unspecified control: Secondary | ICD-10-CM | POA: Diagnosis not present

## 2017-09-22 DIAGNOSIS — O99211 Obesity complicating pregnancy, first trimester: Secondary | ICD-10-CM | POA: Diagnosis not present

## 2017-09-22 DIAGNOSIS — O209 Hemorrhage in early pregnancy, unspecified: Secondary | ICD-10-CM

## 2017-09-22 DIAGNOSIS — Z3A01 Less than 8 weeks gestation of pregnancy: Secondary | ICD-10-CM | POA: Insufficient documentation

## 2017-09-22 DIAGNOSIS — N939 Abnormal uterine and vaginal bleeding, unspecified: Secondary | ICD-10-CM | POA: Diagnosis present

## 2017-09-22 NOTE — MAU Provider Note (Signed)
History     CSN: [id number]  Arrival date and time: 09/22/17 1200   First Provider Initiated Contact with Patient 09/22/17 1214      Chief Complaint  Patient presents with  . Vaginal Bleeding   HPI  Ms. Deanna Walker is a 37 y.o. G2P0100 at 7329w0d who presents to MAU today for management of US results. The patient has had 2 inappropriately rising quat hCGs the last was yesterday. She was sent for US this morning and Dr. Ashok PallWouk was called from Radiology with concern for a possible ectopic pregnancy due to a mass near the ovary. The patient states that she has had occasional dark brown discharge, but denies abdominal pain or fever.   OB History    Gravida Para Term Preterm AB Living   2 1   1    0   SAB TAB Ectopic Multiple Live Births           1      Past Medical History:  Diagnosis Date  . Abnormal Pap smear 2011   colpo and biopsy done, normal paps since  . Boil   . Boil of buttock   . Boil, axilla   . Depression   . Diabetes mellitus    pre diabetes- using metformin to bring numbers down  . Gestational diabetes   . Infertility associated with anovulation 01/24/2013  . Obesity   . PCOS (polycystic ovarian syndrome)   . Perennial and seasonal allergic rhinoconjunctivitis 09/25/2015  . Short cervix in second trimester, antepartum 04/28/2016    Past Surgical History:  Procedure Laterality Date  . ABCESS DRAINAGE    . COLPOSCOPY      Family History  Problem Relation Age of Onset  . Diabetes Maternal Aunt   . Hypertension Maternal Aunt   . Seizures Mother   . Allergic rhinitis Neg Hx   . Angioedema Neg Hx   . Asthma Neg Hx   . Atopy Neg Hx   . Eczema Neg Hx   . Immunodeficiency Neg Hx   . Urticaria Neg Hx   . Cystic fibrosis Neg Hx   . Lupus Neg Hx   . Emphysema Neg Hx   . Migraines Neg Hx     Social History   Tobacco Use  . Smoking status: Never Smoker  . Smokeless tobacco: Never Used  Substance Use Topics  . Alcohol use: No    Alcohol/week: 0.0  oz    Comment: occasionally   . Drug use: No    Comment: past marijuana     Allergies: No Known Allergies  No medications prior to admission.    Review of Systems  Constitutional: Negative for fever.  Gastrointestinal: Negative for abdominal pain, constipation, diarrhea, nausea and vomiting.  Genitourinary: Negative for vaginal bleeding and vaginal discharge.   Physical Exam   Blood pressure 132/74, pulse 97, temperature 98.9 F (37.2 C), temperature source Oral, resp. rate 18, height 5\' 5"  (1.651 m), weight 195 lb (88.5 kg), last menstrual period 08/11/2017.  Physical Exam  Nursing note and vitals reviewed. Constitutional: She is oriented to person, place, and time. She appears well-developed and well-nourished. No distress.  HENT:  Head: Normocephalic and atraumatic.  Cardiovascular: Normal rate.  Respiratory: Effort normal.  GI: Soft. She exhibits no distension and no mass. There is no tenderness. There is no rebound and no guarding.  Neurological: She is alert and oriented to person, place, and time.  Skin: Skin is warm and dry. No erythema.  Psychiatric:  She has a normal mood and affect.    US Ob Transvaginal  Result Date: 09/22/2017 CLINICAL DATA:  Pregnant patient with pregnancy of unknown location. Slowly rising beta HCG. EXAM: TRANSVAGINAL OB ULTRASOUND TECHNIQUE: Transvaginal ultrasound was performed for complete evaluation of the gestation as well as the maternal uterus, adnexal regions, and pelvic cul-de-sac. COMPARISON:  Pelvic ultrasound 09/16/2017 FINDINGS: Intrauterine gestational sac: None Yolk sac:  Not Visualized. Embryo:  Not Visualized. Cardiac Activity: Not Visualized. Maternal uterus/adnexae: The right ovary is mildly enlarged. Within the right adnexa, immediately adjacent to the right ovary, and moving with the right ovary with palpation, is a 2.5 x 1.9 x 2.3 cm mixed echogenicity area. The left ovary is normal in appearance. Small amount of free fluid in  the right adnexa and cul-de-sac. IMPRESSION: 1. No intrauterine gestation identified. 2. Within the right adnexa there is a possible mixed echogenicity mass which is immediately adjacent to the right ovary however is not completely definitive for ectopic pregnancy as the area of mixed echogenicity appears to move with the right ovary with palpation. The possibility of a small subtle ectopic pregnancy is not excluded. 3. These results were called by telephone at the time of interpretation on 09/22/2017 at 11:50 am to Dr. Ashok Pall, who verbally acknowledged these results. Electronically Signed   By: Annia Belt M.D.   On: 09/22/2017 11:57   Results for Deanna Walker (MRN [id number]) as of 09/22/2017 12:14  Ref. Range 09/16/2017 22:31 09/16/2017 22:36 09/16/2017 23:38 09/17/2017 00:35 09/19/2017 11:24 09/21/2017 11:40  HCG, Beta Chain, Quant, S Latest Ref Range: <5 mIU/mL 373 (H)    527 (H) 674 (H)   MAU Course  Procedures None  MDM Reviewed recent hCG results and [place] results.  Discussed patient with Dr. Ashok Pall. Patient is stable and declines MTX today. Dr. Ashok Pall agrees that we can have patient return on Friday for hCG and [place] to re-assess possible ectopic pregnancy. Patient is aware that we will likely need to make a definitive decision about management of the pregnancy on Friday after we have more results or sooner if her symptoms were to change or worsen. Strict ectopic precautions discussed. Patient verbalized understanding.   Assessment and Plan  A: Concern for ectopic pregnancy   P: Discharge home Strict ectopic precautions discussed Patient advised to follow-up in MAU on Friday for hCG and [place] and possibly MTX at that time depending on results Patient may return to MAU as needed or if her condition were to change or worsen  Vonzella Nipple, PA-C 09/22/2017, 1:01 PM

## 2017-09-22 NOTE — MAU Note (Signed)
Pt presents to MAU for U/S results.

## 2017-09-22 NOTE — Discharge Instructions (Signed)
Ectopic Pregnancy °An ectopic pregnancy happens when a fertilized egg grows outside the uterus. A pregnancy cannot live outside of the uterus. This problem often happens in the fallopian tube. It is often caused by damage to the fallopian tube. °If this problem is found early, you may be treated with medicine. If your tube tears or bursts open (ruptures), you will bleed inside. This is an emergency. You will need surgery. Get help right away. °What are the signs or symptoms? °You may have normal pregnancy symptoms at first. These include: °· Missing your period. °· Feeling sick to your stomach (nauseous). °· Being tired. °· Having tender breasts. ° °Then, you may start to have symptoms that are not normal. These include: °· Pain with sex (intercourse). °· Bleeding from the vagina. This includes light bleeding (spotting). °· Belly (abdomen) or lower belly cramping or pain. This may be felt on one side. °· A fast heartbeat (pulse). °· Passing out (fainting) after going poop (bowel movement). ° °If your tube tears, you may have symptoms such as: °· Really bad pain in the belly or lower belly. This happens suddenly. °· Dizziness. °· Passing out. °· Shoulder pain. ° °Get help right away if: °You have any of these symptoms. This is an emergency. °This information is not intended to replace advice given to you by your health care provider. Make sure you discuss any questions you have with your health care provider. °Document Released: 09/18/2008 Document Revised: 11/28/2015 Document Reviewed: 02/01/2013 °Elsevier Interactive Patient Education © 2017 Elsevier Inc. ° °

## 2017-09-24 ENCOUNTER — Inpatient Hospital Stay (HOSPITAL_COMMUNITY)
Admission: AD | Admit: 2017-09-24 | Discharge: 2017-09-24 | Disposition: A | Discharge status: Home / Self Care | Payer: Medicaid Other | Source: Ambulatory Visit | Attending: Obstetrics and Gynecology | Admitting: Obstetrics and Gynecology

## 2017-09-24 DIAGNOSIS — O209 Hemorrhage in early pregnancy, unspecified: Secondary | ICD-10-CM | POA: Diagnosis not present

## 2017-09-24 DIAGNOSIS — Z3A01 Less than 8 weeks gestation of pregnancy: Secondary | ICD-10-CM | POA: Insufficient documentation

## 2017-09-24 DIAGNOSIS — O3680X0 Pregnancy with inconclusive fetal viability, not applicable or unspecified: Secondary | ICD-10-CM | POA: Insufficient documentation

## 2017-09-24 DIAGNOSIS — Z08 Encounter for follow-up examination after completed treatment for malignant neoplasm: Secondary | ICD-10-CM | POA: Insufficient documentation

## 2017-09-24 LAB — HCG, QUANTITATIVE, PREGNANCY: HCG, BETA CHAIN, QUANT, S: 1117 m[IU]/mL — AB (ref <5)

## 2017-09-24 NOTE — MAU Provider Note (Signed)
Ms. Deanna Walker  is a 37 Walker.o. G2P0100 at 231w2d who presents to MAU today for follow-up quant hCG after 48 hours. She denies any abdominal or pelvic pain, vaginal bleeding, fevers, dizziness.   BP 139/77 (BP Location: Right Arm)   Pulse (!) 111   Temp 98.7 F (37.1 C) (Oral)   Resp 18   LMP 08/11/2017   CONSTITUTIONAL: Well-developed, well-nourished female in no acute distress.  ENT: External right and left ear normal.  EYES: EOM intact, conjunctivae normal.  MUSCULOSKELETAL: Normal range of motion.  CARDIOVASCULAR: Regular heart rate RESPIRATORY: Normal effort NEUROLOGICAL: Alert and oriented to person, place, and time.  SKIN: Skin is warm and dry. No rash noted. Not diaphoretic. No erythema. No pallor. PSYCH: Anxious   Results for orders placed or performed during the hospital encounter of 09/24/17  hCG, quantitative, pregnancy  Result Value Ref Range   hCG, Beta Chain, Quant, S 1,117 (H) <5 mIU/mL    A: Appropriate rise in quant hCG after 48 hours Pregnancy of unknown anatomic location Concern for ectopic pregnancy  P: Discharge home ins table condition First trimester/ectopic precautions discussed Repeat US on Monday with follow-up in clinic for results and repeat b-hcg Orders placed. They will call the patient with an appointment time Patient may return to MAU as needed or if her condition were to change or worsen   Case discussed with Dr. Jolayne Pantheronstant.  Pincus Largehelps, Deanna Hase Y, DO 09/24/2017 2:45 PM

## 2017-09-24 NOTE — Discharge Instructions (Signed)
Ectopic Pregnancy °An ectopic pregnancy happens when a fertilized egg grows outside the uterus. A pregnancy cannot live outside of the uterus. This problem often happens in the fallopian tube. It is often caused by damage to the fallopian tube. °If this problem is found early, you may be treated with medicine. If your tube tears or bursts open (ruptures), you will bleed inside. This is an emergency. You will need surgery. Get help right away. °What are the signs or symptoms? °You may have normal pregnancy symptoms at first. These include: °· Missing your period. °· Feeling sick to your stomach (nauseous). °· Being tired. °· Having tender breasts. ° °Then, you may start to have symptoms that are not normal. These include: °· Pain with sex (intercourse). °· Bleeding from the vagina. This includes light bleeding (spotting). °· Belly (abdomen) or lower belly cramping or pain. This may be felt on one side. °· A fast heartbeat (pulse). °· Passing out (fainting) after going poop (bowel movement). ° °If your tube tears, you may have symptoms such as: °· Really bad pain in the belly or lower belly. This happens suddenly. °· Dizziness. °· Passing out. °· Shoulder pain. ° °Get help right away if: °You have any of these symptoms. This is an emergency. °This information is not intended to replace advice given to you by your health care provider. Make sure you discuss any questions you have with your health care provider. °Document Released: 09/18/2008 Document Revised: 11/28/2015 Document Reviewed: 02/01/2013 °Elsevier Interactive Patient Education © 2017 Elsevier Inc. ° °

## 2017-09-24 NOTE — MAU Note (Signed)
Light spotting, no cramping, no pain.

## 2017-09-27 ENCOUNTER — Telehealth: Payer: Self-pay

## 2017-09-27 ENCOUNTER — Ambulatory Visit: Payer: Medicaid Other

## 2017-09-27 NOTE — Telephone Encounter (Signed)
Called pt in regards to missed appt.  Pt stated that she was waiting for someone to call her with the appt and never received it.  I ask pt to see if she can come today for her lab and that she will need to stay for 2 hours again to wait for the results.  Pt stated that due to her transportation if she is not able to come today then she will be able to come in the am tomorrow.  I strongly encouraged pt to come so that we can continue to f/u to make sure she does not have a ectopic pregnancy.  Pt stated understanding with no further questions.

## 2017-10-06 ENCOUNTER — Telehealth: Payer: Self-pay | Admitting: General Practice

## 2017-10-06 NOTE — Telephone Encounter (Signed)
Patient called and left message on voicemail line stating she has questions and would like a nurse to call her back. Called patient, no answer- left message stating we are trying to reach you to return your phone call, please call us back if you still need assistance

## 2017-10-18 ENCOUNTER — Encounter (HOSPITAL_COMMUNITY): Payer: Self-pay | Admitting: *Deleted

## 2017-10-18 ENCOUNTER — Inpatient Hospital Stay (HOSPITAL_COMMUNITY)
Admission: AD | Admit: 2017-10-18 | Discharge: 2017-10-18 | Disposition: A | Discharge status: Home / Self Care | Payer: Medicaid Other | Source: Ambulatory Visit | Attending: Obstetrics & Gynecology | Admitting: Obstetrics & Gynecology

## 2017-10-18 ENCOUNTER — Inpatient Hospital Stay (HOSPITAL_COMMUNITY): Payer: Medicaid Other

## 2017-10-18 DIAGNOSIS — Z833 Family history of diabetes mellitus: Secondary | ICD-10-CM | POA: Insufficient documentation

## 2017-10-18 DIAGNOSIS — O99341 Other mental disorders complicating pregnancy, first trimester: Secondary | ICD-10-CM | POA: Insufficient documentation

## 2017-10-18 DIAGNOSIS — Z3A09 9 weeks gestation of pregnancy: Secondary | ICD-10-CM | POA: Insufficient documentation

## 2017-10-18 DIAGNOSIS — Z8249 Family history of ischemic heart disease and other diseases of the circulatory system: Secondary | ICD-10-CM | POA: Insufficient documentation

## 2017-10-18 DIAGNOSIS — O99211 Obesity complicating pregnancy, first trimester: Secondary | ICD-10-CM | POA: Diagnosis not present

## 2017-10-18 DIAGNOSIS — O009 Unspecified ectopic pregnancy without intrauterine pregnancy: Secondary | ICD-10-CM | POA: Diagnosis present

## 2017-10-18 DIAGNOSIS — O24911 Unspecified diabetes mellitus in pregnancy, first trimester: Secondary | ICD-10-CM | POA: Insufficient documentation

## 2017-10-18 DIAGNOSIS — Z820 Family history of epilepsy and other diseases of the nervous system: Secondary | ICD-10-CM | POA: Insufficient documentation

## 2017-10-18 DIAGNOSIS — R1031 Right lower quadrant pain: Secondary | ICD-10-CM | POA: Diagnosis not present

## 2017-10-18 DIAGNOSIS — O26891 Other specified pregnancy related conditions, first trimester: Secondary | ICD-10-CM | POA: Insufficient documentation

## 2017-10-18 DIAGNOSIS — O00102 Left tubal pregnancy without intrauterine pregnancy: Secondary | ICD-10-CM

## 2017-10-18 DIAGNOSIS — Z9889 Other specified postprocedural states: Secondary | ICD-10-CM | POA: Diagnosis not present

## 2017-10-18 DIAGNOSIS — O3680X0 Pregnancy with inconclusive fetal viability, not applicable or unspecified: Secondary | ICD-10-CM

## 2017-10-18 DIAGNOSIS — O99281 Endocrine, nutritional and metabolic diseases complicating pregnancy, first trimester: Secondary | ICD-10-CM | POA: Diagnosis not present

## 2017-10-18 LAB — URINALYSIS, ROUTINE W REFLEX MICROSCOPIC
BILIRUBIN URINE: NEGATIVE
Glucose, UA: NEGATIVE mg/dL
Hgb urine dipstick: NEGATIVE
Ketones, ur: NEGATIVE mg/dL
NITRITE: NEGATIVE
PH: 7 (ref 5.0–8.0)
Protein, ur: NEGATIVE mg/dL
SPECIFIC GRAVITY, URINE: 1.01 (ref 1.005–1.030)

## 2017-10-18 LAB — AST: AST: 17 U/L (ref 15–41)

## 2017-10-18 LAB — CBC WITH DIFFERENTIAL/PLATELET
BASOS ABS: 0 10*3/uL (ref 0.0–0.1)
BASOS PCT: 0 %
EOS PCT: 1 %
Eosinophils Absolute: 0.1 10*3/uL (ref 0.0–0.7)
HCT: 37.6 % (ref 36.0–46.0)
Hemoglobin: 12.6 g/dL (ref 12.0–15.0)
Lymphocytes Relative: 27 %
Lymphs Abs: 3 10*3/uL (ref 0.7–4.0)
MCH: 26.4 pg (ref 26.0–34.0)
MCHC: 33.5 g/dL (ref 30.0–36.0)
MCV: 78.8 fL (ref 78.0–100.0)
MONO ABS: 0.7 10*3/uL (ref 0.1–1.0)
Monocytes Relative: 6 %
NEUTROS ABS: 7.4 10*3/uL (ref 1.7–7.7)
Neutrophils Relative %: 66 %
PLATELETS: 380 10*3/uL (ref 150–400)
RBC: 4.77 MIL/uL (ref 3.87–5.11)
RDW: 15.2 % (ref 11.5–15.5)
WBC: 11.2 10*3/uL — ABNORMAL HIGH (ref 4.0–10.5)

## 2017-10-18 LAB — CREATININE, SERUM
Creatinine, Ser: 0.84 mg/dL (ref 0.44–1.00)
GFR calc Af Amer: >60 mL/min (ref >60)

## 2017-10-18 LAB — HCG, QUANTITATIVE, PREGNANCY: hCG, Beta Chain, Quant, S: 1645 m[IU]/mL — ABNORMAL HIGH (ref <5)

## 2017-10-18 LAB — WET PREP, GENITAL
Clue Cells Wet Prep HPF POC: NONE SEEN
SPERM: NONE SEEN
TRICH WET PREP: NONE SEEN
YEAST WET PREP: NONE SEEN

## 2017-10-18 MED ORDER — METHOTREXATE INJECTION FOR WOMEN'S HOSPITAL
50.0000 mg/m2 | Freq: Once | INTRAMUSCULAR | Status: AC
  Administered 2017-10-18: 100 mg via INTRAMUSCULAR
  Filled 2017-10-18: qty 2

## 2017-10-18 NOTE — MAU Provider Note (Signed)
History     CSN: [id number]  Arrival date and time: 10/18/17 1146   First Provider Initiated Contact with Patient 10/18/17 1236      Chief Complaint  Patient presents with  . Abdominal Pain   HPI  Ms.  Deanna Walker is a 37 y.o. year old G14P0100 female at [redacted]w[redacted]d weeks gestation by LMP who presents to MAU reporting intermittent RLQ pain. She has been seen by Dr. Elesa Hacker for repeat [facility]. She reports her last HCG level was "2000 something". She was told by Dr. Meridee Score office to go to their [place] office for an [place] to R/O ectopic pregnancy on today. She arrived at her appt time only to be told the [place] tech is on vacation all week. She wants to know what is going on, if she has an ectopic pregnancy or if this is a normal pregnancy. She decided to come here to seen, because she "needs to know something now". She denies any VB at this time. She reports only having no bldg in x 2-3 wks; "tannish" vaginal d/c.  * Review of care everywhere: HCG (3/26) 2010, (3/28) 2345, (4/1) 2324, (4/9) 1752, (4/12) 1972  Past Medical History:  Diagnosis Date  . Abnormal Pap smear 2011   colpo and biopsy done, normal paps since  . Boil of buttock   . Boil, axilla   . Depression   . Diabetes mellitus    pre diabetes- using metformin to bring numbers down  . Gestational diabetes   . Infertility associated with anovulation 01/24/2013  . Obesity   . PCOS (polycystic ovarian syndrome)   . Perennial and seasonal allergic rhinoconjunctivitis 09/25/2015  . Short cervix in second trimester, antepartum 04/28/2016    Past Surgical History:  Procedure Laterality Date  . ABCESS DRAINAGE    . COLPOSCOPY      Family History  Problem Relation Age of Onset  . Diabetes Maternal Aunt   . Hypertension Maternal Aunt   . Seizures Mother   . Allergic rhinitis Neg Hx   . Angioedema Neg Hx   . Asthma Neg Hx   . Atopy Neg Hx   . Eczema Neg Hx   . Immunodeficiency Neg Hx   . Urticaria Neg Hx   . Cystic fibrosis Neg Hx    . Lupus Neg Hx   . Emphysema Neg Hx   . Migraines Neg Hx     Social History   Tobacco Use  . Smoking status: Never Smoker  . Smokeless tobacco: Never Used  Substance Use Topics  . Alcohol use: No    Alcohol/week: 0.0 oz    Comment: occasionally   . Drug use: No    Comment: past marijuana     Allergies: No Known Allergies  No medications prior to admission.    Review of Systems  Constitutional: Negative.   HENT: Negative.   Eyes: Negative.   Respiratory: Negative.   Cardiovascular: Negative.   Gastrointestinal: Negative.   Endocrine: Negative.   Genitourinary: Positive for vaginal discharge ("tannish"). Negative for pelvic pain and vaginal bleeding (in 2-3 wks).  Musculoskeletal: Negative.   Skin: Negative.   Allergic/Immunologic: Negative.   Neurological: Negative.   Hematological: Negative.   Psychiatric/Behavioral: Negative.    Physical Exam   Blood pressure 131/78, pulse 86, temperature 98.5 F (36.9 C), temperature source Oral, resp. rate 18, height 5\' 5"  (1.651 m), weight 197 lb 1.9 oz (89.4 kg), last menstrual period 08/11/2017.  Physical Exam  Nursing note and vitals reviewed.  Constitutional: She is oriented to person, place, and time. She appears well-developed and well-nourished.  HENT:  Head: Normocephalic and atraumatic.  Eyes: Pupils are equal, round, and reactive to light.  Neck: Normal range of motion. Neck supple.  Cardiovascular: Normal rate, regular rhythm and normal heart sounds.  Respiratory: Effort normal and breath sounds normal.  GI: Soft. Bowel sounds are normal.  Genitourinary:  Genitourinary Comments: Uterus: non-tender, SE: cervix is smooth, pink, no lesions, scant amt of tannish vaginal d/c -- WP, GC/CT done, closed/long/firm, no CMT or friability, no adnexal tenderness   Musculoskeletal: Normal range of motion.  Neurological: She is alert and oriented to person, place, and time.  Skin: Skin is warm and dry.  Psychiatric: She  has a normal mood and affect. Her behavior is normal. Judgment and thought content normal.    MAU Course  Procedures  MDM HCG CBC w/Diff AST  Creatinine OB < 14 wks TVUS MTX injection -- The risks of methotrexate were reviewed including failure requiring repeat dosing or eventual surgery. She understands that methotrexate involves frequent return visits to monitor lab values and that she remains at risk of ectopic rupture until her beta is less than assay. ?The patient opts to proceed with methotrexate.  She has no history of hepatic or renal dysfunction, has normal BUN/Cr/LFT's/platelets.  She is felt to be reliable for follow-up. Side effects of photosensitivity & GI upset were discussed.  She knows to avoid direct sunlight and abstain from alcohol, aspirin and aspirin-like products for two weeks. She was counseled to discontinue any MVI with folic acid. ?[facility] She understands to follow up in MAU on D4 (10/21/17) and D7 (10/24/2017) for repeat BHCG and was given the instruction sheet. Strict ectopic precautions were reviewed, the patient knows to call with any abdominal pain, vomiting, fainting, or any concerns with her health.  Rh+, no Rhogam necessary.    *Consult with Dr. Macon LargeAnyanwu @ 1500 - notified of patient's complaints, assessments, lab & U/S results, tx plan MTX - agrees with plan  Results for orders placed or performed during the hospital encounter of 10/18/17 (from the past 24 hour(s))  Urinalysis, Routine w reflex microscopic     Status: Abnormal   Collection Time: 10/18/17 12:10 PM  Result Value Ref Range   Color, Urine STRAW (A) YELLOW   APPearance CLEAR CLEAR   Specific Gravity, Urine 1.010 1.005 - 1.030   pH 7.0 5.0 - 8.0   Glucose, UA NEGATIVE NEGATIVE mg/dL   Hgb urine dipstick NEGATIVE NEGATIVE   Bilirubin Urine NEGATIVE NEGATIVE   Ketones, ur NEGATIVE NEGATIVE mg/dL   Protein, ur NEGATIVE NEGATIVE mg/dL   Nitrite NEGATIVE NEGATIVE    Leukocytes, UA TRACE (A) NEGATIVE   RBC / HPF 0-5 0 - 5 RBC/hpf   WBC, UA 0-5 0 - 5 WBC/hpf   Bacteria, UA RARE (A) NONE SEEN   Squamous Epithelial / LPF 0-5 (A) NONE SEEN  Wet prep, genital     Status: Abnormal   Collection Time: 10/18/17  1:11 PM  Result Value Ref Range   Yeast Wet Prep HPF POC NONE SEEN NONE SEEN   Trich, Wet Prep NONE SEEN NONE SEEN   Clue Cells Wet Prep HPF POC NONE SEEN NONE SEEN   WBC, Wet Prep HPF POC MANY (A) NONE SEEN   Sperm NONE SEEN   hCG, quantitative, pregnancy     Status: Abnormal   Collection Time: 10/18/17  1:18 PM  Result Value Ref Range   hCG, Beta Chain, Quant, S 1,645 (H) <5 mIU/mL  CBC WITH DIFFERENTIAL     Status: Abnormal   Collection Time: 10/18/17  3:22 PM  Result Value Ref Range   WBC 11.2 (H) 4.0 - 10.5 K/uL   RBC 4.77 3.87 - 5.11 MIL/uL   Hemoglobin 12.6 12.0 - 15.0 g/dL   HCT 16.1 09.6 - 04.5 %   MCV 78.8 78.0 - 100.0 fL   MCH 26.4 26.0 - 34.0 pg   MCHC 33.5 30.0 - 36.0 g/dL   RDW 40.9 81.1 - 91.4 %   Platelets 380 150 - 400 K/uL   Neutrophils Relative % 66 %   Neutro Abs 7.4 1.7 - 7.7 K/uL   Lymphocytes Relative 27 %   Lymphs Abs 3.0 0.7 - 4.0 K/uL   Monocytes Relative 6 %   Monocytes Absolute 0.7 0.1 - 1.0 K/uL   Eosinophils Relative 1 %   Eosinophils Absolute 0.1 0.0 - 0.7 K/uL   Basophils Relative 0 %   Basophils Absolute 0.0 0.0 - 0.1 K/uL  AST     Status: None   Collection Time: 10/18/17  3:22 PM  Result Value Ref Range   AST 17 15 - 41 U/L  Creatinine, serum     Status: None   Collection Time: 10/18/17  3:22 PM  Result Value Ref Range   Creatinine, Ser 0.84 0.44 - 1.00 mg/dL   GFR calc non Af Amer >60 >60 mL/min   GFR calc Af Amer >60 >60 mL/min     US Ob Comp Less 14 Wks  Result Date: 10/18/2017 CLINICAL DATA:  Right lower quadrant pain for 2 weeks. 1st trimester pregnancy of unknown anatomic location. EXAM: OBSTETRIC <14 WK [place] AND TRANSVAGINAL OB US TECHNIQUE: Both transabdominal and transvaginal  ultrasound examinations were performed for complete evaluation of the gestation as well as the maternal uterus, adnexal regions, and pelvic cul-de-sac. Transvaginal technique was performed to assess early pregnancy. COMPARISON:  09/22/2017 FINDINGS: Intrauterine gestational sac: None Maternal uterus/adnexae: Both ovaries are normal in appearance. A small hyperechoic mass with central cystic focus is seen in the left adnexa adjacent to the ovary which measures 1.7 x 1.6 x 1.7 cm. This is highly suspicious for small ectopic pregnancy. Tiny amount of simple free fluid noted in cul-de-sac. IMPRESSION: 1.7 cm left adnexal mass, suspicious for ectopic pregnancy. No evidence of hemoperitoneum. Critical Value/emergent results were called by telephone at the time of interpretation on 10/18/2017 at 2:45 pm to patient's provider Raelyn Mora , who verbally acknowledged these results. Electronically Signed   By: Myles Rosenthal M.D.   On: 10/18/2017 14:48   US Ob Transvaginal  Result Date: 10/18/2017 CLINICAL DATA:  Right lower quadrant pain for 2 weeks. 1st trimester pregnancy of unknown anatomic location. EXAM: OBSTETRIC <14 WK [place] AND TRANSVAGINAL OB US TECHNIQUE: Both transabdominal and transvaginal ultrasound examinations were performed for complete evaluation of the gestation as well as the maternal uterus, adnexal regions, and pelvic cul-de-sac. Transvaginal technique was performed to assess early pregnancy. COMPARISON:  09/22/2017 FINDINGS: Intrauterine gestational sac: None Maternal uterus/adnexae: Both ovaries are normal in appearance. A small hyperechoic mass with central cystic focus is seen in the left adnexa adjacent to the ovary which measures 1.7 x 1.6 x 1.7 cm. This is highly suspicious for small ectopic pregnancy. Tiny amount of simple free fluid noted in cul-de-sac. IMPRESSION: 1.7 cm left adnexal mass, suspicious for ectopic pregnancy. No evidence of hemoperitoneum. Critical Value/emergent  results were  called by telephone at the time of interpretation on 10/18/2017 at 2:45 pm to patient's provider Raelyn Mora , who verbally acknowledged these results. Electronically Signed   By: Myles Rosenthal M.D.   On: 10/18/2017 14:48    Assessment and Plan  Left tubal pregnancy without intrauterine pregnancy  - Information provided on ectopic pregnancy, MTX Advised as above in MDM on emergency  s/s of when to return to MAU  - F/U for repeat HCG on 4/18 & 4/21 in MAU; weekly F/U at [facility] -- msg sent to [facility] Admin pool - Discharge patient - Patient verbalized an understanding of the plan of care and agrees.    Raelyn Mora, MSN, CNM 10/19/2017, 12:36 PM

## 2017-10-18 NOTE — Discharge Instructions (Signed)
RETURN BACK TO MATERNITY ADMISSIONS UNIT ON Thursday 10/21/2017 AND Sunday 10/24/2017

## 2017-10-18 NOTE — MAU Note (Signed)
Pt C/O RLQ pain intermittently, has been followed for BHCG, was told she was going to miscarry, but states that has not happened.  Had spotting two weeks ago, no bleeding now, tan discharge.

## 2017-10-18 NOTE — MAU Note (Signed)
MTX information sheet given to pt. 

## 2017-10-19 ENCOUNTER — Telehealth: Payer: Self-pay | Admitting: General Practice

## 2017-10-19 LAB — GC/CHLAMYDIA PROBE AMP (~~LOC~~) NOT AT ARMC
CHLAMYDIA, DNA PROBE: NEGATIVE
NEISSERIA GONORRHEA: NEGATIVE

## 2017-10-19 NOTE — Telephone Encounter (Signed)
Called patient to schedule follow up HCG.  Left message on VM for patient to give our office a call to schedule.

## 2017-10-21 ENCOUNTER — Other Ambulatory Visit: Payer: Medicaid Other

## 2017-10-21 DIAGNOSIS — O3680X0 Pregnancy with inconclusive fetal viability, not applicable or unspecified: Secondary | ICD-10-CM

## 2017-10-21 LAB — HCG, QUANTITATIVE, PREGNANCY: HCG, BETA CHAIN, QUANT, S: 1267 m[IU]/mL — AB (ref <5)

## 2017-10-24 ENCOUNTER — Inpatient Hospital Stay (HOSPITAL_COMMUNITY)
Admission: AD | Admit: 2017-10-24 | Discharge: 2017-10-24 | Disposition: A | Discharge status: Home / Self Care | Payer: Medicaid Other | Source: Ambulatory Visit | Attending: Family Medicine | Admitting: Family Medicine

## 2017-10-24 DIAGNOSIS — Z5181 Encounter for therapeutic drug level monitoring: Secondary | ICD-10-CM | POA: Insufficient documentation

## 2017-10-24 DIAGNOSIS — O009 Unspecified ectopic pregnancy without intrauterine pregnancy: Secondary | ICD-10-CM | POA: Diagnosis not present

## 2017-10-24 DIAGNOSIS — Z79899 Other long term (current) drug therapy: Secondary | ICD-10-CM | POA: Insufficient documentation

## 2017-10-24 DIAGNOSIS — Z7984 Long term (current) use of oral hypoglycemic drugs: Secondary | ICD-10-CM | POA: Diagnosis not present

## 2017-10-24 LAB — HCG, QUANTITATIVE, PREGNANCY: hCG, Beta Chain, Quant, S: 873 m[IU]/mL — ABNORMAL HIGH (ref <5)

## 2017-10-24 NOTE — Discharge Instructions (Signed)
Ectopic Pregnancy °An ectopic pregnancy happens when a fertilized egg grows outside the uterus. A pregnancy cannot live outside of the uterus. This problem often happens in the fallopian tube. It is often caused by damage to the fallopian tube. °If this problem is found early, you may be treated with medicine. If your tube tears or bursts open (ruptures), you will bleed inside. This is an emergency. You will need surgery. Get help right away. °What are the signs or symptoms? °You may have normal pregnancy symptoms at first. These include: °· Missing your period. °· Feeling sick to your stomach (nauseous). °· Being tired. °· Having tender breasts. ° °Then, you may start to have symptoms that are not normal. These include: °· Pain with sex (intercourse). °· Bleeding from the vagina. This includes light bleeding (spotting). °· Belly (abdomen) or lower belly cramping or pain. This may be felt on one side. °· A fast heartbeat (pulse). °· Passing out (fainting) after going poop (bowel movement). ° °If your tube tears, you may have symptoms such as: °· Really bad pain in the belly or lower belly. This happens suddenly. °· Dizziness. °· Passing out. °· Shoulder pain. ° °Get help right away if: °You have any of these symptoms. This is an emergency. °This information is not intended to replace advice given to you by your health care provider. Make sure you discuss any questions you have with your health care provider. °Document Released: 09/18/2008 Document Revised: 11/28/2015 Document Reviewed: 02/01/2013 °Elsevier Interactive Patient Education © 2017 Elsevier Inc. ° °

## 2017-10-24 NOTE — MAU Note (Addendum)
Pt here for follow up Hcg. No other problems today.

## 2017-10-24 NOTE — MAU Provider Note (Signed)
History     CSN: [id number]  Arrival date and time: 10/24/17 1255   None     Chief Complaint  Patient presents with  . Follow-up  . Ectopic Pregnancy   Deanna Walker is a 37 y.o. G2P0100 who is here today for day #7 S/P MTX. She was given MTX on 10/18/17, and then was seen on 4/18 with hcg of 1267. She denies any pain   Past Medical History:  Diagnosis Date  . Abnormal Pap smear 2011   colpo and biopsy done, normal paps since  . Boil of buttock   . Boil, axilla   . Depression   . Diabetes mellitus    pre diabetes- using metformin to bring numbers down  . Gestational diabetes   . Infertility associated with anovulation 01/24/2013  . Obesity   . PCOS (polycystic ovarian syndrome)   . Perennial and seasonal allergic rhinoconjunctivitis 09/25/2015  . Short cervix in second trimester, antepartum 04/28/2016    Past Surgical History:  Procedure Laterality Date  . ABCESS DRAINAGE    . COLPOSCOPY      Family History  Problem Relation Age of Onset  . Diabetes Maternal Aunt   . Hypertension Maternal Aunt   . Seizures Mother   . Allergic rhinitis Neg Hx   . Angioedema Neg Hx   . Asthma Neg Hx   . Atopy Neg Hx   . Eczema Neg Hx   . Immunodeficiency Neg Hx   . Urticaria Neg Hx   . Cystic fibrosis Neg Hx   . Lupus Neg Hx   . Emphysema Neg Hx   . Migraines Neg Hx     Social History   Tobacco Use  . Smoking status: Never Smoker  . Smokeless tobacco: Never Used  Substance Use Topics  . Alcohol use: No    Alcohol/week: 0.0 oz    Comment: occasionally   . Drug use: No    Comment: past marijuana     Allergies: No Known Allergies  Medications Prior to Admission  Medication Sig Dispense Refill Last Dose  . cetirizine (ZYRTEC) 10 MG tablet   0 10/17/2017 at Unknown time  . metFORMIN (GLUCOPHAGE) 500 MG tablet Take 1 tablet (500 mg total) by mouth 2 (two) times daily with a meal. 60 tablet 12 Past Week at Unknown time  . Prenat-FeAsp-Meth-FA-DHA w/o A (PRENATE  PIXIE) 10-0.6-0.4-200 MG CAPS Take 1 tablet by mouth daily. (Patient not taking: Reported on 10/18/2017) 30 capsule 12 Not Taking at Unknown time  . Prenat-FeFmCb-DSS-FA-DHA w/o A (CITRANATAL HARMONY) 27-1-260 MG CAPS take 1 capsule by mouth once daily (Patient not taking: Reported on 10/18/2017) 30 capsule 12 Not Taking at Unknown time  . Prenatal-DSS-FeCb-FeGl-FA (CITRANATAL BLOOM) 90-1 MG TABS Take 1 tablet by mouth daily. (Patient not taking: Reported on 10/18/2017) 30 tablet 12 Not Taking at Unknown time    Review of Systems Physical Exam   Blood pressure (!) 139/99, pulse 97, resp. rate 16, weight 198 lb (89.8 kg), last menstrual period 08/11/2017, SpO2 97 %.  Physical Exam  Nursing note and vitals reviewed. Constitutional: She is oriented to person, place, and time. She appears well-developed and well-nourished. No distress.  HENT:  Head: Normocephalic.  Cardiovascular: Normal rate.  Respiratory: Effort normal.  GI: Soft. There is no tenderness. There is no rebound.  Neurological: She is alert and oriented to person, place, and time.  Skin: Skin is warm and dry.  Psychiatric: She has a normal mood and affect.  Results for Art BuffNDERSON, Demmi M (MRN [id number]) as of 10/24/2017 14:24  Ref. Range 10/18/2017 13:18   10/21/2017 09:08 10/24/2017 13:30  HCG, Beta Chain, Quant, S Latest Ref Range: <5 mIU/mL 1,645 (H)   1,267 (H) 873 (H)   MAU Course  Procedures  MDM 1430: DW Dr. Shawnie PonsPratt ok for weekly hcg as hcg has fallen by more than 15% from day #4 to day #7.   Assessment and Plan   1. Ectopic pregnancy without intrauterine pregnancy, unspecified location   2. Encounter for monitoring of methotrexate therapy    DC home Message sent to clinic to schedule one week HCG FU Patient will need weekly hcg.  Bleeding precautions Ectopic precautions  Follow-up Information    [facility]   Specialty:  Obstetrics and Gynecology Why:  They will call you  with an appointment to be seen for blood work on 11/01/17  Contact information: [address] [place] [id number] [phone]           Thressa ShellerHeather Hogan 10/24/2017, 2:25 PM

## 2017-11-01 ENCOUNTER — Telehealth: Payer: Self-pay | Admitting: General Practice

## 2017-11-01 ENCOUNTER — Other Ambulatory Visit: Payer: Medicaid Other

## 2017-11-01 DIAGNOSIS — Z5181 Encounter for therapeutic drug level monitoring: Secondary | ICD-10-CM

## 2017-11-01 DIAGNOSIS — Z79899 Other long term (current) drug therapy: Principal | ICD-10-CM

## 2017-11-01 NOTE — Telephone Encounter (Signed)
Patient called and left message on nurse voicemail requesting results from this morning. Called patient & explained her results will not be back until sometime tomorrow. Patient verbalized understanding & states so someone will call me tomorrow with the results. Told patient I couldn't promise it would be tomorrow but someone will contact her with results. Recommended she sign up for mychart. Patient verbalized understanding & had no questions

## 2017-11-02 ENCOUNTER — Telehealth: Payer: Self-pay | Admitting: General Practice

## 2017-11-02 DIAGNOSIS — O00109 Unspecified tubal pregnancy without intrauterine pregnancy: Secondary | ICD-10-CM

## 2017-11-02 LAB — BETA HCG QUANT (REF LAB): HCG QUANT: 189 m[IU]/mL

## 2017-11-02 NOTE — Telephone Encounter (Signed)
Patient called into office and left message on nurse voicemail line requesting results. Called patient & informed her of results and need for follow up bhcg in 1 week. Patient verbalized understanding and states she can come 5/8 @ 930. Patient asked how long the bleeding will last. Told patient it could be another week but it will start to slow down. Patient verbalized understanding & had no questions.

## 2017-11-10 ENCOUNTER — Other Ambulatory Visit: Payer: Medicaid Other

## 2017-11-10 DIAGNOSIS — O00109 Unspecified tubal pregnancy without intrauterine pregnancy: Secondary | ICD-10-CM

## 2017-11-11 LAB — BETA HCG QUANT (REF LAB): HCG QUANT: 38 m[IU]/mL

## 2017-11-12 ENCOUNTER — Telehealth: Payer: Self-pay

## 2017-11-12 NOTE — Telephone Encounter (Signed)
Returned call, pt wanted to schedule appt with provider to discuss medications. Notified scheduler to call pt for appt.

## 2017-11-15 ENCOUNTER — Telehealth: Payer: Self-pay | Admitting: *Deleted

## 2017-11-15 DIAGNOSIS — O00109 Unspecified tubal pregnancy without intrauterine pregnancy: Secondary | ICD-10-CM

## 2017-11-15 NOTE — Telephone Encounter (Signed)
Pt called for results from last BHCG on 11/10/17. Dx with ectopic preg on 10/18/17 and received MTX, Has kept appts for repeat BHCGs which have consistently come down. Advised last BHCG on 5/8 was 38. Pt had some brown spotting last few days and dull pain intermittently in LLQ. Tylenol relieved pain. Spoke with Thressa Sheller CNM and pt to repeat BHCG on 5/15. Pt aware to return 5/15 at 1400 for repeat BHCG. Pt also made aware will follow BHCGs down to 0 and agrees with plan of care

## 2017-11-16 ENCOUNTER — Telehealth: Payer: Self-pay

## 2017-11-16 NOTE — Telephone Encounter (Signed)
Pt called in regards getting test results. Called pt and pt informed me that she has already received the results and is coming in for rpt beta lab draw tomorrow 11/17/17.  Pt did not have any other questions.

## 2017-11-17 ENCOUNTER — Other Ambulatory Visit: Payer: Medicaid Other

## 2017-11-19 ENCOUNTER — Other Ambulatory Visit: Payer: Medicaid Other

## 2017-11-19 ENCOUNTER — Other Ambulatory Visit: Payer: Self-pay | Admitting: *Deleted

## 2017-11-19 DIAGNOSIS — O00109 Unspecified tubal pregnancy without intrauterine pregnancy: Secondary | ICD-10-CM

## 2017-11-20 LAB — BETA HCG QUANT (REF LAB): HCG QUANT: 1 m[IU]/mL

## 2017-11-22 ENCOUNTER — Telehealth: Payer: Self-pay | Admitting: General Practice

## 2017-11-22 ENCOUNTER — Encounter: Payer: Self-pay | Admitting: General Practice

## 2017-11-22 NOTE — Telephone Encounter (Signed)
Patient called into front office requesting test results. Informed patient of bhcg results and no need for further follow up. Patient verbalized understanding & asked about getting pregnant in the future. Recommended she wait 2-3 normal menstrual cycles before trying to conceive again. Also recommended she continue PNV. Patient verbalized understanding and asked when she should expect a period. Patient reports recent cramping similar to a period. Discussed it will likely be in the next couple of weeks but if she hasn't had a period by the end of June to give [place] a call. Patient verbalized understanding and asked about progesterone pills. Told patient those usually aren't recommended for patients. Patient verbalized understanding & had no questions.

## 2018-04-07 ENCOUNTER — Ambulatory Visit (HOSPITAL_COMMUNITY): Admission: RE | Admit: 2018-04-07 | Payer: Medicaid Other | Source: Ambulatory Visit

## 2018-04-26 ENCOUNTER — Other Ambulatory Visit (HOSPITAL_COMMUNITY): Payer: Self-pay | Admitting: Obstetrics and Gynecology

## 2018-04-26 DIAGNOSIS — Z8759 Personal history of other complications of pregnancy, childbirth and the puerperium: Secondary | ICD-10-CM

## 2018-04-29 ENCOUNTER — Ambulatory Visit (HOSPITAL_COMMUNITY)
Admission: RE | Admit: 2018-04-29 | Discharge: 2018-04-29 | Disposition: A | Discharge status: Home / Self Care | Payer: Medicaid Other | Source: Ambulatory Visit | Attending: Obstetrics and Gynecology | Admitting: Obstetrics and Gynecology

## 2018-04-29 DIAGNOSIS — Z8759 Personal history of other complications of pregnancy, childbirth and the puerperium: Secondary | ICD-10-CM | POA: Diagnosis present

## 2018-04-29 DIAGNOSIS — N971 Female infertility of tubal origin: Secondary | ICD-10-CM | POA: Diagnosis not present

## 2018-04-29 MED ORDER — IOPAMIDOL (ISOVUE-300) INJECTION 61%
30.0000 mL | Freq: Once | INTRAVENOUS | Status: AC | PRN
  Administered 2018-04-29: 10 mL

## 2018-06-10 ENCOUNTER — Encounter (HOSPITAL_COMMUNITY): Payer: Self-pay

## 2018-06-10 ENCOUNTER — Inpatient Hospital Stay (HOSPITAL_COMMUNITY): Payer: Medicaid Other

## 2018-06-10 ENCOUNTER — Inpatient Hospital Stay (HOSPITAL_COMMUNITY)
Admission: AD | Admit: 2018-06-10 | Discharge: 2018-06-10 | Disposition: A | Discharge status: Home / Self Care | Payer: Medicaid Other | Attending: Obstetrics and Gynecology | Admitting: Obstetrics and Gynecology

## 2018-06-10 DIAGNOSIS — O418X10 Other specified disorders of amniotic fluid and membranes, first trimester, not applicable or unspecified: Secondary | ICD-10-CM

## 2018-06-10 DIAGNOSIS — O26891 Other specified pregnancy related conditions, first trimester: Secondary | ICD-10-CM

## 2018-06-10 DIAGNOSIS — O208 Other hemorrhage in early pregnancy: Secondary | ICD-10-CM | POA: Insufficient documentation

## 2018-06-10 DIAGNOSIS — O4690 Antepartum hemorrhage, unspecified, unspecified trimester: Secondary | ICD-10-CM

## 2018-06-10 DIAGNOSIS — Z3A01 Less than 8 weeks gestation of pregnancy: Secondary | ICD-10-CM | POA: Insufficient documentation

## 2018-06-10 DIAGNOSIS — O209 Hemorrhage in early pregnancy, unspecified: Secondary | ICD-10-CM | POA: Diagnosis present

## 2018-06-10 DIAGNOSIS — O468X1 Other antepartum hemorrhage, first trimester: Secondary | ICD-10-CM | POA: Diagnosis not present

## 2018-06-10 DIAGNOSIS — Z3491 Encounter for supervision of normal pregnancy, unspecified, first trimester: Secondary | ICD-10-CM

## 2018-06-10 LAB — URINALYSIS, ROUTINE W REFLEX MICROSCOPIC
Bilirubin Urine: NEGATIVE
Glucose, UA: NEGATIVE mg/dL
Hgb urine dipstick: NEGATIVE
Ketones, ur: NEGATIVE mg/dL
Leukocytes, UA: NEGATIVE
Nitrite: NEGATIVE
Protein, ur: NEGATIVE mg/dL
Specific Gravity, Urine: 1.015 (ref 1.005–1.030)
pH: 6 (ref 5.0–8.0)

## 2018-06-10 LAB — CBC
HCT: 39.4 % (ref 36.0–46.0)
Hemoglobin: 13 g/dL (ref 12.0–15.0)
MCH: 26.4 pg (ref 26.0–34.0)
MCHC: 33 g/dL (ref 30.0–36.0)
MCV: 79.9 fL — ABNORMAL LOW (ref 80.0–100.0)
Platelets: 377 10*3/uL (ref 150–400)
RBC: 4.93 MIL/uL (ref 3.87–5.11)
RDW: 14.5 % (ref 11.5–15.5)
WBC: 11.5 10*3/uL — ABNORMAL HIGH (ref 4.0–10.5)
nRBC: 0 % (ref 0.0–0.2)

## 2018-06-10 LAB — WET PREP, GENITAL
Clue Cells Wet Prep HPF POC: NONE SEEN
Sperm: NONE SEEN
Trich, Wet Prep: NONE SEEN
Yeast Wet Prep HPF POC: NONE SEEN

## 2018-06-10 LAB — POCT PREGNANCY, URINE: PREG TEST UR: POSITIVE — AB

## 2018-06-10 LAB — HCG, QUANTITATIVE, PREGNANCY: hCG, Beta Chain, Quant, S: 29199 m[IU]/mL — ABNORMAL HIGH (ref <5)

## 2018-06-10 NOTE — Discharge Instructions (Signed)
[facility] Prov[facility]  ° ° °[facility]       Phone: [phone] ° °[facility] Phone: [phone] ° °[facility] [place]  Phone: [phone] ° °[facility] [place]  Phone: [phone] ° °[facility] [place]  Phone: [phone] ° °[facility]       Phone: [phone] ° °[facility]    Phone: [phone]  ° °[facility]    Phone: [phone] ° °[facility]    Phone: [phone] ° °[facility]    Phone: [phone] ° °[facility]    Phone: [phone] ° °[facility]       Phone: [phone]  ° °[facility]  Phone: [phone] ° °[facility]    Phone: [phone] ° °[facility]   Phone: [phone] ° °[facility]      Phone: [phone] ° °[facility]    Phone: [phone] ° °Safe Medications in Pregnancy  ° °Acne: °Benzoyl Peroxide °Salicylic Acid ° °Backache/Headache: °Tylenol: 2 regular strength every 4 hours OR °             2 Extra strength every 6 hours ° °Colds/Coughs/Allergies: °Benadryl (alcohol free) 25 mg every 6 hours as needed °Breath right strips °Claritin °Cepacol throat lozenges °Chloraseptic throat spray °Cold-Eeze- up to three times per day °Cough drops, alcohol free °Flonase (by prescription only) °Guaifenesin °Mucinex °Robitussin DM (plain only, alcohol free) °Saline nasal spray/drops °Sudafed (pseudoephedrine) & Actifed ** use only after [redacted] weeks gestation and if you do not have high blood pressure °Tylenol °Vicks Vaporub °Zinc lozenges °Zyrtec  ° °Constipation: °Colace °Ducolax suppositories °Fleet enema °Glycerin suppositories °Metamucil °Milk of  magnesia °Miralax °Senokot °Smooth move tea ° °Diarrhea: °Kaopectate °Imodium A-D ° °*NO pepto Bismol ° °Hemorrhoids: °Anusol °Anusol HC °Preparation H °Tucks ° °Indigestion: °Tums °Maalox °Mylanta °Zantac  °Pepcid ° °Insomnia: °Benadryl (alcohol free) 25mg every 6 hours as needed °Tylenol PM °Unisom, no Gelcaps ° °Leg Cramps: °Tums °MagGel ° °Nausea/Vomiting:  °Bonine °Dramamine °Emetrol °Ginger extract °Sea bands °Meclizine  °Nausea medication to take during pregnancy:  °Unisom (doxylamine succinate 25 mg tablets) Take one tablet daily at bedtime. If symptoms are not adequately controlled, the dose can be increased to a maximum recommended dose of two tablets daily (1/2 tablet in the morning, 1/2 tablet mid-afternoon and one at bedtime). °Vitamin B6 100mg tablets. Take one tablet twice a day (up to 200 mg per day). ° °Skin Rashes: °Aveeno products °Benadryl cream or 25mg every 6 hours as needed °Calamine Lotion °1% cortisone cream ° °Yeast infection: °Gyne-lotrimin 7 °Monistat 7 ° ° °**If taking multiple medications, please check labels to avoid duplicating the same active ingredients °**take medication as directed on the label °** Do not exceed 4000 mg of tylenol in 24 hours °**Do not take medications that contain aspirin or ibuprofen ° ° ° ° °

## 2018-06-10 NOTE — MAU Note (Signed)
Stated she noticed some very light pink spotting today after having a BM-fairly sure it was vaginal.  No bleeding since (used BR X2 since).  No abdominal pain.

## 2018-06-10 NOTE — Progress Notes (Addendum)
Cleone Slimaroline Neill CNM in earlier to discuss test results and d/c plan. Written and verbal d/c instructions given and understanding voiced. Hardcopy d/c papers signed - esig not working in room

## 2018-06-10 NOTE — MAU Provider Note (Signed)
History     CSN: [id number]  Arrival date and time: 06/10/18 7829   First Provider Initiated Contact with Patient 06/10/18 2039      Chief Complaint  Patient presents with  . Vaginal Bleeding   HPI Deanna Walker is a 37 y.o. G3P0100 at [redacted]w[redacted]d who presents with vaginal bleeding. She states she strained while having a bowel movement today and saw blood when she wiped. She also had intercourse today before the bleeding started. She denies any pain. Denies any other abnormal discharge.   OB History    Gravida  3   Para  1   Term      Preterm  1   AB      Living  0     SAB      TAB      Ectopic      Multiple      Live Births  1           Past Medical History:  Diagnosis Date  . Abnormal Pap smear 2011   colpo and biopsy done, normal paps since  . Boil of buttock   . Boil, axilla   . Depression   . Diabetes mellitus    pre diabetes- using metformin to bring numbers down  . Gestational diabetes   . Infertility associated with anovulation 01/24/2013  . Obesity   . PCOS (polycystic ovarian syndrome)   . Perennial and seasonal allergic rhinoconjunctivitis 09/25/2015  . Short cervix in second trimester, antepartum 04/28/2016    Past Surgical History:  Procedure Laterality Date  . ABCESS DRAINAGE    . COLPOSCOPY      Family History  Problem Relation Age of Onset  . Diabetes Maternal Aunt   . Hypertension Maternal Aunt   . Seizures Mother   . Allergic rhinitis Neg Hx   . Angioedema Neg Hx   . Asthma Neg Hx   . Atopy Neg Hx   . Eczema Neg Hx   . Immunodeficiency Neg Hx   . Urticaria Neg Hx   . Cystic fibrosis Neg Hx   . Lupus Neg Hx   . Emphysema Neg Hx   . Migraines Neg Hx     Social History   Tobacco Use  . Smoking status: Never Smoker  . Smokeless tobacco: Never Used  Substance Use Topics  . Alcohol use: No    Alcohol/week: 0.0 standard drinks    Comment: occasionally   . Drug use: No    Comment: past marijuana     Allergies:  No Known Allergies  Medications Prior to Admission  Medication Sig Dispense Refill Last Dose  . cetirizine (ZYRTEC) 10 MG tablet   0 Past Month at Unknown time  . metFORMIN (GLUCOPHAGE) 500 MG tablet Take 1 tablet (500 mg total) by mouth 2 (two) times daily with a meal. 60 tablet 12 06/10/2018 at Unknown time  . Prenat-FeAsp-Meth-FA-DHA w/o A (PRENATE PIXIE) 10-0.6-0.4-200 MG CAPS Take 1 tablet by mouth daily. 30 capsule 12 06/10/2018 at Unknown time  . vitamin E 100 UNIT capsule Take by mouth daily.   06/10/2018 at Unknown time  . Prenat-FeFmCb-DSS-FA-DHA w/o A (CITRANATAL HARMONY) 27-1-260 MG CAPS take 1 capsule by mouth once daily (Patient not taking: Reported on 10/18/2017) 30 capsule 12 Not Taking at Unknown time  . Prenatal-DSS-FeCb-FeGl-FA (CITRANATAL BLOOM) 90-1 MG TABS Take 1 tablet by mouth daily. (Patient not taking: Reported on 10/18/2017) 30 tablet 12 Not Taking at Unknown time    Review  of Systems  Constitutional: Negative.  Negative for fatigue and fever.  HENT: Negative.   Respiratory: Negative.  Negative for shortness of breath.   Cardiovascular: Negative.  Negative for chest pain.  Gastrointestinal: Negative.  Negative for abdominal pain, constipation, diarrhea, nausea and vomiting.  Genitourinary: Positive for vaginal bleeding. Negative for dysuria and vaginal discharge.  Neurological: Negative.  Negative for dizziness and headaches.   Physical Exam   Blood pressure (!) 147/85, pulse (!) 104, temperature 99 F (37.2 C), resp. rate 19, height 5\' 5"  (1.651 m), weight 88.2 kg, last menstrual period 04/22/2018, unknown if currently breastfeeding.  Physical Exam  Nursing note and vitals reviewed. Constitutional: She is oriented to person, place, and time. She appears well-developed and well-nourished. No distress.  HENT:  Head: Normocephalic.  Eyes: Pupils are equal, round, and reactive to light.  Cardiovascular: Normal rate, regular rhythm and normal heart sounds.   Respiratory: Effort normal and breath sounds normal. No respiratory distress.  GI: Soft. Bowel sounds are normal. She exhibits no distension. There is no tenderness.  Genitourinary: Cervix exhibits friability.  Genitourinary Comments: Pelvic exam: Cervix pink, visually closed, without lesion, scant brown creamy discharge, vaginal walls and external genitalia normal Bimanual exam: Cervix 0/long/high, firm, anterior, neg CMT, uterus nontender, nonenlarged, adnexa without tenderness, enlargement, or mass   Neurological: She is alert and oriented to person, place, and time.  Skin: Skin is warm and dry.  Psychiatric: She has a normal mood and affect. Her behavior is normal. Judgment and thought content normal.     MAU Course  Procedures Results for orders placed or performed during the hospital encounter of 06/10/18 (from the past 24 hour(s))  Urinalysis, Routine w reflex microscopic     Status: None   Collection Time: 06/10/18  7:50 PM  Result Value Ref Range   Color, Urine YELLOW YELLOW   APPearance CLEAR CLEAR   Specific Gravity, Urine 1.015 1.005 - 1.030   pH 6.0 5.0 - 8.0   Glucose, UA NEGATIVE NEGATIVE mg/dL   Hgb urine dipstick NEGATIVE NEGATIVE   Bilirubin Urine NEGATIVE NEGATIVE   Ketones, ur NEGATIVE NEGATIVE mg/dL   Protein, ur NEGATIVE NEGATIVE mg/dL   Nitrite NEGATIVE NEGATIVE   Leukocytes, UA NEGATIVE NEGATIVE  Pregnancy, urine POC     Status: Abnormal   Collection Time: 06/10/18  8:05 PM  Result Value Ref Range   Preg Test, Ur POSITIVE (A) NEGATIVE  CBC     Status: Abnormal   Collection Time: 06/10/18  8:24 PM  Result Value Ref Range   WBC 11.5 (H) 4.0 - 10.5 K/uL   RBC 4.93 3.87 - 5.11 MIL/uL   Hemoglobin 13.0 12.0 - 15.0 g/dL   HCT 57.839.4 46.936.0 - 62.946.0 %   MCV 79.9 (L) 80.0 - 100.0 fL   MCH 26.4 26.0 - 34.0 pg   MCHC 33.0 30.0 - 36.0 g/dL   RDW 52.814.5 41.311.5 - 24.415.5 %   Platelets 377 150 - 400 K/uL   nRBC 0.0 0.0 - 0.2 %  hCG, quantitative, pregnancy     Status:  Abnormal   Collection Time: 06/10/18  8:24 PM  Result Value Ref Range   hCG, Beta Chain, Quant, S 29,199 (H) <5 mIU/mL  Wet prep, genital     Status: Abnormal   Collection Time: 06/10/18  8:47 PM  Result Value Ref Range   Yeast Wet Prep HPF POC NONE SEEN NONE SEEN   Trich, Wet Prep NONE SEEN NONE SEEN   Clue Cells  Wet Prep HPF POC NONE SEEN NONE SEEN   WBC, Wet Prep HPF POC MANY (A) NONE SEEN   Sperm NONE SEEN    US Ob Less Than 14 Weeks With Ob Transvaginal  Result Date: 06/10/2018 CLINICAL DATA:  Vaginal spotting. Gestational age by last menstrual period 7 weeks and 0 days. EXAM: OBSTETRIC <14 WK [place] AND TRANSVAGINAL OB US TECHNIQUE: Both transabdominal and transvaginal ultrasound examinations were performed for complete evaluation of the gestation as well as the maternal uterus, adnexal regions, and pelvic cul-de-sac. Transvaginal technique was performed to assess early pregnancy. COMPARISON:  None. FINDINGS: Intrauterine gestational sac: Present. Yolk sac:  Present. Embryo:  Present. Cardiac Activity: Present. Heart Rate: 129 bpm CRL:  6.5 mm   6 w   3 d                  [place] EDC: January 31, 2019 Subchorionic hemorrhage:  Small Maternal uterus/adnexae: Normal appearance of the adnexa. RIGHT intra-ovarian corpus luteal cyst. No free fluid. IMPRESSION: 1. Single live intrauterine pregnancy, gestational age by ultrasound 6 weeks and 3 days. Small subchorionic hemorrhage. Electronically Signed   By: Awilda Metro M.D.   On: 06/10/2018 21:35   MDM UA, UPT CBC, HCG ABO/Rh- B Pos Wet prep and gc/chlamydia US OB Comp Less 14 weeks with Transvaginal  Assessment and Plan   1. Normal intrauterine pregnancy on prenatal ultrasound in first trimester   2. Vaginal bleeding during pregnancy   3. [redacted] weeks gestation of pregnancy   4. Subchorionic hemorrhage of placenta in first trimester, single or unspecified fetus    -Discharge home in stable condition -Vaginal bleeding precautions  discussed -Patient advised to follow-up with OB of choice to start prenatal care -Patient may return to MAU as needed or if her condition were to change or worsen  Rolm Bookbinder CNM 06/10/2018, 9:43 PM

## 2018-06-10 NOTE — MAU Note (Signed)
Urine in lab 

## 2018-06-13 LAB — GC/CHLAMYDIA PROBE AMP (~~LOC~~) NOT AT ARMC
Chlamydia: NEGATIVE
Neisseria Gonorrhea: NEGATIVE

## 2018-06-20 ENCOUNTER — Encounter: Payer: Self-pay | Admitting: Obstetrics and Gynecology

## 2018-06-20 ENCOUNTER — Ambulatory Visit (INDEPENDENT_AMBULATORY_CARE_PROVIDER_SITE_OTHER): Payer: Medicaid Other | Admitting: Obstetrics and Gynecology

## 2018-06-20 ENCOUNTER — Encounter: Payer: Self-pay | Admitting: *Deleted

## 2018-06-20 ENCOUNTER — Other Ambulatory Visit (HOSPITAL_COMMUNITY)
Admission: RE | Admit: 2018-06-20 | Discharge: 2018-06-20 | Disposition: A | Discharge status: Home / Self Care | Payer: Medicaid Other | Source: Ambulatory Visit | Attending: Obstetrics and Gynecology | Admitting: Obstetrics and Gynecology

## 2018-06-20 DIAGNOSIS — O0990 Supervision of high risk pregnancy, unspecified, unspecified trimester: Secondary | ICD-10-CM | POA: Insufficient documentation

## 2018-06-20 DIAGNOSIS — O10011 Pre-existing essential hypertension complicating pregnancy, first trimester: Secondary | ICD-10-CM | POA: Diagnosis not present

## 2018-06-20 DIAGNOSIS — O09291 Supervision of pregnancy with other poor reproductive or obstetric history, first trimester: Secondary | ICD-10-CM

## 2018-06-20 DIAGNOSIS — O09519 Supervision of elderly primigravida, unspecified trimester: Secondary | ICD-10-CM | POA: Insufficient documentation

## 2018-06-20 DIAGNOSIS — O0991 Supervision of high risk pregnancy, unspecified, first trimester: Secondary | ICD-10-CM

## 2018-06-20 DIAGNOSIS — Z8632 Personal history of gestational diabetes: Principal | ICD-10-CM

## 2018-06-20 DIAGNOSIS — O09299 Supervision of pregnancy with other poor reproductive or obstetric history, unspecified trimester: Secondary | ICD-10-CM | POA: Insufficient documentation

## 2018-06-20 DIAGNOSIS — I10 Essential (primary) hypertension: Secondary | ICD-10-CM

## 2018-06-20 DIAGNOSIS — O09511 Supervision of elderly primigravida, first trimester: Secondary | ICD-10-CM

## 2018-06-20 NOTE — Progress Notes (Signed)
Patient is in the office for initial ob visit. Pt denies any pain today, reports brownish discharge since MAU visit on 12/6. FOB is involved, they are not living together.

## 2018-06-20 NOTE — Patient Instructions (Signed)
 First Trimester of Pregnancy The first trimester of pregnancy is from week 1 until the end of week 13 (months 1 through 3). A week after a sperm fertilizes an egg, the egg will implant on the wall of the uterus. This embryo will begin to develop into a baby. Genes from you and your partner will form the baby. The female genes will determine whether the baby will be a boy or a girl. At 6-8 weeks, the eyes and face will be formed, and the heartbeat can be seen on ultrasound. At the end of 12 weeks, all the baby's organs will be formed. Now that you are pregnant, you will want to do everything you can to have a healthy baby. Two of the most important things are to get good prenatal care and to follow your health care provider's instructions. Prenatal care is all the medical care you receive before the baby's birth. This care will help prevent, find, and treat any problems during the pregnancy and childbirth. Body changes during your first trimester Your body goes through many changes during pregnancy. The changes vary from woman to woman.  You may gain or lose a couple of pounds at first.  You may feel sick to your stomach (nauseous) and you may throw up (vomit). If the vomiting is uncontrollable, call your health care provider.  You may tire easily.  You may develop headaches that can be relieved by medicines. All medicines should be approved by your health care provider.  You may urinate more often. Painful urination may mean you have a bladder infection.  You may develop heartburn as a result of your pregnancy.  You may develop constipation because certain hormones are causing the muscles that push stool through your intestines to slow down.  You may develop hemorrhoids or swollen veins (varicose veins).  Your breasts may begin to grow larger and become tender. Your nipples may stick out more, and the tissue that surrounds them (areola) may become darker.  Your gums may bleed and may be  sensitive to brushing and flossing.  Dark spots or blotches (chloasma, mask of pregnancy) may develop on your face. This will likely fade after the baby is born.  Your menstrual periods will stop.  You may have a loss of appetite.  You may develop cravings for certain kinds of food.  You may have changes in your emotions from day to day, such as being excited to be pregnant or being concerned that something may go wrong with the pregnancy and baby.  You may have more vivid and strange dreams.  You may have changes in your hair. These can include thickening of your hair, rapid growth, and changes in texture. Some women also have hair loss during or after pregnancy, or hair that feels dry or thin. Your hair will most likely return to normal after your baby is born.  What to expect at prenatal visits During a routine prenatal visit:  You will be weighed to make sure you and the baby are growing normally.  Your blood pressure will be taken.  Your abdomen will be measured to track your baby's growth.  The fetal heartbeat will be listened to between weeks 10 and 14 of your pregnancy.  Test results from any previous visits will be discussed.  Your health care provider may ask you:  How you are feeling.  If you are feeling the baby move.  If you have had any abnormal symptoms, such as leaking fluid, bleeding, severe   headaches, or abdominal cramping.  If you are using any tobacco products, including cigarettes, chewing tobacco, and electronic cigarettes.  If you have any questions.  Other tests that may be performed during your first trimester include:  Blood tests to find your blood type and to check for the presence of any previous infections. The tests will also be used to check for low iron levels (anemia) and protein on red blood cells (Rh antibodies). Depending on your risk factors, or if you previously had diabetes during pregnancy, you may have tests to check for high blood  sugar that affects pregnant women (gestational diabetes).  Urine tests to check for infections, diabetes, or protein in the urine.  An ultrasound to confirm the proper growth and development of the baby.  Fetal screens for spinal cord problems (spina bifida) and Down syndrome.  HIV (human immunodeficiency virus) testing. Routine prenatal testing includes screening for HIV, unless you choose not to have this test.  You may need other tests to make sure you and the baby are doing well.  Follow these instructions at home: Medicines  Follow your health care provider's instructions regarding medicine use. Specific medicines may be either safe or unsafe to take during pregnancy.  Take a prenatal vitamin that contains at least 600 micrograms (mcg) of folic acid.  If you develop constipation, try taking a stool softener if your health care provider approves. Eating and drinking  Eat a balanced diet that includes fresh fruits and vegetables, whole grains, good sources of protein such as meat, eggs, or tofu, and low-fat dairy. Your health care provider will help you determine the amount of weight gain that is right for you.  Avoid raw meat and uncooked cheese. These carry germs that can cause birth defects in the baby.  Eating four or five small meals rather than three large meals a day may help relieve nausea and vomiting. If you start to feel nauseous, eating a few soda crackers can be helpful. Drinking liquids between meals, instead of during meals, also seems to help ease nausea and vomiting.  Limit foods that are high in fat and processed sugars, such as fried and sweet foods.  To prevent constipation: ? Eat foods that are high in fiber, such as fresh fruits and vegetables, whole grains, and beans. ? Drink enough fluid to keep your urine clear or pale yellow. Activity  Exercise only as directed by your health care provider. Most women can continue their usual exercise routine during  pregnancy. Try to exercise for 30 minutes at least 5 days a week. Exercising will help you: ? Control your weight. ? Stay in shape. ? Be prepared for labor and delivery.  Experiencing pain or cramping in the lower abdomen or lower back is a good sign that you should stop exercising. Check with your health care provider before continuing with normal exercises.  Try to avoid standing for long periods of time. Move your legs often if you must stand in one place for a long time.  Avoid heavy lifting.  Wear low-heeled shoes and practice good posture.  You may continue to have sex unless your health care provider tells you not to. Relieving pain and discomfort  Wear a good support bra to relieve breast tenderness.  Take warm sitz baths to soothe any pain or discomfort caused by hemorrhoids. Use hemorrhoid cream if your health care provider approves.  Rest with your legs elevated if you have leg cramps or low back pain.  If you   develop varicose veins in your legs, wear support hose. Elevate your feet for 15 minutes, 3-4 times a day. Limit salt in your diet. Prenatal care  Schedule your prenatal visits by the twelfth week of pregnancy. They are usually scheduled monthly at first, then more often in the last 2 months before delivery.  Write down your questions. Take them to your prenatal visits.  Keep all your prenatal visits as told by your health care provider. This is important. Safety  Wear your seat belt at all times when driving.  Make a list of emergency phone numbers, including numbers for family, friends, the hospital, and police and fire departments. General instructions  Ask your health care provider for a referral to a local prenatal education class. Begin classes no later than the beginning of month 6 of your pregnancy.  Ask for help if you have counseling or nutritional needs during pregnancy. Your health care provider can offer advice or refer you to specialists for help  with various needs.  Do not use hot tubs, steam rooms, or saunas.  Do not douche or use tampons or scented sanitary pads.  Do not cross your legs for long periods of time.  Avoid cat litter boxes and soil used by cats. These carry germs that can cause birth defects in the baby and possibly loss of the fetus by miscarriage or stillbirth.  Avoid all smoking, herbs, alcohol, and medicines not prescribed by your health care provider. Chemicals in these products affect the formation and growth of the baby.  Do not use any products that contain nicotine or tobacco, such as cigarettes and e-cigarettes. If you need help quitting, ask your health care provider. You may receive counseling support and other resources to help you quit.  Schedule a dentist appointment. At home, brush your teeth with a soft toothbrush and be gentle when you floss. Contact a health care provider if:  You have dizziness.  You have mild pelvic cramps, pelvic pressure, or nagging pain in the abdominal area.  You have persistent nausea, vomiting, or diarrhea.  You have a bad smelling vaginal discharge.  You have pain when you urinate.  You notice increased swelling in your face, hands, legs, or ankles.  You are exposed to fifth disease or chickenpox.  You are exposed to German measles (rubella) and have never had it. Get help right away if:  You have a fever.  You are leaking fluid from your vagina.  You have spotting or bleeding from your vagina.  You have severe abdominal cramping or pain.  You have rapid weight gain or loss.  You vomit blood or material that looks like coffee grounds.  You develop a severe headache.  You have shortness of breath.  You have any kind of trauma, such as from a fall or a car accident. Summary  The first trimester of pregnancy is from week 1 until the end of week 13 (months 1 through 3).  Your body goes through many changes during pregnancy. The changes vary from  woman to woman.  You will have routine prenatal visits. During those visits, your health care provider will examine you, discuss any test results you may have, and talk with you about how you are feeling. This information is not intended to replace advice given to you by your health care provider. Make sure you discuss any questions you have with your health care provider. Document Released: 06/16/2001 Document Revised: 06/03/2016 Document Reviewed: 06/03/2016 [employer] Interactive Patient Education  2018   [employer]   Second Trimester of Pregnancy The second trimester is from week 14 through week 27 (months 4 through 6). The second trimester is often a time when you feel your best. Your body has adjusted to being pregnant, and you begin to feel better physically. Usually, morning sickness has lessened or quit completely, you may have more energy, and you may have an increase in appetite. The second trimester is also a time when the fetus is growing rapidly. At the end of the sixth month, the fetus is about 9 inches long and weighs about 1 pounds. You will likely begin to feel the baby move (quickening) between 16 and 20 weeks of pregnancy. Body changes during your second trimester Your body continues to go through many changes during your second trimester. The changes vary from woman to woman.  Your weight will continue to increase. You will notice your lower abdomen bulging out.  You may begin to get stretch marks on your hips, abdomen, and breasts.  You may develop headaches that can be relieved by medicines. The medicines should be approved by your health care provider.  You may urinate more often because the fetus is pressing on your bladder.  You may develop or continue to have heartburn as a result of your pregnancy.  You may develop constipation because certain hormones are causing the muscles that push waste through your intestines to slow down.  You may develop hemorrhoids or  swollen, bulging veins (varicose veins).  You may have back pain. This is caused by: ? Weight gain. ? Pregnancy hormones that are relaxing the joints in your pelvis. ? A shift in weight and the muscles that support your balance.  Your breasts will continue to grow and they will continue to become tender.  Your gums may bleed and may be sensitive to brushing and flossing.  Dark spots or blotches (chloasma, mask of pregnancy) may develop on your face. This will likely fade after the baby is born.  A dark line from your belly button to the pubic area (linea nigra) may appear. This will likely fade after the baby is born.  You may have changes in your hair. These can include thickening of your hair, rapid growth, and changes in texture. Some women also have hair loss during or after pregnancy, or hair that feels dry or thin. Your hair will most likely return to normal after your baby is born.  What to expect at prenatal visits During a routine prenatal visit:  You will be weighed to make sure you and the fetus are growing normally.  Your blood pressure will be taken.  Your abdomen will be measured to track your baby's growth.  The fetal heartbeat will be listened to.  Any test results from the previous visit will be discussed.  Your health care provider may ask you:  How you are feeling.  If you are feeling the baby move.  If you have had any abnormal symptoms, such as leaking fluid, bleeding, severe headaches, or abdominal cramping.  If you are using any tobacco products, including cigarettes, chewing tobacco, and electronic cigarettes.  If you have any questions.  Other tests that may be performed during your second trimester include:  Blood tests that check for: ? Low iron levels (anemia). ? High blood sugar that affects pregnant women (gestational diabetes) between 24 and 28 weeks. ? Rh antibodies. This is to check for a protein on red blood cells (Rh factor).  Urine  tests to   check for infections, diabetes, or protein in the urine.  An ultrasound to confirm the proper growth and development of the baby.  An amniocentesis to check for possible genetic problems.  Fetal screens for spina bifida and Down syndrome.  HIV (human immunodeficiency virus) testing. Routine prenatal testing includes screening for HIV, unless you choose not to have this test.  Follow these instructions at home: Medicines  Follow your health care provider's instructions regarding medicine use. Specific medicines may be either safe or unsafe to take during pregnancy.  Take a prenatal vitamin that contains at least 600 micrograms (mcg) of folic acid.  If you develop constipation, try taking a stool softener if your health care provider approves. Eating and drinking  Eat a balanced diet that includes fresh fruits and vegetables, whole grains, good sources of protein such as meat, eggs, or tofu, and low-fat dairy. Your health care provider will help you determine the amount of weight gain that is right for you.  Avoid raw meat and uncooked cheese. These carry germs that can cause birth defects in the baby.  If you have low calcium intake from food, talk to your health care provider about whether you should take a daily calcium supplement.  Limit foods that are high in fat and processed sugars, such as fried and sweet foods.  To prevent constipation: ? Drink enough fluid to keep your urine clear or pale yellow. ? Eat foods that are high in fiber, such as fresh fruits and vegetables, whole grains, and beans. Activity  Exercise only as directed by your health care provider. Most women can continue their usual exercise routine during pregnancy. Try to exercise for 30 minutes at least 5 days a week. Stop exercising if you experience uterine contractions.  Avoid heavy lifting, wear low heel shoes, and practice good posture.  A sexual relationship may be continued unless your health  care provider directs you otherwise. Relieving pain and discomfort  Wear a good support bra to prevent discomfort from breast tenderness.  Take warm sitz baths to soothe any pain or discomfort caused by hemorrhoids. Use hemorrhoid cream if your health care provider approves.  Rest with your legs elevated if you have leg cramps or low back pain.  If you develop varicose veins, wear support hose. Elevate your feet for 15 minutes, 3-4 times a day. Limit salt in your diet. Prenatal Care  Write down your questions. Take them to your prenatal visits.  Keep all your prenatal visits as told by your health care provider. This is important. Safety  Wear your seat belt at all times when driving.  Make a list of emergency phone numbers, including numbers for family, friends, the hospital, and police and fire departments. General instructions  Ask your health care provider for a referral to a local prenatal education class. Begin classes no later than the beginning of month 6 of your pregnancy.  Ask for help if you have counseling or nutritional needs during pregnancy. Your health care provider can offer advice or refer you to specialists for help with various needs.  Do not use hot tubs, steam rooms, or saunas.  Do not douche or use tampons or scented sanitary pads.  Do not cross your legs for long periods of time.  Avoid cat litter boxes and soil used by cats. These carry germs that can cause birth defects in the baby and possibly loss of the fetus by miscarriage or stillbirth.  Avoid all smoking, herbs, alcohol, and unprescribed drugs. Chemicals   in these products can affect the formation and growth of the baby.  Do not use any products that contain nicotine or tobacco, such as cigarettes and e-cigarettes. If you need help quitting, ask your health care provider.  Visit your dentist if you have not gone yet during your pregnancy. Use a soft toothbrush to brush your teeth and be gentle when  you floss. Contact a health care provider if:  You have dizziness.  You have mild pelvic cramps, pelvic pressure, or nagging pain in the abdominal area.  You have persistent nausea, vomiting, or diarrhea.  You have a bad smelling vaginal discharge.  You have pain when you urinate. Get help right away if:  You have a fever.  You are leaking fluid from your vagina.  You have spotting or bleeding from your vagina.  You have severe abdominal cramping or pain.  You have rapid weight gain or weight loss.  You have shortness of breath with chest pain.  You notice sudden or extreme swelling of your face, hands, ankles, feet, or legs.  You have not felt your baby move in over an hour.  You have severe headaches that do not go away when you take medicine.  You have vision changes. Summary  The second trimester is from week 14 through week 27 (months 4 through 6). It is also a time when the fetus is growing rapidly.  Your body goes through many changes during pregnancy. The changes vary from woman to woman.  Avoid all smoking, herbs, alcohol, and unprescribed drugs. These chemicals affect the formation and growth your baby.  Do not use any tobacco products, such as cigarettes, chewing tobacco, and e-cigarettes. If you need help quitting, ask your health care provider.  Contact your health care provider if you have any questions. Keep all prenatal visits as told by your health care provider. This is important. This information is not intended to replace advice given to you by your health care provider. Make sure you discuss any questions you have with your health care provider. Document Released: 06/16/2001 Document Revised: 07/28/2016 Document Reviewed: 07/28/2016 [employer] Interactive Patient Education  2018 [employer]   Contraception Choices Contraception, also called birth control, refers to methods or devices that prevent pregnancy. Hormonal methods Contraceptive  implant A contraceptive implant is a thin, plastic tube that contains a hormone. It is inserted into the upper part of the arm. It can remain in place for up to 3 years. Progestin-only injections Progestin-only injections are injections of progestin, a synthetic form of the hormone progesterone. They are given every 3 months by a health care provider. Birth control pills Birth control pills are pills that contain hormones that prevent pregnancy. They must be taken once a day, preferably at the same time each day. Birth control patch The birth control patch contains hormones that prevent pregnancy. It is placed on the skin and must be changed once a week for three weeks and removed on the fourth week. A prescription is needed to use this method of contraception. Vaginal ring A vaginal ring contains hormones that prevent pregnancy. It is placed in the vagina for three weeks and removed on the fourth week. After that, the process is repeated with a new ring. A prescription is needed to use this method of contraception. Emergency contraceptive Emergency contraceptives prevent pregnancy after unprotected sex. They come in pill form and can be taken up to 5 days after sex. They work best the sooner they are taken after having   sex. Most emergency contraceptives are available without a prescription. This method should not be used as your only form of birth control. Barrier methods Female condom A female condom is a thin sheath that is worn over the penis during sex. Condoms keep sperm from going inside a woman's body. They can be used with a spermicide to increase their effectiveness. They should be disposed after a single use. Female condom A female condom is a soft, loose-fitting sheath that is put into the vagina before sex. The condom keeps sperm from going inside a woman's body. They should be disposed after a single use. Diaphragm A diaphragm is a soft, dome-shaped barrier. It is inserted into the vagina  before sex, along with a spermicide. The diaphragm blocks sperm from entering the uterus, and the spermicide kills sperm. A diaphragm should be left in the vagina for 6-8 hours after sex and removed within 24 hours. A diaphragm is prescribed and fitted by a health care provider. A diaphragm should be replaced every 1-2 years, after giving birth, after gaining more than 15 lb (6.8 kg), and after pelvic surgery. Cervical cap A cervical cap is a round, soft latex or plastic cup that fits over the cervix. It is inserted into the vagina before sex, along with spermicide. It blocks sperm from entering the uterus. The cap should be left in place for 6-8 hours after sex and removed within 48 hours. A cervical cap must be prescribed and fitted by a health care provider. It should be replaced every 2 years. Sponge A sponge is a soft, circular piece of polyurethane foam with spermicide on it. The sponge helps block sperm from entering the uterus, and the spermicide kills sperm. To use it, you make it wet and then insert it into the vagina. It should be inserted before sex, left in for at least 6 hours after sex, and removed and thrown away within 30 hours. Spermicides Spermicides are chemicals that kill or block sperm from entering the cervix and uterus. They can come as a cream, jelly, suppository, foam, or tablet. A spermicide should be inserted into the vagina with an applicator at least 10-15 minutes before sex to allow time for it to work. The process must be repeated every time you have sex. Spermicides do not require a prescription. Intrauterine contraception Intrauterine device (IUD) An IUD is a T-shaped device that is put in a woman's uterus. There are two types:  Hormone IUD.This type contains progestin, a synthetic form of the hormone progesterone. This type can stay in place for 3-5 years.  Copper IUD.This type is wrapped in copper wire. It can stay in place for 10 years.  Permanent methods of  contraception Female tubal ligation In this method, a woman's fallopian tubes are sealed, tied, or blocked during surgery to prevent eggs from traveling to the uterus. Hysteroscopic sterilization In this method, a small, flexible insert is placed into each fallopian tube. The inserts cause scar tissue to form in the fallopian tubes and block them, so sperm cannot reach an egg. The procedure takes about 3 months to be effective. Another form of birth control must be used during those 3 months. Female sterilization This is a procedure to tie off the tubes that carry sperm (vasectomy). After the procedure, the man can still ejaculate fluid (semen). Natural planning methods Natural family planning In this method, a couple does not have sex on days when the woman could become pregnant. Calendar method This means keeping track of the   length of each menstrual cycle, identifying the days when pregnancy can happen, and not having sex on those days. Ovulation method In this method, a couple avoids sex during ovulation. Symptothermal method This method involves not having sex during ovulation. The woman typically checks for ovulation by watching changes in her temperature and in the consistency of cervical mucus. Post-ovulation method In this method, a couple waits to have sex until after ovulation. Summary  Contraception, also called birth control, means methods or devices that prevent pregnancy.  Hormonal methods of contraception include implants, injections, pills, patches, vaginal rings, and emergency contraceptives.  Barrier methods of contraception can include female condoms, female condoms, diaphragms, cervical caps, sponges, and spermicides.  There are two types of IUDs (intrauterine devices). An IUD can be put in a woman's uterus to prevent pregnancy for 3-5 years.  Permanent sterilization can be done through a procedure for males, females, or both.  Natural family planning methods involve  not having sex on days when the woman could become pregnant. This information is not intended to replace advice given to you by your health care provider. Make sure you discuss any questions you have with your health care provider. Document Released: 06/22/2005 Document Revised: 07/25/2016 Document Reviewed: 07/25/2016 Elsevier Interactive Patient Education  2018 [employer] Inc.   Breastfeeding Choosing to breastfeed is one of the best decisions you can make for yourself and your baby. A change in hormones during pregnancy causes your breasts to make breast milk in your milk-producing glands. Hormones prevent breast milk from being released before your baby is born. They also prompt milk flow after birth. Once breastfeeding has begun, thoughts of your baby, as well as his or her sucking or crying, can stimulate the release of milk from your milk-producing glands. Benefits of breastfeeding Research shows that breastfeeding offers many health benefits for infants and mothers. It also offers a cost-free and convenient way to feed your baby. For your baby  Your first milk (colostrum) helps your baby's digestive system to function better.  Special cells in your milk (antibodies) help your baby to fight off infections.  Breastfed babies are less likely to develop asthma, allergies, obesity, or type 2 diabetes. They are also at lower risk for sudden infant death syndrome (SIDS).  Nutrients in breast milk are better able to meet your baby's needs compared to infant formula.  Breast milk improves your baby's brain development. For you  Breastfeeding helps to create a very special bond between you and your baby.  Breastfeeding is convenient. Breast milk costs nothing and is always available at the correct temperature.  Breastfeeding helps to burn calories. It helps you to lose the weight that you gained during pregnancy.  Breastfeeding makes your uterus return faster to its size before pregnancy.  It also slows bleeding (lochia) after you give birth.  Breastfeeding helps to lower your risk of developing type 2 diabetes, osteoporosis, rheumatoid arthritis, cardiovascular disease, and breast, ovarian, uterine, and endometrial cancer later in life. Breastfeeding basics Starting breastfeeding  Find a comfortable place to sit or lie down, with your neck and back well-supported.  Place a pillow or a rolled-up blanket under your baby to bring him or her to the level of your breast (if you are seated). Nursing pillows are specially designed to help support your arms and your baby while you breastfeed.  Make sure that your baby's tummy (abdomen) is facing your abdomen.  Gently massage your breast. With your fingertips, massage from the outer edges of   your breast inward toward the nipple. This encourages milk flow. If your milk flows slowly, you may need to continue this action during the feeding.  Support your breast with 4 fingers underneath and your thumb above your nipple (make the letter "C" with your hand). Make sure your fingers are well away from your nipple and your baby's mouth.  Stroke your baby's lips gently with your finger or nipple.  When your baby's mouth is open wide enough, quickly bring your baby to your breast, placing your entire nipple and as much of the areola as possible into your baby's mouth. The areola is the colored area around your nipple. ? More areola should be visible above your baby's upper lip than below the lower lip. ? Your baby's lips should be opened and extended outward (flanged) to ensure an adequate, comfortable latch. ? Your baby's tongue should be between his or her lower gum and your breast.  Make sure that your baby's mouth is correctly positioned around your nipple (latched). Your baby's lips should create a seal on your breast and be turned out (everted).  It is common for your baby to suck about 2-3 minutes in order to start the flow of breast  milk. Latching Teaching your baby how to latch onto your breast properly is very important. An improper latch can cause nipple pain, decreased milk supply, and poor weight gain in your baby. Also, if your baby is not latched onto your nipple properly, he or she may swallow some air during feeding. This can make your baby fussy. Burping your baby when you switch breasts during the feeding can help to get rid of the air. However, teaching your baby to latch on properly is still the best way to prevent fussiness from swallowing air while breastfeeding. Signs that your baby has successfully latched onto your nipple  Silent tugging or silent sucking, without causing you pain. Infant's lips should be extended outward (flanged).  Swallowing heard between every 3-4 sucks once your milk has started to flow (after your let-down milk reflex occurs).  Muscle movement above and in front of his or her ears while sucking.  Signs that your baby has not successfully latched onto your nipple  Sucking sounds or smacking sounds from your baby while breastfeeding.  Nipple pain.  If you think your baby has not latched on correctly, slip your finger into the corner of your baby's mouth to break the suction and place it between your baby's gums. Attempt to start breastfeeding again. Signs of successful breastfeeding Signs from your baby  Your baby will gradually decrease the number of sucks or will completely stop sucking.  Your baby will fall asleep.  Your baby's body will relax.  Your baby will retain a small amount of milk in his or her mouth.  Your baby will let go of your breast by himself or herself.  Signs from you  Breasts that have increased in firmness, weight, and size 1-3 hours after feeding.  Breasts that are softer immediately after breastfeeding.  Increased milk volume, as well as a change in milk consistency and color by the fifth day of breastfeeding.  Nipples that are not sore,  cracked, or bleeding.  Signs that your baby is getting enough milk  Wetting at least 1-2 diapers during the first 24 hours after birth.  Wetting at least 5-6 diapers every 24 hours for the first week after birth. The urine should be clear or pale yellow by the age of 5   days.  Wetting 6-8 diapers every 24 hours as your baby continues to grow and develop.  At least 3 stools in a 24-hour period by the age of 5 days. The stool should be soft and yellow.  At least 3 stools in a 24-hour period by the age of 7 days. The stool should be seedy and yellow.  No loss of weight greater than 10% of birth weight during the first 3 days of life.  Average weight gain of 4-7 oz (113-198 g) per week after the age of 4 days.  Consistent daily weight gain by the age of 5 days, without weight loss after the age of 2 weeks. After a feeding, your baby may spit up a small amount of milk. This is normal. Breastfeeding frequency and duration Frequent feeding will help you make more milk and can prevent sore nipples and extremely full breasts (breast engorgement). Breastfeed when you feel the need to reduce the fullness of your breasts or when your baby shows signs of hunger. This is called "breastfeeding on demand." Signs that your baby is hungry include:  Increased alertness, activity, or restlessness.  Movement of the head from side to side.  Opening of the mouth when the corner of the mouth or cheek is stroked (rooting).  Increased sucking sounds, smacking lips, cooing, sighing, or squeaking.  Hand-to-mouth movements and sucking on fingers or hands.  Fussing or crying.  Avoid introducing a pacifier to your baby in the first 4-6 weeks after your baby is born. After this time, you may choose to use a pacifier. Research has shown that pacifier use during the first year of a baby's life decreases the risk of sudden infant death syndrome (SIDS). Allow your baby to feed on each breast as long as he or she  wants. When your baby unlatches or falls asleep while feeding from the first breast, offer the second breast. Because newborns are often sleepy in the first few weeks of life, you may need to awaken your baby to get him or her to feed. Breastfeeding times will vary from baby to baby. However, the following rules can serve as a guide to help you make sure that your baby is properly fed:  Newborns (babies 4 weeks of age or younger) may breastfeed every 1-3 hours.  Newborns should not go without breastfeeding for longer than 3 hours during the day or 5 hours during the night.  You should breastfeed your baby a minimum of 8 times in a 24-hour period.  Breast milk pumping Pumping and storing breast milk allows you to make sure that your baby is exclusively fed your breast milk, even at times when you are unable to breastfeed. This is especially important if you go back to work while you are still breastfeeding, or if you are not able to be present during feedings. Your lactation consultant can help you find a method of pumping that works best for you and give you guidelines about how long it is safe to store breast milk. Caring for your breasts while you breastfeed Nipples can become dry, cracked, and sore while breastfeeding. The following recommendations can help keep your breasts moisturized and healthy:  Avoid using soap on your nipples.  Wear a supportive bra designed especially for nursing. Avoid wearing underwire-style bras or extremely tight bras (sports bras).  Air-dry your nipples for 3-4 minutes after each feeding.  Use only cotton bra pads to absorb leaked breast milk. Leaking of breast milk between feedings is normal.    Use lanolin on your nipples after breastfeeding. Lanolin helps to maintain your skin's normal moisture barrier. Pure lanolin is not harmful (not toxic) to your baby. You may also hand express a few drops of breast milk and gently massage that milk into your nipples and  allow the milk to air-dry.  In the first few weeks after giving birth, some women experience breast engorgement. Engorgement can make your breasts feel heavy, warm, and tender to the touch. Engorgement peaks within 3-5 days after you give birth. The following recommendations can help to ease engorgement:  Completely empty your breasts while breastfeeding or pumping. You may want to start by applying warm, moist heat (in the shower or with warm, water-soaked hand towels) just before feeding or pumping. This increases circulation and helps the milk flow. If your baby does not completely empty your breasts while breastfeeding, pump any extra milk after he or she is finished.  Apply ice packs to your breasts immediately after breastfeeding or pumping, unless this is too uncomfortable for you. To do this: ? Put ice in a plastic bag. ? Place a towel between your skin and the bag. ? Leave the ice on for 20 minutes, 2-3 times a day.  Make sure that your baby is latched on and positioned properly while breastfeeding.  If engorgement persists after 48 hours of following these recommendations, contact your health care provider or a lactation consultant. Overall health care recommendations while breastfeeding  Eat 3 healthy meals and 3 snacks every day. Well-nourished mothers who are breastfeeding need an additional 450-500 calories a day. You can meet this requirement by increasing the amount of a balanced diet that you eat.  Drink enough water to keep your urine pale yellow or clear.  Rest often, relax, and continue to take your prenatal vitamins to prevent fatigue, stress, and low vitamin and mineral levels in your body (nutrient deficiencies).  Do not use any products that contain nicotine or tobacco, such as cigarettes and e-cigarettes. Your baby may be harmed by chemicals from cigarettes that pass into breast milk and exposure to secondhand smoke. If you need help quitting, ask your health care  provider.  Avoid alcohol.  Do not use illegal drugs or marijuana.  Talk with your health care provider before taking any medicines. These include over-the-counter and prescription medicines as well as vitamins and herbal supplements. Some medicines that may be harmful to your baby can pass through breast milk.  It is possible to become pregnant while breastfeeding. If birth control is desired, ask your health care provider about options that will be safe while breastfeeding your baby. Where to find more information: [employer]: www.llli.org Contact a health care provider if:  You feel like you want to stop breastfeeding or have become frustrated with breastfeeding.  Your nipples are cracked or bleeding.  Your breasts are red, tender, or warm.  You have: ? Painful breasts or nipples. ? A swollen area on either breast. ? A fever or chills. ? Nausea or vomiting. ? Drainage other than breast milk from your nipples.  Your breasts do not become full before feedings by the fifth day after you give birth.  You feel sad and depressed.  Your baby is: ? Too sleepy to eat well. ? Having trouble sleeping. ? More than 1 week old and wetting fewer than 6 diapers in a 24-hour period. ? Not gaining weight by 5 days of age.  Your baby has fewer than 3 stools in a   24-hour period.  Your baby's skin or the white parts of his or her eyes become yellow. Get help right away if:  Your baby is overly tired (lethargic) and does not want to wake up and feed.  Your baby develops an unexplained fever. Summary  Breastfeeding offers many health benefits for infant and mothers.  Try to breastfeed your infant when he or she shows early signs of hunger.  Gently tickle or stroke your baby's lips with your finger or nipple to allow the baby to open his or her mouth. Bring the baby to your breast. Make sure that much of the areola is in your baby's mouth. Offer one side and burp the  baby before you offer the other side.  Talk with your health care provider or lactation consultant if you have questions or you face problems as you breastfeed. This information is not intended to replace advice given to you by your health care provider. Make sure you discuss any questions you have with your health care provider. Document Released: 06/22/2005 Document Revised: 07/24/2016 Document Reviewed: 07/24/2016 Elsevier Interactive Patient Education  2018 Elsevier Inc.  

## 2018-06-20 NOTE — Progress Notes (Signed)
Subjective:    Deanna Walker is a [id number] [redacted]w[redacted]d being seen today for her first obstetrical visit.  Her obstetrical history is significant for [facility], history of gestational diabetes, History of incompetent cervix resulting in a 23 week loss. Patient does intend to breast feed. Pregnancy history fully reviewed.  Patient reports some brown vaginal spotting. Patient seen on 12/6 for postcoital bleeding and ultrasound demonstrated the presence of a subchorionic hemorrhage  Vitals:   06/20/18 1323  BP: 132/80  Pulse: 93  Weight: 194 lb 1.6 oz (88 kg)    HISTORY: OB History  Gravida Para Term Preterm AB Living  3 1   1 1  0  SAB TAB Ectopic Multiple Live Births      1   1    # Outcome Date GA Lbr Len/2nd Weight Sex Delivery Anes PTL Lv  3 Current           2 Ectopic 10/2017          1 Preterm 05/02/16 [redacted]w[redacted]d / 00:07 15.9 oz (0.45 kg) F Vag-Spont None  ND     Birth Comments: Pecola Leisure was born alive and went to NICU but died within weeks afterward     Complications: Neonatal death   Past Medical History:  Diagnosis Date  . Abnormal Pap smear 2011   colpo and biopsy done, normal paps since  . Boil of buttock   . Boil, axilla   . Depression   . Diabetes mellitus    pre diabetes- using metformin to bring numbers down  . Gestational diabetes   . Infertility associated with anovulation 01/24/2013  . Obesity   . PCOS (polycystic ovarian syndrome)   . Perennial and seasonal allergic rhinoconjunctivitis 09/25/2015  . Short cervix in second trimester, antepartum 04/28/2016   Past Surgical History:  Procedure Laterality Date  . ABCESS DRAINAGE    . COLPOSCOPY     Family History  Problem Relation Age of Onset  . Diabetes Maternal Aunt   . Hypertension Maternal Aunt   . Seizures Mother   . Allergic rhinitis Neg Hx   . Angioedema Neg Hx   . Asthma Neg Hx   . Atopy Neg Hx   . Eczema Neg Hx   . Immunodeficiency Neg Hx   . Urticaria Neg Hx   . Cystic fibrosis Neg Hx   . Lupus Neg Hx    . Emphysema Neg Hx   . Migraines Neg Hx      Exam    Uterus:   8-weeks  Pelvic Exam:    Perineum: No Hemorrhoids, Normal Perineum   Vulva: normal   Vagina:  normal mucosa, normal discharge   pH:    Cervix: multiparous appearance and cervix is closed and long   Adnexa: normal adnexa and no mass, fullness, tenderness   Bony Pelvis: gynecoid  System: Breast:  normal appearance, no masses or tenderness   Skin: normal coloration and turgor, no rashes    Neurologic: oriented, no focal deficits   Extremities: normal strength, tone, and muscle mass   HEENT extra ocular movement intact   Mouth/Teeth mucous membranes moist, pharynx normal without lesions and dental hygiene good   Neck supple and no masses   Cardiovascular: regular rate and rhythm   Respiratory:  appears well, vitals normal, no respiratory distress, acyanotic, normal RR, chest clear, no wheezing, crepitations, rhonchi, normal symmetric air entry   Abdomen: soft, non-tender; bowel sounds normal; no masses,  no organomegaly   Urinary:  Assessment:    Pregnancy: [id number] Patient Active Problem List   Diagnosis Date Noted  . History of gestational diabetes in prior pregnancy, currently pregnant 06/20/2018  . Supervision of high risk pregnancy, antepartum 06/20/2018  . History of incompetent cervix, currently pregnant 06/20/2018  . AMA (advanced maternal age) primigravida 35+ 06/20/2018  . Chronic hypertension 09/17/2017  . Chronic urticaria 09/25/2015  . Prediabetes 11/12/2013  . PCOS (polycystic ovarian syndrome) 02/11/2011        Plan:     Initial labs drawn. Prenatal vitamins. Problem list reviewed and updated. Genetic Screening discussed : panorama next visit due to early gestational age .  Ultrasound discussed; fetal survey: requested. Patient normotensive without medication Patient reports h/o PCOS and has been on metformin. She denies a history of GDM Baseline labs ordered Discussed benefit  of cerclage in the setting of incompetent cervix. Patient desires to have it. Patient will be scheduled  Follow up in 4 weeks. 50% of 30 min visit spent on counseling and coordination of care.     Silvia Hightower 06/20/2018

## 2018-06-21 ENCOUNTER — Telehealth: Payer: Self-pay | Admitting: *Deleted

## 2018-06-21 ENCOUNTER — Encounter (HOSPITAL_COMMUNITY): Payer: Self-pay

## 2018-06-21 LAB — OBSTETRIC PANEL, INCLUDING HIV
Antibody Screen: NEGATIVE
Basophils Absolute: 0 10*3/uL (ref 0.0–0.2)
Basos: 0 %
EOS (ABSOLUTE): 0.1 10*3/uL (ref 0.0–0.4)
EOS: 1 %
HEMOGLOBIN: 13.3 g/dL (ref 11.1–15.9)
HEP B S AG: NEGATIVE
HIV Screen 4th Generation wRfx: NONREACTIVE
Hematocrit: 40.1 % (ref 34.0–46.6)
IMMATURE GRANS (ABS): 0.1 10*3/uL (ref 0.0–0.1)
Immature Granulocytes: 1 %
LYMPHS: 38 %
Lymphocytes Absolute: 4.6 10*3/uL — ABNORMAL HIGH (ref 0.7–3.1)
MCH: 26.1 pg — ABNORMAL LOW (ref 26.6–33.0)
MCHC: 33.2 g/dL (ref 31.5–35.7)
MCV: 79 fL (ref 79–97)
MONOS ABS: 1.1 10*3/uL — AB (ref 0.1–0.9)
Monocytes: 9 %
NEUTROS PCT: 51 %
Neutrophils Absolute: 6.4 10*3/uL (ref 1.4–7.0)
Platelets: 432 10*3/uL (ref 150–450)
RBC: 5.09 x10E6/uL (ref 3.77–5.28)
RDW: 14 % (ref 12.3–15.4)
RH TYPE: POSITIVE
RPR: NONREACTIVE
Rubella Antibodies, IGG: 7.69 index (ref >0.99)
WBC: 12.3 10*3/uL — ABNORMAL HIGH (ref 3.4–10.8)

## 2018-06-21 LAB — PROTEIN / CREATININE RATIO, URINE
CREATININE, UR: 93.7 mg/dL
PROTEIN UR: 7.1 mg/dL
PROTEIN/CREAT RATIO: 76 mg/g{creat} (ref 0–200)

## 2018-06-21 LAB — TSH: TSH: 1.27 u[IU]/mL (ref 0.450–4.500)

## 2018-06-21 LAB — COMPREHENSIVE METABOLIC PANEL
A/G RATIO: 1.5 (ref 1.2–2.2)
ALT: 13 IU/L (ref 0–32)
AST: 17 IU/L (ref 0–40)
Albumin: 4.9 g/dL (ref 3.5–5.5)
Alkaline Phosphatase: 72 IU/L (ref 39–117)
BUN/Creatinine Ratio: 10 (ref 9–23)
BUN: 8 mg/dL (ref 6–20)
Bilirubin Total: <0.2 mg/dL (ref 0.0–1.2)
CALCIUM: 10.4 mg/dL — AB (ref 8.7–10.2)
CO2: 21 mmol/L (ref 20–29)
CREATININE: 0.77 mg/dL (ref 0.57–1.00)
Chloride: 98 mmol/L (ref 96–106)
GFR calc Af Amer: 114 mL/min/{1.73_m2} (ref >59)
GFR calc non Af Amer: 99 mL/min/{1.73_m2} (ref >59)
Globulin, Total: 3.3 g/dL (ref 1.5–4.5)
Glucose: 67 mg/dL (ref 65–99)
POTASSIUM: 4.3 mmol/L (ref 3.5–5.2)
Sodium: 136 mmol/L (ref 134–144)
Total Protein: 8.2 g/dL (ref 6.0–8.5)

## 2018-06-21 LAB — HEMOGLOBIN A1C
Est. average glucose Bld gHb Est-mCnc: 137 mg/dL
Hgb A1c MFr Bld: 6.4 % — ABNORMAL HIGH (ref 4.8–5.6)

## 2018-06-21 NOTE — Telephone Encounter (Signed)
-----   Message from Deanna AntiguaPeggy Constant, MD sent at 06/21/2018 11:11 AM EST ----- Please have patient return before her next appointment for 2 hour glucola. Please remind patient to come fasting  Thanks  [name]

## 2018-06-21 NOTE — Telephone Encounter (Signed)
Pt made aware of need for early 2 hour GTT and has been scheduled.

## 2018-06-22 ENCOUNTER — Telehealth (HOSPITAL_COMMUNITY): Payer: Self-pay | Admitting: *Deleted

## 2018-06-22 LAB — URINE CULTURE, OB REFLEX

## 2018-06-22 LAB — CULTURE, OB URINE

## 2018-06-22 NOTE — Telephone Encounter (Signed)
Preadmission screen  

## 2018-06-23 ENCOUNTER — Encounter (HOSPITAL_COMMUNITY): Payer: Self-pay | Admitting: *Deleted

## 2018-06-23 ENCOUNTER — Telehealth (HOSPITAL_COMMUNITY): Payer: Self-pay | Admitting: *Deleted

## 2018-06-23 LAB — CYTOLOGY - PAP
DIAGNOSIS: NEGATIVE
HPV: NOT DETECTED

## 2018-06-23 NOTE — Telephone Encounter (Signed)
Preadmission screen  

## 2018-06-24 ENCOUNTER — Other Ambulatory Visit: Payer: Medicaid Other

## 2018-06-24 DIAGNOSIS — O24911 Unspecified diabetes mellitus in pregnancy, first trimester: Secondary | ICD-10-CM

## 2018-06-24 DIAGNOSIS — O0990 Supervision of high risk pregnancy, unspecified, unspecified trimester: Secondary | ICD-10-CM

## 2018-06-24 DIAGNOSIS — Z8632 Personal history of gestational diabetes: Secondary | ICD-10-CM

## 2018-06-24 DIAGNOSIS — O09299 Supervision of pregnancy with other poor reproductive or obstetric history, unspecified trimester: Secondary | ICD-10-CM

## 2018-06-24 DIAGNOSIS — O24419 Gestational diabetes mellitus in pregnancy, unspecified control: Secondary | ICD-10-CM

## 2018-06-25 LAB — GLUCOSE TOLERANCE, 2 HOURS W/ 1HR
GLUCOSE, 1 HOUR: 290 mg/dL — AB (ref 65–179)
Glucose, 2 hour: 268 mg/dL — ABNORMAL HIGH (ref 65–152)
Glucose, Fasting: 93 mg/dL — ABNORMAL HIGH (ref 65–91)

## 2018-06-30 ENCOUNTER — Encounter: Payer: Medicaid Other | Admitting: Certified Nurse Midwife

## 2018-06-30 ENCOUNTER — Other Ambulatory Visit: Payer: Self-pay

## 2018-06-30 DIAGNOSIS — O24419 Gestational diabetes mellitus in pregnancy, unspecified control: Secondary | ICD-10-CM

## 2018-06-30 MED ORDER — ACCU-CHEK GUIDE ME W/DEVICE KIT: 1.0000 | PACK | Freq: Four times a day (QID) | 0 refills | Status: AC

## 2018-06-30 MED ORDER — GLUCOSE BLOOD VI STRP: ORAL_STRIP | 12 refills | Status: AC

## 2018-06-30 MED ORDER — ACCU-CHEK FASTCLIX LANCETS MISC: 1.0000 | Freq: Four times a day (QID) | 12 refills | Status: AC

## 2018-06-30 MED ORDER — GLUCOSE BLOOD VI STRP: ORAL_STRIP | 12 refills | Status: DC

## 2018-07-01 ENCOUNTER — Encounter (HOSPITAL_COMMUNITY): Admission: RE | Admit: 2018-07-01 | Payer: Medicaid Other | Source: Ambulatory Visit

## 2018-07-01 LAB — SMN1 COPY NUMBER ANALYSIS (SMA CARRIER SCREENING)

## 2018-07-01 LAB — CYSTIC FIBROSIS MUTATION 97: GENE DIS ANAL CARRIER INTERP BLD/T-IMP: NOT DETECTED

## 2018-07-03 ENCOUNTER — Encounter (HOSPITAL_COMMUNITY): Payer: Self-pay | Admitting: Anesthesiology

## 2018-07-03 NOTE — Anesthesia Preprocedure Evaluation (Deleted)
Anesthesia Evaluation    Reviewed: Allergy & Precautions, Patient's Chart, lab work & pertinent test results  Airway        Dental   Pulmonary neg pulmonary ROS,           Cardiovascular Exercise Tolerance: Good      Neuro/Psych Depression negative neurological ROS     GI/Hepatic negative GI ROS, Neg liver ROS,   Endo/Other  diabetes, Oral Hypoglycemic Agents  Renal/GU negative Renal ROS     Musculoskeletal negative musculoskeletal ROS (+)   Abdominal (+) + obese,   Peds  Hematology negative hematology ROS (+)   Anesthesia Other Findings   Reproductive/Obstetrics (+) Pregnancy                             Anesthesia Physical Anesthesia Plan  ASA: II  Anesthesia Plan: Spinal   Post-op Pain Management:    Induction:   PONV Risk Score and Plan: 2 and Treatment may vary due to age or medical condition, Ondansetron and Dexamethasone  Airway Management Planned: Natural Airway and Nasal Cannula  Additional Equipment:   Intra-op Plan:   Post-operative Plan: Extubation in OR  Informed Consent: I have reviewed the patients History and Physical, chart, labs and discussed the procedure including the risks, benefits and alternatives for the proposed anesthesia with the patient or authorized representative who has indicated his/her understanding and acceptance.   Dental advisory given  Plan Discussed with: CRNA  Anesthesia Plan Comments:         Anesthesia Quick Evaluation

## 2018-07-04 ENCOUNTER — Ambulatory Visit (HOSPITAL_COMMUNITY)
Admission: AD | Admit: 2018-07-04 | Discharge: 2018-07-04 | Disposition: A | Discharge status: Home / Self Care | Payer: Medicaid Other | Attending: Obstetrics & Gynecology | Admitting: Obstetrics & Gynecology

## 2018-07-04 DIAGNOSIS — Z3A10 10 weeks gestation of pregnancy: Secondary | ICD-10-CM | POA: Insufficient documentation

## 2018-07-04 DIAGNOSIS — Z538 Procedure and treatment not carried out for other reasons: Secondary | ICD-10-CM | POA: Diagnosis not present

## 2018-07-04 DIAGNOSIS — O3431 Maternal care for cervical incompetence, first trimester: Principal | ICD-10-CM | POA: Insufficient documentation

## 2018-07-04 DIAGNOSIS — O09299 Supervision of pregnancy with other poor reproductive or obstetric history, unspecified trimester: Secondary | ICD-10-CM

## 2018-07-04 LAB — CBC
HCT: 38.6 % (ref 36.0–46.0)
Hemoglobin: 12.6 g/dL (ref 12.0–15.0)
MCH: 26 pg (ref 26.0–34.0)
MCHC: 32.6 g/dL (ref 30.0–36.0)
MCV: 79.8 fL — ABNORMAL LOW (ref 80.0–100.0)
Platelets: 367 10*3/uL (ref 150–400)
RBC: 4.84 MIL/uL (ref 3.87–5.11)
RDW: 14.5 % (ref 11.5–15.5)
WBC: 9.9 10*3/uL (ref 4.0–10.5)
nRBC: 0 % (ref 0.0–0.2)

## 2018-07-04 LAB — GLUCOSE, CAPILLARY: Glucose-Capillary: 166 mg/dL — ABNORMAL HIGH (ref 70–99)

## 2018-07-04 MED ORDER — INDOMETHACIN 50 MG RE SUPP
100.0000 mg | Freq: Once | RECTAL | Status: DC
  Filled 2018-07-04: qty 2

## 2018-07-04 MED ORDER — LACTATED RINGERS IV SOLN: INTRAVENOUS | Status: DC

## 2018-07-04 NOTE — Discharge Instructions (Signed)
Discharge to home cerclage rescheduled for Jan 14th.  Nothing to eat or drink as instructed prior to surgery   Cerclage of the Cervix Cerclage of the cervix is a surgical procedure for an incompetent cervix. An incompetent cervix is a weak cervix that opens up before labor begins. Cerclage of the cervix sews the cervix closed during pregnancy.  LET YOUR CAREGIVER KNOW ABOUT:   Allergies to foods or medications.  All over-the-counter, prescription, herbal, eye drops and cream medications you are using.  Taking illegal drugs or drinking an excessive amount of alcohol.  Any recent colds or infections.  Past problems with anesthetics or novocaine.  Past surgery.  History of blood clots or abnormal bleeding problems.  Other medical or health problems. RISKS AND COMPLICATIONS   Infection.  Bleeding.  Rupturing the amniotic sac (membranes).  Going into early labor and delivery.  Problems with the anesthesia.  Infection of the amniotic sac. BEFORE THE PROCEDURE   Do not take aspirin.  Do not eat or drink anything 8 hours before the procedure.  Do not smoke.  If you are being admitted the same day as the procedure, arrive at the hospital at least 60 minutes before the surgery or as directed. During this time, you will sign the necessary forms and get prepared for the surgery.  A waiting area is available for family and friends. PROCEDURE   You will be given an IV (intravenous) and medication to relax you.  You will be given a spinal for anesthesia.  A stitch will be placed in and around the cervix to tighten it and keep it closed. AFTER THE PROCEDURE   You will go to a recovery room where you and the baby are monitored.  Once you are awake, stable, and taking fluids well, barring other problems, you will be allowed to go home  You may get progesterone to prevent uterine contractions and stabilize cervical length.  Have someone drive you home and stay with you for  a day or two.  You may be given medications to take when you go home. HOME CARE INSTRUCTIONS   Only take over-the-counter or prescriptions medicines for pain, discomfort or fever as directed by your caregiver.  Avoid physical activities and exercise until your caregiver says it is okay.  Resume your usual diet.  Do not douche.  Do not have sexual intercourse for one week after procedure  Keep your follow up surgical and prenatal appointments with your caregiver. SEEK MEDICAL CARE IF:   You have abnormal vaginal discharge.  You develop a rash.  You are having problems with your medications.  You become lightheaded or feel faint. SEEK IMMEDIATE MEDICAL CARE IF:   You develop vaginal bleeding.  You are leaking fluid or have a gush of fluid from the vagina.  You develop a temperature of 102 F (38.9 C) or higher.  You pass out.  You have uterine contractions.  You feel the baby is not moving as much as usual or cannot feel the baby move. Document Released: 06/04/2008 Document Revised: 09/14/2011 Document Reviewed: 06/04/2008 [facility] Patient Information 2013 [employer] [place], [place].

## 2018-07-04 NOTE — Progress Notes (Signed)
Faculty Practice OB/GYN Attending Note  Deanna BuffLetitia M Walker is a 37 y.o. G3P0110 at 7827w3d here for surgical management of history of cervical incompetence. Patient is too early for this procedure today, will be rescheduled for two weeks from now (07/19/18 at 9:30 am with Dr. Debroah LoopArnold). This was explained to patient, and she agreed with this plan.  Preoperative instructions given to patient. Follow up in [facility] for prenatal care as scheduled.   Deanna CollinsUGONNA  [name], MD, FACOG Obstetrician & Gynecologist, [facility] for [facility], [facility]

## 2018-07-07 ENCOUNTER — Telehealth (HOSPITAL_COMMUNITY): Payer: Self-pay | Admitting: *Deleted

## 2018-07-07 NOTE — Telephone Encounter (Signed)
Reviewed information regarding rescheduled cerclage.  Verbalized understanding of arriving at 0730 on 1/14.

## 2018-07-13 ENCOUNTER — Ambulatory Visit: Payer: Medicaid Other | Admitting: Skilled Nursing Facility1

## 2018-07-18 ENCOUNTER — Encounter (HOSPITAL_COMMUNITY): Admission: RE | Admit: 2018-07-18 | Payer: Medicaid Other | Source: Ambulatory Visit

## 2018-07-18 ENCOUNTER — Ambulatory Visit (HOSPITAL_COMMUNITY)
Admission: RE | Admit: 2018-07-18 | Discharge: 2018-07-18 | Disposition: A | Discharge status: Home / Self Care | Payer: Medicaid Other | Source: Ambulatory Visit | Attending: Obstetrics and Gynecology | Admitting: Obstetrics and Gynecology

## 2018-07-18 ENCOUNTER — Encounter: Payer: Self-pay | Admitting: Obstetrics and Gynecology

## 2018-07-18 ENCOUNTER — Ambulatory Visit (INDEPENDENT_AMBULATORY_CARE_PROVIDER_SITE_OTHER): Payer: Medicaid Other | Admitting: Obstetrics and Gynecology

## 2018-07-18 VITALS — BP 135/81 | HR 101 | Wt 198.0 lb

## 2018-07-18 DIAGNOSIS — O0990 Supervision of high risk pregnancy, unspecified, unspecified trimester: Secondary | ICD-10-CM | POA: Diagnosis present

## 2018-07-18 DIAGNOSIS — Z3A12 12 weeks gestation of pregnancy: Secondary | ICD-10-CM

## 2018-07-18 DIAGNOSIS — O09299 Supervision of pregnancy with other poor reproductive or obstetric history, unspecified trimester: Secondary | ICD-10-CM

## 2018-07-18 DIAGNOSIS — O10011 Pre-existing essential hypertension complicating pregnancy, first trimester: Secondary | ICD-10-CM | POA: Diagnosis not present

## 2018-07-18 DIAGNOSIS — O09511 Supervision of elderly primigravida, first trimester: Secondary | ICD-10-CM | POA: Diagnosis not present

## 2018-07-18 DIAGNOSIS — O0991 Supervision of high risk pregnancy, unspecified, first trimester: Secondary | ICD-10-CM | POA: Diagnosis not present

## 2018-07-18 DIAGNOSIS — O24419 Gestational diabetes mellitus in pregnancy, unspecified control: Secondary | ICD-10-CM | POA: Diagnosis not present

## 2018-07-18 DIAGNOSIS — O09512 Supervision of elderly primigravida, second trimester: Secondary | ICD-10-CM

## 2018-07-18 DIAGNOSIS — I10 Essential (primary) hypertension: Secondary | ICD-10-CM

## 2018-07-18 DIAGNOSIS — O09291 Supervision of pregnancy with other poor reproductive or obstetric history, first trimester: Secondary | ICD-10-CM

## 2018-07-18 NOTE — Progress Notes (Signed)
Patient informed of results of ultrasound which demonstrated a missed abortion measuring [redacted]w[redacted]d. Emotional support was provided Patient offered medical management with cytotec or surgical evacuation. Patient opted for surgical evacuation with D&E. Information submitted to surgical scheduler  US Ob Less Than 14 Weeks With Ob Transvaginal  Result Date: 07/18/2018 CLINICAL DATA:  38 year old pregnant female presents for evaluation fetal dating and viability. Quantitative beta HCG on 06/10/2018 was 29,199. EDC by LMP: 01/27/2019, projecting to an expected gestational age of [redacted] weeks 3 days. EXAM: OBSTETRIC <14 WK [place] AND TRANSVAGINAL OB US TECHNIQUE: Both transabdominal and transvaginal ultrasound examinations were performed for complete evaluation of the gestation as well as the maternal uterus, adnexal regions, and pelvic cul-de-sac. Transvaginal technique was performed to assess early pregnancy. COMPARISON:  06/10/2018 obstetric scan. FINDINGS: Intrauterine gestational sac: Single intrauterine gestational sac. Yolk sac:  Not Visualized. Fetus:  Visualized. Fetal Cardiac Activity: Not Visualized on M-mode or grayscale cine. CRL:  21.1 mm   8 w   5 d                  [place] EDC: 02/22/2019 Subchorionic hemorrhage:  None visualized. Maternal uterus/adnexae: Anteverted uterus with no fibroids demonstrated. Right ovary measures 4.1 x 2.6 x 3.3 cm. Left ovary measures 3.3 x 1.9 x 2.7 cm. No abnormal ovarian or adnexal masses. IMPRESSION: 1. Single intrauterine gestation at 8 weeks 5 days by crown-rump length. No fetal cardiac activity. Findings are diagnostic of intrauterine fetal demise. 2. No ovarian or adnexal abnormality. These results will be called to the ordering clinician or representative by the Radiology Department at the imaging location. Electronically Signed   By: Delbert Phenix M.D.   On: 07/18/2018 12:29

## 2018-07-18 NOTE — Progress Notes (Signed)
   PRENATAL VISIT NOTE  Subjective:  [name] is a 38 y.o. G3P0110 at 8857w3d being seen today for ongoing prenatal care.  She is currently monitored for the following issues for this high-risk pregnancy and has PCOS (polycystic ovarian syndrome); Prediabetes; Chronic urticaria; Chronic hypertension; History of gestational diabetes in prior pregnancy, currently pregnant; Supervision of high risk pregnancy, antepartum; History of incompetent cervix, currently pregnant; AMA (advanced maternal age) primigravida 35+; and Gestational diabetes mellitus (GDM) affecting pregnancy, antepartum on their problem list.  Patient reports no complaints.  Contractions: Not present. Vag. Bleeding: None.   . Denies leaking of fluid.   The following portions of the patient's history were reviewed and updated as appropriate: allergies, current medications, past family history, past medical history, past social history, past surgical history and problem list. Problem list updated.  Objective:   Vitals:   07/18/18 1032  BP: 135/81  Pulse: (!) 101  Weight: 198 lb (89.8 kg)    Fetal Status:           General:  Alert, oriented and cooperative. Patient is in no acute distress.  Skin: Skin is warm and dry. No rash noted.   Cardiovascular: Normal heart rate noted  Respiratory: Normal respiratory effort, no problems with respiration noted  Abdomen: Soft, gravid, appropriate for gestational age.  Pain/Pressure: Absent     Pelvic: Cervical exam deferred        Extremities: Normal range of motion.     Mental Status: Normal mood and affect. Normal behavior. Normal judgment and thought content.   Assessment and Plan:  Pregnancy: G3P0110 at 2057w3d  1. Supervision of high risk pregnancy, antepartum Patient denies cramping or vaginal bleeding No heart tones audible on doppler. No cardiac activity or clear fetal pole on transabdominal ultrasound (portable ultrasound) Patient sent to radiology for viability  ultrasound. Patient will be contacted with results - US OB LESS THAN 14 WEEKS WITH OB TRANSVAGINAL; Future  2. Gestational diabetes mellitus (GDM) affecting pregnancy, antepartum Patient reports fasting as high as 89 and pp as high as 125  3. Chronic hypertension Stable- no meds  4. Primigravida of advanced maternal age in second trimester   5. History of incompetent cervix, currently pregnant Is scheduled for cerclage placement pending viability ultrasound  Preterm labor symptoms and general obstetric precautions including but not limited to vaginal bleeding, contractions, leaking of fluid and fetal movement were reviewed in detail with the patient. Please refer to After Visit Summary for other counseling recommendations.  No follow-ups on file.  No future appointments.  Deanna AntiguaPeggy Rubylee Zamarripa, MD

## 2018-07-19 ENCOUNTER — Other Ambulatory Visit: Payer: Self-pay | Admitting: Obstetrics and Gynecology

## 2018-07-19 ENCOUNTER — Ambulatory Visit (HOSPITAL_COMMUNITY)
Admission: RE | Admit: 2018-07-19 | Payer: Medicaid Other | Source: Home / Self Care | Admitting: Obstetrics & Gynecology

## 2018-07-19 ENCOUNTER — Encounter (HOSPITAL_COMMUNITY): Admission: RE | Payer: Self-pay | Source: Home / Self Care

## 2018-07-19 ENCOUNTER — Inpatient Hospital Stay (EMERGENCY_DEPARTMENT_HOSPITAL)
Admission: AD | Admit: 2018-07-19 | Discharge: 2018-07-20 | Disposition: A | Discharge status: Home / Self Care | Payer: Medicaid Other | Source: Ambulatory Visit | Attending: Family Medicine | Admitting: Family Medicine

## 2018-07-19 ENCOUNTER — Other Ambulatory Visit: Payer: Self-pay

## 2018-07-19 ENCOUNTER — Encounter (HOSPITAL_COMMUNITY)
Admission: AD | Disposition: A | Discharge status: Home / Self Care | Payer: Self-pay | Source: Home / Self Care | Attending: Obstetrics & Gynecology

## 2018-07-19 ENCOUNTER — Encounter (HOSPITAL_BASED_OUTPATIENT_CLINIC_OR_DEPARTMENT_OTHER): Payer: Self-pay | Admitting: *Deleted

## 2018-07-19 ENCOUNTER — Encounter (HOSPITAL_COMMUNITY): Payer: Self-pay | Admitting: *Deleted

## 2018-07-19 DIAGNOSIS — O99331 Smoking (tobacco) complicating pregnancy, first trimester: Secondary | ICD-10-CM | POA: Diagnosis not present

## 2018-07-19 DIAGNOSIS — F17200 Nicotine dependence, unspecified, uncomplicated: Secondary | ICD-10-CM

## 2018-07-19 DIAGNOSIS — O200 Threatened abortion: Secondary | ICD-10-CM | POA: Diagnosis not present

## 2018-07-19 DIAGNOSIS — Z79899 Other long term (current) drug therapy: Secondary | ICD-10-CM | POA: Diagnosis not present

## 2018-07-19 DIAGNOSIS — O021 Missed abortion: Secondary | ICD-10-CM | POA: Diagnosis not present

## 2018-07-19 DIAGNOSIS — O9989 Other specified diseases and conditions complicating pregnancy, childbirth and the puerperium: Secondary | ICD-10-CM | POA: Diagnosis not present

## 2018-07-19 DIAGNOSIS — Z3A12 12 weeks gestation of pregnancy: Secondary | ICD-10-CM

## 2018-07-19 DIAGNOSIS — R7303 Prediabetes: Secondary | ICD-10-CM | POA: Diagnosis not present

## 2018-07-19 DIAGNOSIS — Z7984 Long term (current) use of oral hypoglycemic drugs: Secondary | ICD-10-CM | POA: Diagnosis not present

## 2018-07-19 SURGERY — CERCLAGE, CERVIX, VAGINAL APPROACH
Anesthesia: Choice

## 2018-07-19 MED ORDER — IBUPROFEN 600 MG PO TABS
600.0000 mg | ORAL_TABLET | Freq: Once | ORAL | Status: AC
  Administered 2018-07-19: 600 mg via ORAL
  Filled 2018-07-19: qty 1

## 2018-07-19 NOTE — Discharge Instructions (Signed)
Incomplete Miscarriage  A miscarriage is the loss of an unborn baby (fetus) before the 20th week of pregnancy. In an incomplete miscarriage, parts of the fetus or placenta (afterbirth) remain in the body. Most miscarriages happen in the first 3 months of pregnancy. Sometimes, it happens before a woman even knows she is pregnant.  Having a miscarriage can be an emotional experience. If you have had a miscarriage, talk with your health care provider about any questions you may have about miscarrying, the grieving process, and your future pregnancy plans.  What are the causes?  This condition may be caused by:  · Problems with the genes or chromosomes that make it impossible for the baby to develop normally. These problems are most often the result of random errors that occur early in development, and are not passed from parent to child (not inherited).  · Infection of the cervix or uterus.  · Conditions that affect hormone balance in the body.  · Problems with the cervix, such as the cervix opening and thinning before pregnancy is at term (cervical insufficiency).  · Problems with the uterus, such as a uterus with an abnormal shape, fibroids in the uterus, or problems that were present from birth (congenital abnormalities).  · Certain medical conditions.  · Smoking, drinking alcohol, or using drugs.  · Injury (trauma).  Many times, the cause of a miscarriage is not known.  What are the signs or symptoms?  Symptoms of this condition include:  · Vaginal bleeding or spotting, with or without cramps or pain.  · Pain or cramping in the abdomen or lower back.  · Passing fluid, tissue, or blood clots from the vagina.  How is this diagnosed?  This condition may be diagnosed based on:  · A physical exam.  · Ultrasound.  · Blood tests.  · Urine tests.  How is this treated?  An incomplete miscarriage may be treated with:  · Dilation and curettage (D&C). This is a procedure in which the cervix is stretched open and the lining of  the uterus (endometrium) is scraped to remove any remaining tissue from the pregnancy.  · Medicines, such as:  ? Antibiotic medicine to treat infection.  ? Medicine to help any remaining tissue pass out of your uterus.  ? Medicine to reduce (contract) the size of the uterus. These medicines may be given if you have a lot of bleeding.  If you have Rh negative blood and your baby was Rh positive, you will need a shot of medicine called Rh immunoglobulinto protect future babies from Rh blood problems. "Rh-negative" and "Rh-positive" refer to whether or not the blood has a specific protein found on the surface of red blood cells (Rh factor).  Follow these instructions at home:  Medicines    · Take over-the-counter and prescription medicines only as told by your health care provider.  · If you were prescribed antibiotic medicine, take your antibiotic as told by your health care provider. Do not stop taking the antibiotic even if you start to feel better.  · Do not take NSAIDs, such as aspirin and ibuprofen, unless approved by your doctor. These medicines can cause bleeding.  Activity  · Rest as directed. Ask your health care provider what activities are safe for you.  · Have someone help with home and family responsibilities during this time.  General instructions  · Keep track of the number of sanitary pads you use each day and how soaked (saturated) they are. Write down this   information.  · Monitor the amount of tissue or blood clots that you pass from your vagina. Save any large amounts of tissue for your health care provider to examine.  · Do not use tampons, douche, or have sex until your health care provider approves.  · To help you and your partner with the process of grieving, talk with your health care provider or seek counseling to help cope with the pregnancy loss.  · When you are ready, meet with your health care provider to discuss important steps you should take for your health, as well as steps to take in  order to have a healthy pregnancy in the future.  · Keep all follow-up visits as told by your health care provider. This is important.  Where to find more information  · The American Congress of Obstetricians and Gynecologists: www.acog.org  · U.[place]. Department of Health and Human Services Office of Women’s Health: www.womenshealth.gov  Contact a health care provider if:  · You have a fever or chills.  · You have a foul smelling vaginal discharge.  Get help right away if:  · You have severe cramps or pain in your back or abdomen.  · You pass walnut-sized (or larger) blood clots or tissue from your vagina.  · You have heavy bleeding, soaking more than 1 regular sanitary pad in an hour.  · You become lightheaded or weak.  · You pass out.  · You have feelings of sadness that take over your thoughts, or you have thoughts of hurting yourself.  Summary  · In an incomplete miscarriage, parts of the fetus or placenta (afterbirth) remain in the body.  · There are multiple treatment options for an incomplete miscarriage, talk to your health care provider about the best option for you.  · Follow your health care provider's instructions for follow-up care.  · To help you and your partner with the process of grieving, talk with your health care provider or seek counseling to help cope with the pregnancy loss.  This information is not intended to replace advice given to you by your health care provider. Make sure you discuss any questions you have with your health care provider.  Document Released: 06/22/2005 Document Revised: 07/29/2016 Document Reviewed: 07/29/2016  Elsevier Interactive Patient Education © 2019 Elsevier Inc[employer]

## 2018-07-19 NOTE — MAU Provider Note (Signed)
Chief Complaint: Vaginal Bleeding   First Provider Initiated Contact with Patient 07/19/18 2308        SUBJECTIVE HPI: Deanna Walker is a 39 y.o. G3P0110 at 12w4dby LMP who presents to maternity admissions reporting new onset of vaginal bleeding tonight.  It was small amount but it worried her.  Has some light cramping also.  [facility] a [facility] It is scheduled for the morning at 1000. .Marland KitchenShe denies vaginal itching/burning, urinary symptoms, h/a, dizziness, n/v, or fever/chills.    Vaginal Bleeding  The patient's primary symptoms include pelvic pain and vaginal bleeding. The patient's pertinent negatives include no genital itching, genital lesions or genital odor. This is a new problem. The current episode started today. The problem occurs intermittently. She is pregnant. Associated symptoms include abdominal pain. Pertinent negatives include no back pain, chills, constipation, diarrhea, fever, nausea or vomiting. The vaginal discharge was bloody. The vaginal bleeding is spotting. She has not been passing clots. She has not been passing tissue. Nothing aggravates the symptoms. She has tried nothing for the symptoms.   RN Note: [place]  [place]  [facility] -  TONIGHT IN SHOWER -  STARTED BLEEDING -  WITH MILD CRAMPS.   PAD ON IN TRIAGE -   SMALL AMT LIGHT  RED.    Past Medical History:  Diagnosis Date  . Abnormal Pap smear 2011   colpo and biopsy done, normal paps since  . Chronic urticaria   . Depression   . History of cardiac murmur as a child    infant  . History of cervical incompetence   . History of gestational diabetes   . Infertility associated with anovulation 01/24/2013  . PCOS (polycystic ovarian syndrome)   . Perennial and seasonal allergic rhinoconjunctivitis 09/25/2015  . Pre-diabetes   . Pregnancy induced hypertension    07-19-2018 per pt watching diet ,  no meds   Past Surgical History:  Procedure Laterality  Date  . COLPOSCOPY    . WISDOM TOOTH EXTRACTION  2008  approx.   Social History   Socioeconomic History  . Marital status: Single    Spouse name: Not on file  . Number of children: Not on file  . Years of education: Not on file  . Highest education level: Not on file  Occupational History  . Not on file  Social Needs  . Financial resource strain: Not hard at all  . Food insecurity:    Worry: Never true    Inability: Never true  . Transportation needs:    Medical: No    Non-medical: Not on file  Tobacco Use  . Smoking status: Current Some Day Smoker    Years: 2.00  . Smokeless tobacco: Never Used  . Tobacco comment: 07-19-2017 prior to current pregnancy ,  occasional cigeratte  Substance and Sexual Activity  . Alcohol use: Not Currently    Alcohol/week: 0.0 standard drinks    Comment: occasionally   . Drug use: Not Currently  . Sexual activity: Yes    Partners: Male    Birth control/protection: None  Lifestyle  . Physical activity:    Days per week: Not on file    Minutes per session: Not on file  . Stress: To some extent  Relationships  . Social connections:    Talks on phone: Not on file    Gets together: Not on file    Attends religious  service: Not on file    Active member of club or organization: Not on file    Attends meetings of clubs or organizations: Not on file    Relationship status: Not on file  . Intimate partner violence:    Fear of current or ex partner: No    Emotionally abused: No    Physically abused: No    Forced sexual activity: No  Other Topics Concern  . Not on file  Social History Narrative  . Not on file   No current facility-administered medications on file prior to encounter.    Current Outpatient Medications on File Prior to Encounter  Medication Sig Dispense Refill  . metFORMIN (GLUCOPHAGE) 500 MG tablet Take 1 tablet (500 mg total) by mouth 2 (two) times daily with a meal. 60 tablet 12  . prenatal vitamin w/FE, FA (PRENATAL 1  + 1) 27-1 MG TABS tablet Take 1 tablet by mouth daily at 12 noon.    Marland Kitchen ACCU-CHEK FASTCLIX LANCETS MISC 1 Device by Percutaneous route 4 (four) times daily. 100 each 12  . Blood Glucose Monitoring Suppl (ACCU-CHEK GUIDE ME) w/Device KIT 1 kit by Does not apply route 4 (four) times daily. 1 kit 0  . cetirizine (ZYRTEC) 10 MG tablet Take 10 mg by mouth daily as needed.   0  . glucose blood test strip Use as instructed 100 each 12  . vitamin E 100 UNIT capsule Take 100 Units by mouth daily.      No Known Allergies  I have reviewed patient's Past Medical Hx, Surgical Hx, Family Hx, Social Hx, medications and allergies.   ROS:  Review of Systems  Constitutional: Negative for chills and fever.  Gastrointestinal: Positive for abdominal pain. Negative for constipation, diarrhea, nausea and vomiting.  Genitourinary: Positive for pelvic pain and vaginal bleeding.  Musculoskeletal: Negative for back pain.   Review of Systems  Other systems negative   Physical Exam  Physical Exam Patient Vitals for the past 24 hrs:  BP Temp Temp src Pulse Resp Height Weight  07/19/18 2224 118/68 98.9 F (37.2 C) Oral (!) 103 20 _0  (1.651 m) 91.2 kg   Constitutional: Well-developed, well-nourished female in no acute distress.  Cardiovascular: normal rate Respiratory: normal effort GI: Abd soft, non-tender. Pos BS x 4 MS: Extremities nontender, no edema, normal ROM Neurologic: Alert and oriented x 4.  GU: Neg CVAT.  PELVIC EXAM: Cervix pink, visually closed, without lesion, small red discharge, vaginal walls and external genitalia normal  LAB RESULTS No results found for this or any previous visit (from the past 72 hour(s)).   B/Positive/-- (12/16 1455)  IMAGING US Ob Less Than 14 Weeks With Ob Transvaginal  Result Date: 07/18/2018 CLINICAL DATA:  38 year old pregnant female presents for evaluation fetal dating and viability. Quantitative beta HCG on 06/10/2018 was 29,199. EDC by LMP: 01/27/2019,  projecting to an expected gestational age of [redacted] weeks 3 days. EXAM: OBSTETRIC <14 WK [place] AND TRANSVAGINAL OB US TECHNIQUE: Both transabdominal and transvaginal ultrasound examinations were performed for complete evaluation of the gestation as well as the maternal uterus, adnexal regions, and pelvic cul-de-sac. Transvaginal technique was performed to assess early pregnancy. COMPARISON:  06/10/2018 obstetric scan. FINDINGS: Intrauterine gestational sac: Single intrauterine gestational sac. Yolk sac:  Not Visualized. Fetus:  Visualized. Fetal Cardiac Activity: Not Visualized on M-mode or grayscale cine. CRL:  21.1 mm   8 w   5 d  [place] EDC: 02/22/2019 Subchorionic hemorrhage:  None visualized. Maternal uterus/adnexae: Anteverted uterus with no fibroids demonstrated. Right ovary measures 4.1 x 2.6 x 3.3 cm. Left ovary measures 3.3 x 1.9 x 2.7 cm. No abnormal ovarian or adnexal masses. IMPRESSION: 1. Single intrauterine gestation at 8 weeks 5 days by crown-rump length. No fetal cardiac activity. Findings are diagnostic of intrauterine fetal demise. 2. No ovarian or adnexal abnormality. These results will be called to the ordering clinician or representative by the Radiology Department at the imaging location. Electronically Signed   By: Ilona Sorrel M.D.   On: 07/18/2018 12:29    MAU Management/MDM: Ibuprofen given for cramping with good relief Consulted Dr Nehemiah Settle who agrees patient should proceed expectantly and continue plan for D&C in the morning.   If bleeding increases tonight, may return.    ASSESSMENT Pregnancy at 70w5dMissed abortion Vaginal bleeding, likely due to above  PLAN Discharge home Expectant management Keep appointment to arrive at hospital at 0800.  Pt stable at time of discharge. Encouraged to return here or to other Urgent Care/ED if she develops worsening of symptoms, increase in pain, fever, or other concerning symptoms.    MHansel FeinsteinCNM, MSN Certified  Nurse-Midwife 07/19/2018  11:08 PM

## 2018-07-19 NOTE — MAU Note (Signed)
PT SAYS  SHE IS [place]   AT [facility] -  TONIGHT IN SHOWER -  STARTED BLEEDING -  WITH MILD CRAMPS.   PAD ON IN TRIAGE -   SMALL AMT LIGHT  RED.

## 2018-07-19 NOTE — Progress Notes (Signed)
Spoke w/ pt via phone for pre-op interview.  Npo after mn.  Arrive at 0800.  Needs istat and ekg.  May take zyrtec if needed am dos w/ sips of water.

## 2018-07-20 ENCOUNTER — Encounter (HOSPITAL_BASED_OUTPATIENT_CLINIC_OR_DEPARTMENT_OTHER)
Admission: RE | Disposition: A | Discharge status: Home / Self Care | Payer: Self-pay | Source: Home / Self Care | Attending: Obstetrics and Gynecology

## 2018-07-20 ENCOUNTER — Ambulatory Visit (HOSPITAL_BASED_OUTPATIENT_CLINIC_OR_DEPARTMENT_OTHER): Payer: Medicaid Other | Admitting: Anesthesiology

## 2018-07-20 ENCOUNTER — Ambulatory Visit (HOSPITAL_BASED_OUTPATIENT_CLINIC_OR_DEPARTMENT_OTHER)
Admission: RE | Admit: 2018-07-20 | Discharge: 2018-07-20 | Disposition: A | Discharge status: Home / Self Care | Payer: Medicaid Other | Attending: Obstetrics and Gynecology | Admitting: Obstetrics and Gynecology

## 2018-07-20 ENCOUNTER — Other Ambulatory Visit: Payer: Self-pay

## 2018-07-20 ENCOUNTER — Encounter (HOSPITAL_BASED_OUTPATIENT_CLINIC_OR_DEPARTMENT_OTHER): Payer: Self-pay

## 2018-07-20 DIAGNOSIS — O99331 Smoking (tobacco) complicating pregnancy, first trimester: Secondary | ICD-10-CM | POA: Insufficient documentation

## 2018-07-20 DIAGNOSIS — O9989 Other specified diseases and conditions complicating pregnancy, childbirth and the puerperium: Secondary | ICD-10-CM | POA: Insufficient documentation

## 2018-07-20 DIAGNOSIS — R7303 Prediabetes: Secondary | ICD-10-CM | POA: Insufficient documentation

## 2018-07-20 DIAGNOSIS — Z3A12 12 weeks gestation of pregnancy: Secondary | ICD-10-CM | POA: Insufficient documentation

## 2018-07-20 DIAGNOSIS — O200 Threatened abortion: Secondary | ICD-10-CM | POA: Insufficient documentation

## 2018-07-20 DIAGNOSIS — Z9889 Other specified postprocedural states: Secondary | ICD-10-CM

## 2018-07-20 DIAGNOSIS — F17200 Nicotine dependence, unspecified, uncomplicated: Secondary | ICD-10-CM | POA: Diagnosis not present

## 2018-07-20 DIAGNOSIS — Z79899 Other long term (current) drug therapy: Secondary | ICD-10-CM | POA: Insufficient documentation

## 2018-07-20 DIAGNOSIS — Z7984 Long term (current) use of oral hypoglycemic drugs: Secondary | ICD-10-CM | POA: Insufficient documentation

## 2018-07-20 DIAGNOSIS — O021 Missed abortion: Secondary | ICD-10-CM

## 2018-07-20 HISTORY — PX: DILATION AND EVACUATION: SHX1459

## 2018-07-20 HISTORY — DX: Gestational (pregnancy-induced) hypertension without significant proteinuria, unspecified trimester: O13.9

## 2018-07-20 HISTORY — DX: Other urticaria: L50.8

## 2018-07-20 HISTORY — DX: Personal history of other complications of pregnancy, childbirth and the puerperium: Z87.59

## 2018-07-20 HISTORY — DX: Personal history of other specified conditions: Z87.898

## 2018-07-20 HISTORY — DX: Prediabetes: R73.03

## 2018-07-20 HISTORY — DX: Personal history of gestational diabetes: Z86.32

## 2018-07-20 HISTORY — DX: Personal history of other diseases of the female genital tract: Z87.42

## 2018-07-20 LAB — POCT I-STAT 4, (NA,K, GLUC, HGB,HCT)
Glucose, Bld: 99 mg/dL (ref 70–99)
HCT: 43 % (ref 36.0–46.0)
Hemoglobin: 14.6 g/dL (ref 12.0–15.0)
POTASSIUM: 4.3 mmol/L (ref 3.5–5.1)
Sodium: 140 mmol/L (ref 135–145)

## 2018-07-20 SURGERY — DILATION AND EVACUATION, UTERUS
Anesthesia: General | Site: Vagina

## 2018-07-20 MED ORDER — ACETAMINOPHEN 325 MG PO TABS
325.0000 mg | ORAL_TABLET | ORAL | Status: DC | PRN
  Filled 2018-07-20: qty 2

## 2018-07-20 MED ORDER — IBUPROFEN 600 MG PO TABS: 600.0000 mg | ORAL_TABLET | Freq: Four times a day (QID) | ORAL | 3 refills | Status: DC | PRN

## 2018-07-20 MED ORDER — ONDANSETRON HCL 4 MG/2ML IJ SOLN
INTRAMUSCULAR | Status: AC
  Filled 2018-07-20: qty 2

## 2018-07-20 MED ORDER — FENTANYL CITRATE (PF) 100 MCG/2ML IJ SOLN
25.0000 ug | INTRAMUSCULAR | Status: DC | PRN
  Filled 2018-07-20: qty 1

## 2018-07-20 MED ORDER — PROPOFOL 10 MG/ML IV BOLUS
INTRAVENOUS | Status: DC | PRN
  Administered 2018-07-20: 200 mg via INTRAVENOUS

## 2018-07-20 MED ORDER — DEXAMETHASONE SODIUM PHOSPHATE 4 MG/ML IJ SOLN
INTRAMUSCULAR | Status: DC | PRN
  Administered 2018-07-20: 10 mg via INTRAVENOUS

## 2018-07-20 MED ORDER — LIDOCAINE HCL 1 % IJ SOLN
INTRAMUSCULAR | Status: DC | PRN
  Administered 2018-07-20: 20 mL

## 2018-07-20 MED ORDER — LIDOCAINE 2% (20 MG/ML) 5 ML SYRINGE
INTRAMUSCULAR | Status: AC
  Filled 2018-07-20: qty 5

## 2018-07-20 MED ORDER — MIDAZOLAM HCL 2 MG/2ML IJ SOLN
INTRAMUSCULAR | Status: AC
  Filled 2018-07-20: qty 2

## 2018-07-20 MED ORDER — ONDANSETRON HCL 4 MG/2ML IJ SOLN
4.0000 mg | Freq: Once | INTRAMUSCULAR | Status: DC | PRN
  Filled 2018-07-20: qty 2

## 2018-07-20 MED ORDER — OXYCODONE HCL 5 MG PO TABS
5.0000 mg | ORAL_TABLET | Freq: Once | ORAL | Status: DC | PRN
  Filled 2018-07-20: qty 1

## 2018-07-20 MED ORDER — MEPERIDINE HCL 25 MG/ML IJ SOLN
6.2500 mg | INTRAMUSCULAR | Status: DC | PRN
  Filled 2018-07-20: qty 1

## 2018-07-20 MED ORDER — SODIUM CHLORIDE 0.9 % IV SOLN
100.0000 mg | Freq: Once | INTRAVENOUS | Status: AC
  Administered 2018-07-20: 100 mg via INTRAVENOUS
  Filled 2018-07-20 (×2): qty 100

## 2018-07-20 MED ORDER — DEXAMETHASONE SODIUM PHOSPHATE 10 MG/ML IJ SOLN
INTRAMUSCULAR | Status: AC
  Filled 2018-07-20: qty 1

## 2018-07-20 MED ORDER — ONDANSETRON HCL 4 MG/2ML IJ SOLN
INTRAMUSCULAR | Status: DC | PRN
  Administered 2018-07-20: 4 mg via INTRAVENOUS

## 2018-07-20 MED ORDER — OXYCODONE HCL 5 MG/5ML PO SOLN
5.0000 mg | Freq: Once | ORAL | Status: DC | PRN
  Filled 2018-07-20: qty 5

## 2018-07-20 MED ORDER — ACETAMINOPHEN 160 MG/5ML PO SOLN
325.0000 mg | ORAL | Status: DC | PRN
  Filled 2018-07-20: qty 20.3

## 2018-07-20 MED ORDER — ACETAMINOPHEN 500 MG PO TABS: 500.0000 mg | ORAL_TABLET | Freq: Four times a day (QID) | ORAL | 0 refills | Status: AC | PRN

## 2018-07-20 MED ORDER — PROPOFOL 10 MG/ML IV BOLUS
INTRAVENOUS | Status: AC
  Filled 2018-07-20: qty 20

## 2018-07-20 MED ORDER — FENTANYL CITRATE (PF) 100 MCG/2ML IJ SOLN
INTRAMUSCULAR | Status: DC | PRN
  Administered 2018-07-20: 50 ug via INTRAVENOUS

## 2018-07-20 MED ORDER — SILVER NITRATE-POT NITRATE 75-25 % EX MISC
CUTANEOUS | Status: DC | PRN
  Administered 2018-07-20: 2

## 2018-07-20 MED ORDER — LACTATED RINGERS IV SOLN
INTRAVENOUS | Status: DC
  Administered 2018-07-20: 09:00:00 via INTRAVENOUS
  Filled 2018-07-20: qty 1000

## 2018-07-20 MED ORDER — MIDAZOLAM HCL 5 MG/5ML IJ SOLN
INTRAMUSCULAR | Status: DC | PRN
  Administered 2018-07-20: 2 mg via INTRAVENOUS

## 2018-07-20 MED ORDER — FENTANYL CITRATE (PF) 100 MCG/2ML IJ SOLN
INTRAMUSCULAR | Status: AC
  Filled 2018-07-20: qty 2

## 2018-07-20 MED ORDER — KETOROLAC TROMETHAMINE 30 MG/ML IJ SOLN
INTRAMUSCULAR | Status: AC
  Filled 2018-07-20: qty 1

## 2018-07-20 MED ORDER — LIDOCAINE 2% (20 MG/ML) 5 ML SYRINGE
INTRAMUSCULAR | Status: DC | PRN
  Administered 2018-07-20: 100 mg via INTRAVENOUS

## 2018-07-20 MED ORDER — SOD CITRATE-CITRIC ACID 500-334 MG/5ML PO SOLN
30.0000 mL | ORAL | Status: AC
  Administered 2018-07-20: 30 mL via ORAL
  Filled 2018-07-20: qty 30

## 2018-07-20 MED ORDER — SOD CITRATE-CITRIC ACID 500-334 MG/5ML PO SOLN
ORAL | Status: AC
  Filled 2018-07-20: qty 30

## 2018-07-20 MED ORDER — LACTATED RINGERS IV SOLN
INTRAVENOUS | Status: DC
  Filled 2018-07-20: qty 1000

## 2018-07-20 SURGICAL SUPPLY — 24 items
CATH ROBINSON RED A/P 16FR (CATHETERS) IMPLANT
DECANTER SPIKE VIAL GLASS SM (MISCELLANEOUS) ×3 IMPLANT
GLOVE BIOGEL PI IND STRL 7.0 (GLOVE) ×1 IMPLANT
GLOVE BIOGEL PI IND STRL 7.5 (GLOVE) ×1 IMPLANT
GLOVE BIOGEL PI IND STRL 8.5 (GLOVE) IMPLANT
GLOVE BIOGEL PI INDICATOR 7.0 (GLOVE) ×2
GLOVE BIOGEL PI INDICATOR 7.5 (GLOVE) ×4
GLOVE BIOGEL PI INDICATOR 8.5 (GLOVE) ×2
GLOVE INDICATOR 8.5 STRL (GLOVE) ×2 IMPLANT
GLOVE NEODERM STER SZ 7 (GLOVE) ×3 IMPLANT
GOWN STRL REUS W/TWL LRG LVL3 (GOWN DISPOSABLE) ×5 IMPLANT
GOWN STRL REUS W/TWL XL LVL3 (GOWN DISPOSABLE) ×3 IMPLANT
HIBICLENS CHG 4% 4OZ BTL (MISCELLANEOUS) ×3 IMPLANT
KIT BERKELEY 1ST TRIMESTER 3/8 (MISCELLANEOUS) ×3 IMPLANT
NS IRRIG 1000ML POUR BTL (IV SOLUTION) ×3 IMPLANT
PACK VAGINAL MINOR WOMEN LF (CUSTOM PROCEDURE TRAY) ×3 IMPLANT
PAD OB MATERNITY 4.3X12.25 (PERSONAL CARE ITEMS) ×3 IMPLANT
PAD PREP 24X48 CUFFED NSTRL (MISCELLANEOUS) ×3 IMPLANT
SET BERKELEY SUCTION TUBING (SUCTIONS) ×3 IMPLANT
TOWEL OR 17X24 6PK STRL BLUE (TOWEL DISPOSABLE) ×6 IMPLANT
VACURETTE 10 RIGID CVD (CANNULA) ×2 IMPLANT
VACURETTE 7MM CVD STRL WRAP (CANNULA) IMPLANT
VACURETTE 8 RIGID CVD (CANNULA) IMPLANT
VACURETTE 9 RIGID CVD (CANNULA) ×2 IMPLANT

## 2018-07-20 NOTE — Op Note (Signed)
Operative Note   07/20/2018  PRE-OP DIAGNOSIS: Threatened abortion at 8wks based on CRL. ?septic abortion based on pre operative temperature of 38.2   POST-OP DIAGNOSIS: Same  SURGEON: Surgeon(s) and Role:    * Geneva Bing, MD - Primary  ASSISTANT: None  PROCEDURE:  Suction dilation and curettage  ANESTHESIA: LMA and paracervical block  ESTIMATED BLOOD LOSS:  DRAINS: none  TOTAL IV FLUIDS: crystalloid  SPECIMENS: products of conception to pathology  VTE PROPHYLAXIS: SCDs to the bilateral lower extremities  ANTIBIOTICS: Doxycycline 100mg  IV x 1 pre op  COMPLICATIONS: none  DISPOSITION: PACU - hemodynamically stable.  CONDITION: stable  BLOOD TYPE: B positive Rhogam given:not applicable  FINDINGS: Exam under anesthesia revealed 8 week sized uterus with no masses and bilateral adnexa without masses or fullness. Necrotic appearing products of conception were seen, with gritty texture in all four quadrants.  PROCEDURE IN DETAIL:  After informed consent was obtained, the patient was taken to the operating room where anesthesia was obtained without difficulty. The patient was positioned in the dorsal lithotomy position in [place] stirrups. The patient was examined under anesthesia, with the above noted findings.  The bi-valved speculum was placed inside the patient's vagina, and the the anterior lip of the cervix was seen and grasped with the tenaculum.  A paracervical block was achieved with 28mL of 1% lidocaine and then the cervix was already dilated to pass a #10 cannula.  The suction was then calibrated to and connected to the number 10 cannula, which was then introduced with the above noted findings. A gentle curettage was done at the end and yield no products of conception.   The suction was then done one more time to remove any remaining curettage material.   Excellent hemostasis was noted, and all instruments were removed, with excellent hemostasis noted  throughout.  She was then taken out of dorsal lithotomy. The patient tolerated the procedure well.  Sponge, lap and instrument counts were correct x2.  The patient was taken to recovery room in excellent condition.  Cornelia Copa MD Attending Center for [employer] Midwife)

## 2018-07-20 NOTE — H&P (Signed)
Obstetrics & Gynecology H&P   Date of Admission: 07/20/2018   Requesting Provider: Dr. Mora Bellman  Primary OBGYN: [facility] Primary Care Provider: Kristie Cowman  Reason for Admission: scheduled d&c  History of Present Illness: Deanna Walker is a 38 y.o. G3P0 (Patient's last menstrual period was 04/22/2018.), with the above CC. PMHx is significant for AMA, cHTN, [id number].  Had 1/13 8wk mised AB based on CRL, dx on 1/13. Pt desired surgical management as soon as possible. No subjective fevers, chills, nausea, vomiting. Just spotting now; pt went to [facility] last night for small amount of VB. No blood work or u/s done, exam negative.    ROS: A 12-point review of systems was performed and negative, except as stated in the above HPI.  OBGYN History: As per HPI. OB History  Gravida Para Term Preterm AB Living  3 1   1 1  0  SAB TAB Ectopic Multiple Live Births      1   1    # Outcome Date GA Lbr Len/2nd Weight Sex Delivery Anes PTL Lv  3 Current           2 Ectopic 10/2017          1 Preterm 05/02/16 [redacted]w[redacted]d/ 00:07 450 g F Vag-Spont None  ND     Birth Comments: [place]andel [place] born alive and went to NICU but died within weeks afterward     Complications: Neonatal death    Past Medical History: Past Medical History:  Diagnosis Date  . Abnormal Pap smear 2011   colpo and biopsy done, normal paps since  . Chronic urticaria   . Depression   . History of cardiac murmur as a child    infant  . History of cervical incompetence   . History of gestational diabetes   . Infertility associated with anovulation 01/24/2013  . PCOS (polycystic ovarian syndrome)   . Perennial and seasonal allergic rhinoconjunctivitis 09/25/2015  . Pre-diabetes   . Pregnancy induced hypertension    07-19-2018 per pt watching diet ,  no meds    Past Surgical History: Past Surgical History:  Procedure Laterality Date  . COLPOSCOPY    . WISDOM TOOTH EXTRACTION  2008  approx.    Family History:  Family History  Problem  Relation Age of Onset  . Diabetes Maternal Aunt   . Hypertension Maternal Aunt   . Seizures Mother   . Allergic rhinitis Neg Hx   . Angioedema Neg Hx   . Asthma Neg Hx   . Atopy Neg Hx   . Eczema Neg Hx   . Immunodeficiency Neg Hx   . Urticaria Neg Hx   . Cystic fibrosis Neg Hx   . Lupus Neg Hx   . Emphysema Neg Hx   . Migraines Neg Hx     Social History:  Social History   Socioeconomic History  . Marital status: Single    Spouse name: Not on file  . Number of children: Not on file  . Years of education: Not on file  . Highest education level: Not on file  Occupational History  . Not on file  Social Needs  . Financial resource strain: Not hard at all  . Food insecurity:    Worry: Never true    Inability: Never true  . Transportation needs:    Medical: No    Non-medical: Not on file  Tobacco Use  . Smoking status: Current Some Day Smoker    Years: 2.00  . Smokeless  tobacco: Never Used  . Tobacco comment: 07-19-2017 prior to current pregnancy ,  occasional cigeratte  Substance and Sexual Activity  . Alcohol use: Not Currently    Alcohol/week: 0.0 standard drinks    Comment: occasionally   . Drug use: Not Currently  . Sexual activity: Yes    Partners: Male    Birth control/protection: None  Lifestyle  . Physical activity:    Days per week: Not on file    Minutes per session: Not on file  . Stress: To some extent  Relationships  . Social connections:    Talks on phone: Not on file    Gets together: Not on file    Attends religious service: Not on file    Active member of club or organization: Not on file    Attends meetings of clubs or organizations: Not on file    Relationship status: Not on file  . Intimate partner violence:    Fear of current or ex partner: No    Emotionally abused: No    Physically abused: No    Forced sexual activity: No  Other Topics Concern  . Not on file  Social History Narrative  . Not on file     Allergy: No Known  Allergies  Current Outpatient Medications: Medications Prior to Admission  Medication Sig Dispense Refill Last Dose  . cetirizine (ZYRTEC) 10 MG tablet Take 10 mg by mouth daily as needed.   0 07/18/2018  . metFORMIN (GLUCOPHAGE) 500 MG tablet Take 1 tablet (500 mg total) by mouth 2 (two) times daily with a meal. 60 tablet 12 07/19/2018 at Unknown time  . prenatal vitamin w/FE, FA (PRENATAL 1 + 1) 27-1 MG TABS tablet Take 1 tablet by mouth daily at 12 noon.   07/19/2018 at Unknown time  . ACCU-CHEK FASTCLIX LANCETS MISC 1 Device by Percutaneous route 4 (four) times daily. 100 each 12 07/18/2018  . Blood Glucose Monitoring Suppl (ACCU-CHEK GUIDE ME) w/Device KIT 1 kit by Does not apply route 4 (four) times daily. 1 kit 0 07/18/2018  . glucose blood test strip Use as instructed 100 each 12 07/18/2018  . vitamin E 100 UNIT capsule Take 100 Units by mouth daily.    07/16/2018     Hospital Medications: Current Facility-Administered Medications  Medication Dose Route Frequency Provider Last Rate Last Dose  . doxycycline (VIBRAMYCIN) 100 mg in sodium chloride 0.9 % 250 mL IVPB  100 mg Intravenous Once Aletha Halim, MD      . lactated ringers infusion   Intravenous Continuous Janeece Riggers, MD 50 mL/hr at 07/20/18 [phone]    . lactated ringers infusion   Intravenous Continuous Aletha Halim, MD         Physical Exam:  Current Vital Signs 24h Vital Sign Ranges  T (!) 100.7 F (38.2 C) Temp  Avg: 99.8 F (37.7 C)  Min: 98.9 F (37.2 C)  Max: 100.7 F (38.2 C)  BP 129/78 BP  Min: 118/68  Max: 129/78  HR 80 Pulse  Avg: 88.3  Min: 80  Max: 103  RR 16 Resp  Avg: 18  Min: 16  Max: 20  SaO2 100 % Room Air SpO2  Avg: 100 %  Min: 100 %  Max: 100 %       24 Hour I/O Current Shift I/O  Time Ins Outs No intake/output data recorded. No intake/output data recorded.    Body mass index is 33.25 kg/m. General appearance: Well nourished, well developed female in  no acute distress.  Cardiovascular:  S1, S2 normal, no murmur, rub or gallop, regular rate and rhythm Respiratory:  Clear to auscultation bilateral. Normal respiratory effort Abdomen: positive bowel sounds and no masses, hernias; diffusely non tender to palpation, non distended Neuro/Psych:  Normal mood and affect.  Skin:  Warm and dry.  Extremities: no clubbing, cyanosis, or edema.   Laboratory:  Recent Labs  Lab 07/20/18 0844  HGB 14.6  HCT 43.0   Recent Labs  Lab 07/20/18 0844  NA 140  K 4.3  GLUCOSE 99    Imaging:  CLINICAL DATA:  38 year old pregnant female presents for evaluation fetal dating and viability. Quantitative beta HCG on 06/10/2018 was 29,199.  EDC by LMP: 01/27/2019, projecting to an expected gestational age of [redacted] weeks 3 days.  EXAM: OBSTETRIC <14 WK [place] AND TRANSVAGINAL OB US  TECHNIQUE: Both transabdominal and transvaginal ultrasound examinations were performed for complete evaluation of the gestation as well as the maternal uterus, adnexal regions, and pelvic cul-de-sac. Transvaginal technique was performed to assess early pregnancy.  COMPARISON:  06/10/2018 obstetric scan.  FINDINGS: Intrauterine gestational sac: Single intrauterine gestational sac.  Yolk sac:  Not Visualized.  Fetus:  Visualized.  Fetal Cardiac Activity: Not Visualized on M-mode or grayscale cine.  CRL:  21.1 mm   8 w   5 d                  [place] EDC: 02/22/2019  Subchorionic hemorrhage:  None visualized.  Maternal uterus/adnexae: Anteverted uterus with no fibroids demonstrated. Right ovary measures 4.1 x 2.6 x 3.3 cm. Left ovary measures 3.3 x 1.9 x 2.7 cm. No abnormal ovarian or adnexal masses.  IMPRESSION: 1. Single intrauterine gestation at 8 weeks 5 days by crown-rump length. No fetal cardiac activity. Findings are diagnostic of intrauterine fetal demise. 2. No ovarian or adnexal abnormality.  These results will be called to the ordering clinician or representative by the Radiology  Department at the imaging location.   Electronically Signed   By: Ilona Sorrel M.D.   On: 07/18/2018 12:29  Assessment: Deanna Walker is a 38 y.o. G3P0 (Patient's last menstrual period was 04/22/2018.) with threatened AB. Pt stable Plan: Continue with plan for d&c. ?brewing septic ab but no need for change in plan of care. Should still be fine for d/c to home after procedure.  Durene Romans MD Attending Center for [facility])

## 2018-07-20 NOTE — Anesthesia Preprocedure Evaluation (Signed)
Anesthesia Evaluation  Patient identified by MRN, date of birth, ID band Patient awake    Reviewed: Allergy & Precautions, H&P , NPO status , Patient's Chart, lab work & pertinent test results, reviewed documented beta blocker date and time   Airway Mallampati: II  TM Distance: >3 FB Neck ROM: full    Dental no notable dental hx.    Pulmonary neg pulmonary ROS, Current Smoker,    Pulmonary exam normal breath sounds clear to auscultation       Cardiovascular Exercise Tolerance: Good hypertension, negative cardio ROS   Rhythm:regular Rate:Normal     Neuro/Psych negative neurological ROS  negative psych ROS   GI/Hepatic negative GI ROS, Neg liver ROS,   Endo/Other  diabetes, Oral Hypoglycemic AgentsPCOS (polycystic ovarian syndrome) Prediabetes    Renal/GU negative Renal ROS  negative genitourinary   Musculoskeletal   Abdominal   Peds  Hematology negative hematology ROS (+)   Anesthesia Other Findings   Reproductive/Obstetrics negative OB ROS                             Anesthesia Physical Anesthesia Plan  ASA: II  Anesthesia Plan: General   Post-op Pain Management:    Induction: Intravenous  PONV Risk Score and Plan: 3 and Ondansetron, Treatment may vary due to age or medical condition and Dexamethasone  Airway Management Planned: LMA  Additional Equipment:   Intra-op Plan:   Post-operative Plan: Extubation in OR  Informed Consent: I have reviewed the patients History and Physical, chart, labs and discussed the procedure including the risks, benefits and alternatives for the proposed anesthesia with the patient or authorized representative who has indicated his/her understanding and acceptance.     Dental Advisory Given  Plan Discussed with: CRNA, Surgeon and Anesthesiologist  Anesthesia Plan Comments: ( )        Anesthesia Quick Evaluation

## 2018-07-20 NOTE — Transfer of Care (Signed)
  Last Vitals:  Vitals Value Taken Time  BP 137/82 07/20/2018 10:30 AM  Temp    Pulse 106 07/20/2018 10:32 AM  Resp 21 07/20/2018 10:32 AM  SpO2 96 % 07/20/2018 10:32 AM  Vitals shown include unvalidated device data.  Last Pain:  Vitals:   07/20/18 0826  TempSrc:   PainSc: 0-No pain      Patients Stated Pain Goal: 8 (07/20/18 3354) Immediate Anesthesia Transfer of Care Note  Patient: Deanna Walker  Procedure(s) Performed: Procedure(s) (LRB): DILATATION AND EVACUATION (N/A)  Patient Location: PACU  Anesthesia Type: General  Level of Consciousness: awake, alert  and oriented  Airway & Oxygen Therapy: Patient Spontanous Breathing and Patient connected to nasal cannula  oxygen  Post-op Assessment: Report given to PACU RN and Post -op Vital signs reviewed and stable  Post vital signs: Reviewed and stable  Complications: No apparent anesthesia complications

## 2018-07-20 NOTE — Discharge Instructions (Addendum)
We will discuss your surgery once again in detail at your post-op visit in two to four weeks. If you havent already done so, please call to make your appointment as soon as possible.  Dilation and Curettage or Vacuum Curettage, Care After These instructions give you information on caring for yourself after your procedure. Your doctor may also give you more specific instructions. Call your doctor if you have any problems or questions after your procedure. HOME CARE  Do not drive for 24 hours.  Wait 1 week before doing any activities that wear you out.  Do not stand for a long time.  Limit stair climbing to once or twice a day.  Rest often.  Continue with your usual diet.  Drink enough fluids to keep your pee (urine) clear or pale yellow.  If you have a hard time pooping (constipation), you may:  Take a medicine to help you go poop (laxative) as told by your doctor.  Eat more fruit and bran.  Drink more fluids.  Take showers, not baths, for as long as told by your doctor.  Do not swim or use a hot tub until your doctor says it is okay.  Have someone with you for 1day after the procedure.  Do not douche, use tampons, or have sex (intercourse) until seen by your doctor  Only take medicines as told by your doctor. Do not take aspirin. It can cause bleeding.  Keep all doctor visits. GET HELP IF:  You have cramps or pain not helped by medicine.  You have new pain in the belly (abdomen).  You have a bad smelling fluid coming from your vagina.  You have a rash.  You have problems with any medicine. GET HELP RIGHT AWAY IF:   You start to bleed more than a regular period.  You have a fever.  You have chest pain.  You have trouble breathing.  You feel dizzy or feel like passing out (fainting).  You pass out.  You have pain in the tops of your shoulders.  You have vaginal bleeding with or without clumps of blood (blood clots). MAKE SURE YOU:  Understand  these instructions.  Will watch your condition.  Will get help right away if you are not doing well or get worse. Document Released: 03/31/2008 Document Revised: 06/27/2013 Document Reviewed: 01/19/2013 [facility] Patient Information 2015 [place], [place]. This information is not intended to replace advice given to you by your health care provider. Make sure you discuss any questions you have with your health care provider.   Post Anesthesia Home Care Instructions  Activity: Get plenty of rest for the remainder of the day. A responsible adult should stay with you for 24 hours following the procedure.  For the next 24 hours, DO NOT: -Drive a car -Advertising copywriter -Drink alcoholic beverages -Take any medication unless instructed by your physician -Make any legal decisions or sign important papers.  Meals: Start with liquid foods such as gelatin or soup. Progress to regular foods as tolerated. Avoid greasy, spicy, heavy foods. If nausea and/or vomiting occur, drink only clear liquids until the nausea and/or vomiting subsides. Call your physician if vomiting continues.  Special Instructions/Symptoms: Your throat may feel dry or sore from the anesthesia or the breathing tube placed in your throat during surgery. If this causes discomfort, gargle with warm salt water. The discomfort should disappear within 24 hours.  If you had a scopolamine patch placed behind your ear for the management of post- operative nausea  of post- operative nausea and/or vomiting: ° °1. The medication in the patch is effective for 72 hours, after which it should be removed.  Wrap patch in a tissue and discard in the trash. Wash hands thoroughly with soap and water. °2. You may remove the patch earlier than 72 hours if you experience unpleasant side effects which may include dry mouth, dizziness or visual disturbances. °3. Avoid touching the patch. Wash your hands with soap and water after contact with the patch. °  ° ° °

## 2018-07-20 NOTE — Anesthesia Procedure Notes (Signed)
Procedure Name: LMA Insertion Date/Time: 07/20/2018 9:56 AM Performed by: Bethena Midget, MD Pre-anesthesia Checklist: Patient identified, Emergency Drugs available, Suction available and Patient being monitored Patient Re-evaluated:Patient Re-evaluated prior to induction Oxygen Delivery Method: Circle system utilized Preoxygenation: Pre-oxygenation with 100% oxygen Induction Type: IV induction Ventilation: Mask ventilation without difficulty LMA: LMA inserted LMA Size: 4.0 Number of attempts: 1 Airway Equipment and Method: Bite block Placement Confirmation: positive ETCO2 Tube secured with: Tape Dental Injury: Teeth and Oropharynx as per pre-operative assessment

## 2018-07-20 NOTE — Anesthesia Postprocedure Evaluation (Signed)
Anesthesia Post Note  Patient: Deanna Walker  Procedure(s) Performed: DILATATION AND EVACUATION (N/A Vagina )     Patient location during evaluation: PACU Anesthesia Type: General Level of consciousness: awake and alert Pain management: pain level controlled Vital Signs Assessment: post-procedure vital signs reviewed and stable Respiratory status: spontaneous breathing, nonlabored ventilation, respiratory function stable and patient connected to nasal cannula oxygen Cardiovascular status: blood pressure returned to baseline and stable Postop Assessment: no apparent nausea or vomiting Anesthetic complications: no    Last Vitals:  Vitals:   07/20/18 1045 07/20/18 1100  BP: 130/74 137/82  Pulse: 88 88  Resp: 20 (!) 23  Temp:    SpO2: 94% 94%    Last Pain:  Vitals:   07/20/18 1100  TempSrc:   PainSc: 0-No pain                 Ryelan Kazee

## 2018-07-21 ENCOUNTER — Encounter (HOSPITAL_BASED_OUTPATIENT_CLINIC_OR_DEPARTMENT_OTHER): Payer: Self-pay | Admitting: Obstetrics and Gynecology

## 2018-08-11 ENCOUNTER — Encounter: Payer: Medicaid Other | Admitting: Obstetrics and Gynecology

## 2018-08-24 ENCOUNTER — Encounter: Payer: Medicaid Other | Admitting: Obstetrics & Gynecology

## 2018-08-25 ENCOUNTER — Telehealth: Payer: Self-pay | Admitting: Obstetrics & Gynecology

## 2018-08-25 ENCOUNTER — Encounter: Payer: Self-pay | Admitting: Obstetrics & Gynecology

## 2018-08-25 NOTE — Telephone Encounter (Signed)
Mailed letter to call office to reschedule appointments

## 2018-08-26 ENCOUNTER — Other Ambulatory Visit: Payer: Self-pay

## 2018-08-26 ENCOUNTER — Encounter (HOSPITAL_COMMUNITY): Payer: Self-pay | Admitting: Emergency Medicine

## 2018-08-26 ENCOUNTER — Emergency Department (HOSPITAL_COMMUNITY)
Admission: EM | Admit: 2018-08-26 | Discharge: 2018-08-26 | Disposition: A | Discharge status: Home / Self Care | Payer: Medicaid Other | Attending: Emergency Medicine | Admitting: Emergency Medicine

## 2018-08-26 DIAGNOSIS — Z79899 Other long term (current) drug therapy: Secondary | ICD-10-CM | POA: Diagnosis not present

## 2018-08-26 DIAGNOSIS — Z7984 Long term (current) use of oral hypoglycemic drugs: Secondary | ICD-10-CM | POA: Insufficient documentation

## 2018-08-26 DIAGNOSIS — R7303 Prediabetes: Secondary | ICD-10-CM | POA: Insufficient documentation

## 2018-08-26 DIAGNOSIS — F17200 Nicotine dependence, unspecified, uncomplicated: Secondary | ICD-10-CM | POA: Insufficient documentation

## 2018-08-26 DIAGNOSIS — R1110 Vomiting, unspecified: Secondary | ICD-10-CM | POA: Diagnosis present

## 2018-08-26 DIAGNOSIS — R197 Diarrhea, unspecified: Secondary | ICD-10-CM | POA: Insufficient documentation

## 2018-08-26 DIAGNOSIS — R1084 Generalized abdominal pain: Secondary | ICD-10-CM | POA: Insufficient documentation

## 2018-08-26 DIAGNOSIS — R109 Unspecified abdominal pain: Secondary | ICD-10-CM

## 2018-08-26 DIAGNOSIS — R112 Nausea with vomiting, unspecified: Secondary | ICD-10-CM

## 2018-08-26 LAB — LIPASE, BLOOD: Lipase: 31 U/L (ref 11–51)

## 2018-08-26 LAB — COMPREHENSIVE METABOLIC PANEL
ALT: 24 U/L (ref 0–44)
AST: 28 U/L (ref 15–41)
Albumin: 4.5 g/dL (ref 3.5–5.0)
Alkaline Phosphatase: 58 U/L (ref 38–126)
Anion gap: 9 (ref 5–15)
BUN: 12 mg/dL (ref 6–20)
CHLORIDE: 102 mmol/L (ref 98–111)
CO2: 25 mmol/L (ref 22–32)
CREATININE: 0.74 mg/dL (ref 0.44–1.00)
Calcium: 9 mg/dL (ref 8.9–10.3)
GFR calc non Af Amer: >60 mL/min (ref >60)
Glucose, Bld: 123 mg/dL — ABNORMAL HIGH (ref 70–99)
Potassium: 3.6 mmol/L (ref 3.5–5.1)
Sodium: 136 mmol/L (ref 135–145)
Total Bilirubin: 0.6 mg/dL (ref 0.3–1.2)
Total Protein: 8.4 g/dL — ABNORMAL HIGH (ref 6.5–8.1)

## 2018-08-26 LAB — CBC WITH DIFFERENTIAL/PLATELET
Abs Immature Granulocytes: 0.03 10*3/uL (ref 0.00–0.07)
Basophils Absolute: 0 10*3/uL (ref 0.0–0.1)
Basophils Relative: 0 %
Eosinophils Absolute: 0.1 10*3/uL (ref 0.0–0.5)
Eosinophils Relative: 1 %
HEMATOCRIT: 43 % (ref 36.0–46.0)
Hemoglobin: 13.6 g/dL (ref 12.0–15.0)
Immature Granulocytes: 0 %
Lymphocytes Relative: 10 %
Lymphs Abs: 0.8 10*3/uL (ref 0.7–4.0)
MCH: 26 pg (ref 26.0–34.0)
MCHC: 31.6 g/dL (ref 30.0–36.0)
MCV: 82.2 fL (ref 80.0–100.0)
Monocytes Absolute: 0.5 10*3/uL (ref 0.1–1.0)
Monocytes Relative: 6 %
Neutro Abs: 7.3 10*3/uL (ref 1.7–7.7)
Neutrophils Relative %: 83 %
Platelets: 301 10*3/uL (ref 150–400)
RBC: 5.23 MIL/uL — ABNORMAL HIGH (ref 3.87–5.11)
RDW: 14.5 % (ref 11.5–15.5)
WBC: 8.7 10*3/uL (ref 4.0–10.5)
nRBC: 0 % (ref 0.0–0.2)

## 2018-08-26 LAB — I-STAT BETA HCG BLOOD, ED (MC, WL, AP ONLY): I-stat hCG, quantitative: <5 m[IU]/mL (ref <5)

## 2018-08-26 MED ORDER — SODIUM CHLORIDE 0.9 % IV BOLUS
1000.0000 mL | Freq: Once | INTRAVENOUS | Status: AC
  Administered 2018-08-26: 1000 mL via INTRAVENOUS

## 2018-08-26 MED ORDER — ONDANSETRON HCL 4 MG/2ML IJ SOLN
4.0000 mg | Freq: Once | INTRAMUSCULAR | Status: AC
  Administered 2018-08-26: 4 mg via INTRAVENOUS
  Filled 2018-08-26: qty 2

## 2018-08-26 MED ORDER — DICYCLOMINE HCL 10 MG/ML IM SOLN
20.0000 mg | Freq: Once | INTRAMUSCULAR | Status: AC
  Administered 2018-08-26: 20 mg via INTRAMUSCULAR
  Filled 2018-08-26: qty 2

## 2018-08-26 MED ORDER — ONDANSETRON 4 MG PO TBDP: ORAL_TABLET | ORAL | 0 refills | Status: DC

## 2018-08-26 MED ORDER — FAMOTIDINE 20 MG PO TABS: 20.0000 mg | ORAL_TABLET | Freq: Two times a day (BID) | ORAL | 0 refills | Status: AC

## 2018-08-26 MED ORDER — DICYCLOMINE HCL 20 MG PO TABS: 20.0000 mg | ORAL_TABLET | Freq: Two times a day (BID) | ORAL | 0 refills | Status: AC

## 2018-08-26 MED ORDER — FAMOTIDINE IN NACL 20-0.9 MG/50ML-% IV SOLN
20.0000 mg | Freq: Once | INTRAVENOUS | Status: AC
  Administered 2018-08-26: 20 mg via INTRAVENOUS
  Filled 2018-08-26: qty 50

## 2018-08-26 NOTE — ED Triage Notes (Signed)
Patient c/o N/V/D since last night. Also c/o intermittent abdominal cramping.

## 2018-08-26 NOTE — ED Provider Notes (Signed)
[facility] DEPT Provider Note   CSN: [id number] Arrival date & time: 08/26/18  1123    History   Chief Complaint Chief Complaint  Patient presents with  . Emesis  . Diarrhea    HPI Deanna Walker is a 38 y.o. female.     Deanna Walker is a 38 y.o. female with a history of PCOS, prediabetes and depression, who presents to the emergency department for evaluation of nausea, vomiting and diarrhea with some associated abdominal cramping.  She reports symptoms began suddenly last night and she was up throughout the night having episodes of emesis and loose stools.  She denies any melena or hematochezia.  Has not had any episodes of hematemesis.  She reports associated with that she has had intermittent generalized abdominal cramping, she reports this seems to be especially present right before she has to vomit or have a bowel movement.  Abdominal pain was worse when she tried to lay on her back but better when she laid on her stomach, she was unable to get much rest last night due to her symptoms.  She denies any fevers or chills.  Had some upper respiratory symptoms last weekend but those seem to be improving.  Denies dysuria or urinary frequency.  No vaginal bleeding or discharge.  Had a recent D&C procedure with OB/GYN without complications.     Past Medical History:  Diagnosis Date  . Abnormal Pap smear 2011   colpo and biopsy done, normal paps since  . Chronic urticaria   . Depression   . History of cardiac murmur as a child    infant  . History of cervical incompetence   . History of gestational diabetes   . Infertility associated with anovulation 01/24/2013  . PCOS (polycystic ovarian syndrome)   . Perennial and seasonal allergic rhinoconjunctivitis 09/25/2015  . Pre-diabetes   . Pregnancy induced hypertension    07-19-2018 per pt watching diet ,  no meds    Patient Active Problem List   Diagnosis Date Noted  . Missed abortion  07/19/2018  . Gestational diabetes mellitus (GDM) affecting pregnancy, antepartum 06/30/2018  . History of gestational diabetes in prior pregnancy, currently pregnant 06/20/2018  . Supervision of high risk pregnancy, antepartum 06/20/2018  . History of incompetent cervix, currently pregnant 06/20/2018  . AMA (advanced maternal age) primigravida 35+ 06/20/2018  . Chronic hypertension 09/17/2017  . Chronic urticaria 09/25/2015  . Prediabetes 11/12/2013  . PCOS (polycystic ovarian syndrome) 02/11/2011    Past Surgical History:  Procedure Laterality Date  . COLPOSCOPY    . DILATION AND EVACUATION N/A 07/20/2018   Procedure: DILATATION AND EVACUATION;  Surgeon: Aletha Halim, MD;  Location: [facility];  Service: Gynecology;  Laterality: N/A;  . WISDOM TOOTH EXTRACTION  2008  approx.     OB History    Gravida  3   Para  1   Term      Preterm  1   AB  1   Living  0     SAB      TAB      Ectopic  1   Multiple      Live Births  1            Home Medications    Prior to Admission medications   Medication Sig Start Date End Date Taking? Authorizing Provider  ACCU-CHEK FASTCLIX LANCETS MISC 1 Device by Percutaneous route 4 (four) times daily. 06/30/18  Yes Constant, Vickii Chafe, MD  Blood Glucose Monitoring Suppl (ACCU-CHEK GUIDE ME) w/Device KIT 1 kit by Does not apply route 4 (four) times daily. 06/30/18  Yes Constant, [name], MD  cetirizine (ZYRTEC) 10 MG tablet Take 10 mg by mouth daily as needed for allergies.  08/30/16  Yes [provider]  DM-Phenylephrine-Acetaminophen (ALKA-SELTZER PLS SINUS & COUGH) 10-5-325 MG CAPS Take 1 capsule by mouth daily as needed (for cold).   Yes [provider]  glucose blood test strip Use as instructed 06/30/18  Yes Constant, [name], MD  metFORMIN (GLUCOPHAGE) 500 MG tablet Take 1 tablet (500 mg total) by mouth 2 (two) times daily with a meal. 10/08/16  Yes Denney, Rachelle A, CNM    Phenyleph-Doxylamine-DM-APAP (NYQUIL SEVERE COLD/FLU) 5-6.25-10-325 MG/15ML LIQD Take 15 mLs by mouth every 6 (six) hours as needed (for cold).   Yes [provider]  prenatal vitamin w/FE, FA (PRENATAL 1 + 1) 27-1 MG TABS tablet Take 1 tablet by mouth daily.    Yes [provider]  vitamin E 100 UNIT capsule Take 100 Units by mouth every Saturday.    Yes [provider]  acetaminophen (TYLENOL) 500 MG tablet Take 1 tablet (500 mg total) by mouth every 6 (six) hours as needed. Patient not taking: Reported on 08/26/2018 07/20/18   Aletha Halim, MD  ibuprofen (ADVIL,MOTRIN) 600 MG tablet Take 1 tablet (600 mg total) by mouth every 6 (six) hours as needed. Patient not taking: Reported on 08/26/2018 07/20/18   Aletha Halim, MD    Family History Family History  Problem Relation Age of Onset  . Diabetes Maternal Aunt   . Hypertension Maternal Aunt   . Seizures Mother   . Allergic rhinitis Neg Hx   . Angioedema Neg Hx   . Asthma Neg Hx   . Atopy Neg Hx   . Eczema Neg Hx   . Immunodeficiency Neg Hx   . Urticaria Neg Hx   . Cystic fibrosis Neg Hx   . Lupus Neg Hx   . Emphysema Neg Hx   . Migraines Neg Hx     Social History Social History   Tobacco Use  . Smoking status: Current Some Day Smoker    Years: 2.00  . Smokeless tobacco: Never Used  . Tobacco comment: 07-19-2017 prior to current pregnancy ,  occasional cigeratte  Substance Use Topics  . Alcohol use: Not Currently    Alcohol/week: 0.0 standard drinks    Comment: occasionally   . Drug use: Not Currently     Allergies   Patient has no known allergies.   Review of Systems Review of Systems  Constitutional: Negative for chills and fever.  HENT: Negative.   Eyes: Negative for visual disturbance.  Respiratory: Negative for cough and shortness of breath.   Cardiovascular: Negative for chest pain.  Gastrointestinal: Positive for abdominal pain, diarrhea, nausea and vomiting. Negative for  blood in stool and constipation.  Genitourinary: Negative for dysuria, frequency, vaginal bleeding and vaginal discharge.  Musculoskeletal: Negative for arthralgias and myalgias.  Skin: Negative for color change and rash.  Neurological: Negative for dizziness, syncope and light-headedness.     Physical Exam Updated Vital Signs BP (!) 121/108 (BP Location: Left Arm)   Pulse (!) 108   Temp 98.8 F (37.1 C) (Oral)   Resp 16   Ht _0  (1.651 m)   Wt 86.2 kg   LMP 04/22/2018   SpO2 100%   BMI 31.62 kg/m   Physical Exam Vitals signs and nursing note reviewed.  Constitutional:      General: She is not in acute distress.    Appearance: Normal appearance. She is well-developed. She is obese. She is not ill-appearing or diaphoretic.  HENT:     Head: Normocephalic and atraumatic.     Mouth/Throat:     Mouth: Mucous membranes are moist.     Pharynx: Oropharynx is clear.  Eyes:     General:        Right eye: No discharge.        Left eye: No discharge.  Neck:     Musculoskeletal: Neck supple.  Cardiovascular:     Rate and Rhythm: Normal rate and regular rhythm.     Pulses: Normal pulses.     Heart sounds: Normal heart sounds. No murmur. No friction rub. No gallop.   Pulmonary:     Effort: Pulmonary effort is normal. No respiratory distress.     Breath sounds: Normal breath sounds.     Comments: Respirations equal and unlabored, patient able to speak in full sentences, lungs clear to auscultation bilaterally Abdominal:     General: Abdomen is flat. Bowel sounds are normal. There is no distension.     Palpations: Abdomen is soft. There is no mass.     Tenderness: There is abdominal tenderness. There is no guarding.     Comments: Abdomen is soft and nondistended, bowel sounds present and normoactive throughout, there is some mild generalized tenderness throughout the abdomen with no focal area of pain, no rebound tenderness or guarding, no rigidity, no peritonitis, no CVA  tenderness bilaterally.  Musculoskeletal:        General: No deformity.  Skin:    General: Skin is warm and dry.  Neurological:     Mental Status: She is alert and oriented to person, place, and time.     Coordination: Coordination normal.  Psychiatric:        Mood and Affect: Mood normal.        Behavior: Behavior normal.      ED Treatments / Results  Labs (all labs ordered are listed, but only abnormal results are displayed) Labs Reviewed  COMPREHENSIVE METABOLIC PANEL - Abnormal; Notable for the following components:      Result Value   Glucose, Bld 123 (*)    Total Protein 8.4 (*)    All other components within normal limits  CBC WITH DIFFERENTIAL/PLATELET - Abnormal; Notable for the following components:   RBC 5.23 (*)    All other components within normal limits  LIPASE, BLOOD  URINALYSIS, ROUTINE W REFLEX MICROSCOPIC  I-STAT BETA HCG BLOOD, ED ([facility], [facility], [facility] ONLY)    EKG None  Radiology No results found.  Procedures Procedures (including critical care time)  Medications Ordered in ED Medications  sodium chloride 0.9 % bolus 1,000 mL (0 mLs Intravenous Stopped 08/26/18 1502)  ondansetron (ZOFRAN) injection 4 mg (4 mg Intravenous Given 08/26/18 1316)  famotidine (PEPCID) IVPB 20 mg premix (0 mg Intravenous Stopped 08/26/18 1406)  dicyclomine (BENTYL) injection 20 mg (20 mg Intramuscular Given 08/26/18 1317)     Initial Impression / Assessment and Plan / ED Course  I have reviewed the triage vital signs and the nursing notes.  Pertinent labs & imaging results that were available during my care of the patient were reviewed by me and considered in my medical decision making (see chart for details).  Patient presents for evaluation of nausea, vomiting and diarrhea with some intermittent abdominal cramping, symptoms started suddenly last night.  On arrival patient is well-appearing, initially mildly tachycardic but vitals have improved prior to treatment.  On exam  patient has mild generalized abdominal tenderness but no guarding, rebound or rigidity, no concern for acute surgical abdomen.  Given story I suspect symptoms may be related to viral gastroenteritis.  Do not think that abdominal imaging is indicated but will check basic abdominal labs and give IV fluids, Zofran, Pepcid and Bentyl for symptom management.  Negative pregnancy, no leukocytosis, normal hemoglobin, no acute electrolyte derangements requiring intervention, normal renal and liver function and normal lipase.  Patient urinated prior to providing sample and is unable to urinate again but she denies any urinary symptoms and given that she is feeling much better after symptomatic treatment feel it is reasonable to discharge home without urinalysis.  Will prescribe Zofran, Pepcid and Bentyl for continued outpatient management.  Discussed with patient advancing her diet slowly.  Return precautions discussed.  Patient expresses understanding and agreement with plan, discharged home in good condition.  Final Clinical Impressions(s) / ED Diagnoses   Final diagnoses:  Nausea vomiting and diarrhea  Abdominal cramping    ED Discharge Orders         Ordered    ondansetron (ZOFRAN ODT) 4 MG disintegrating tablet     08/26/18 1529    famotidine (PEPCID) 20 MG tablet  2 times daily     08/26/18 1529    dicyclomine (BENTYL) 20 MG tablet  2 times daily     08/26/18 1529           Jacqlyn Larsen, [place] 08/26/18 1647    Maudie Flakes, MD 08/27/18 0730

## 2018-08-26 NOTE — Discharge Instructions (Addendum)
Your work-up today is reassuring and I suspect your nausea, vomiting and diarrhea as well as your intermittent abdominal cramping is due to a viral illness, you can use Pepcid twice daily before breakfast and bedtime, Zofran as needed for nausea and Bentyl as needed for abdominal cramping.  Avoid NSAIDs like ibuprofen or Aleve and avoid alcohol as these can worsen your stomach irritation.  Advance your diet slowly from clear liquids to bland foods before returning to your regular diet.  Follow-up with your primary care doctor.  Return to the emergency department if you have worsening or localized abdominal pain, persistent vomiting, blood in your vomit or stools, high fevers or any other new or concerning symptoms.

## 2018-10-13 LAB — SMN1 COPY NUMBER ANALYSIS (SMA CARRIER SCREENING)

## 2018-10-13 LAB — SPECIMEN STATUS REPORT

## 2019-04-16 IMAGING — US US OB < 14 WEEKS - US OB TV
1 series · 15 of 28 positions shown · non-contrast
Comparison: None.

CLINICAL DATA: Vaginal spotting. Gestational age by last menstrual
period 7 weeks and 0 days.

EXAM:
OBSTETRIC <14 WK US AND TRANSVAGINAL OB US
TECHNIQUE: Both transabdominal and transvaginal ultrasound examinations were
performed for complete evaluation of the gestation as well as the
maternal uterus, adnexal regions, and pelvic cul-de-sac.
Transvaginal technique was performed to assess early pregnancy.

[Series 1: us ob < 14 weeks - us ob tv · 15 of 58 slices shown]
[im 1/58]
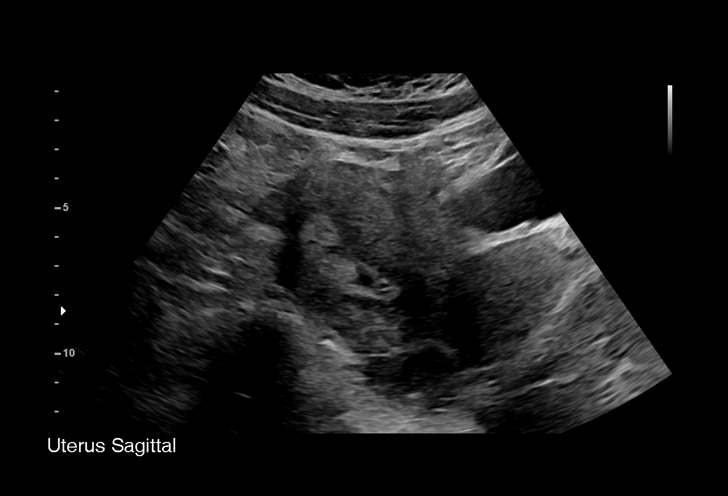
[im 5/58]
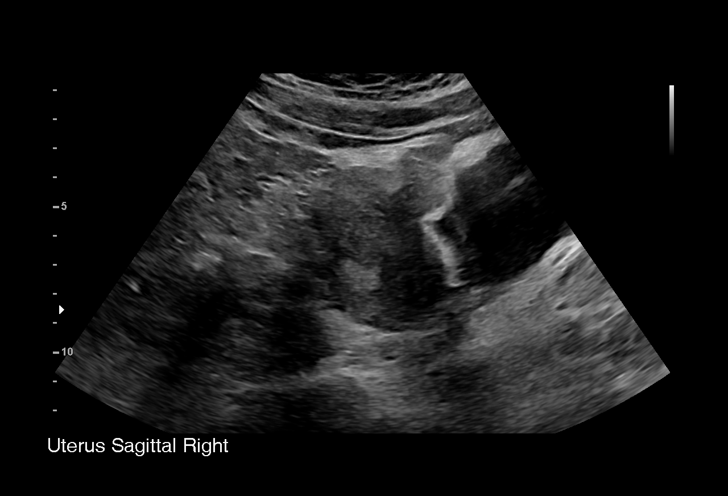
[im 9/58]
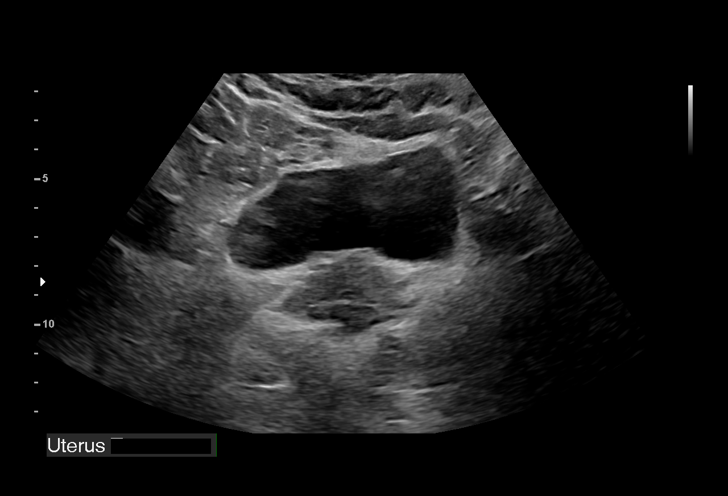
[im 13/58]
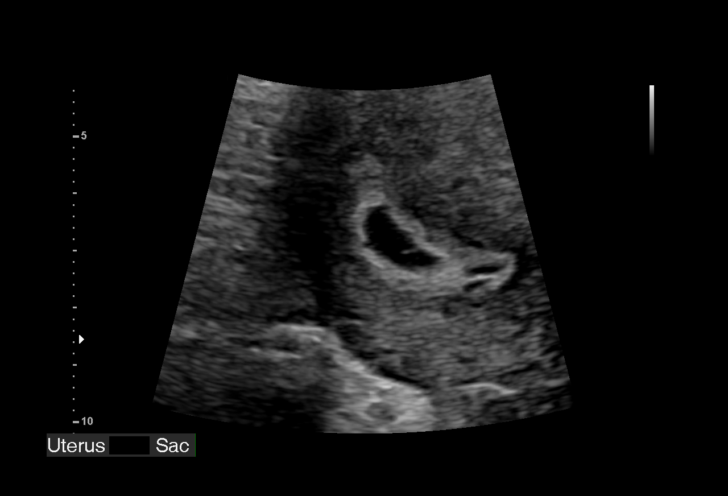
[im 17/58]
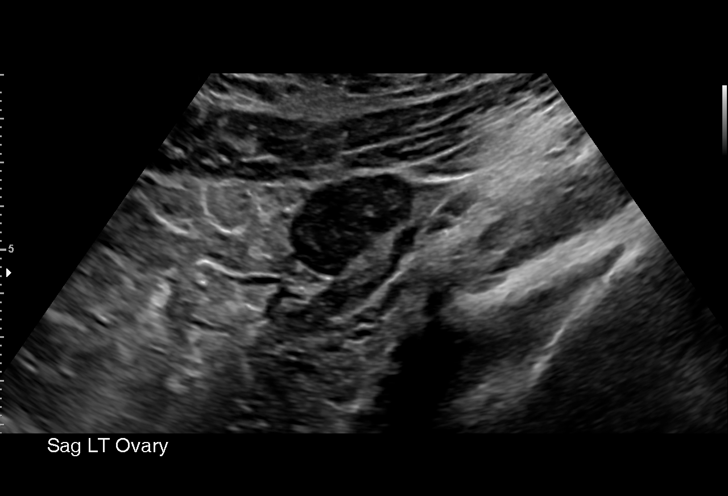
[im 22/58]
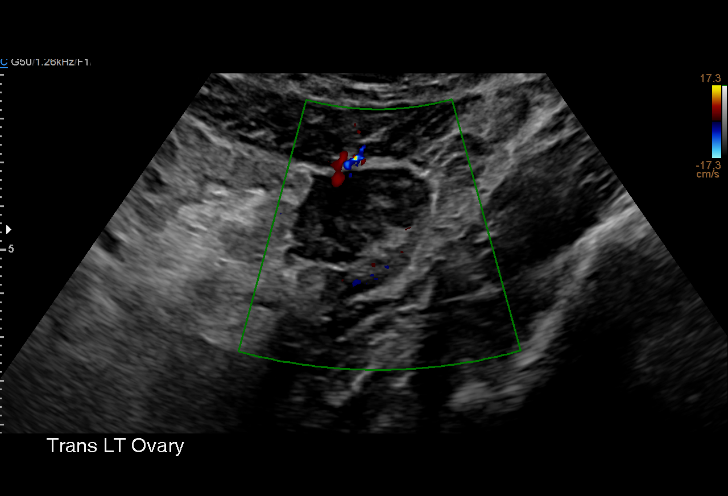
[im 26/58]
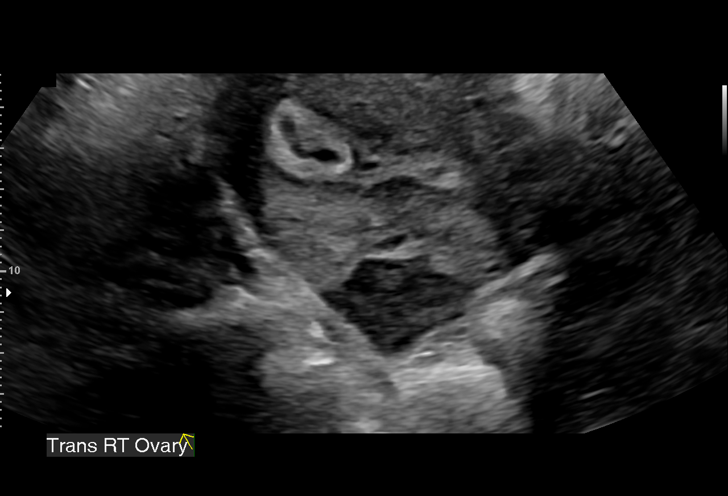
[im 30/58]
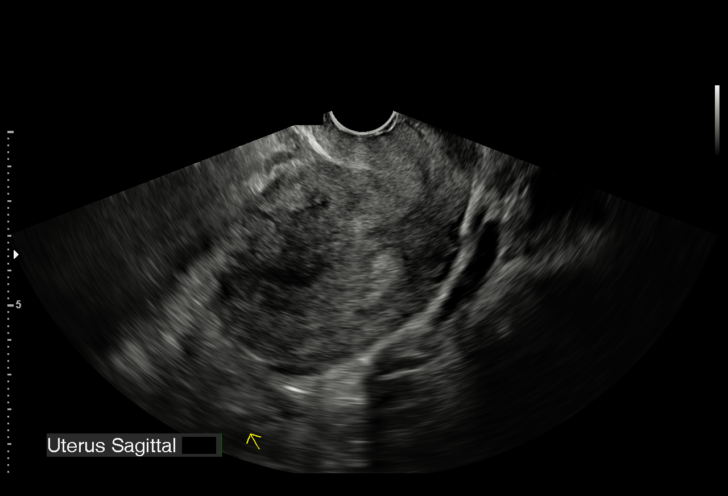
[im 32/58]
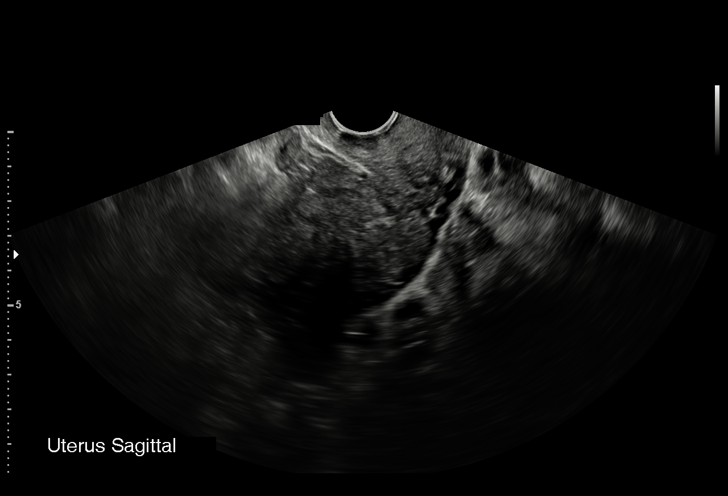
[im 36/58]
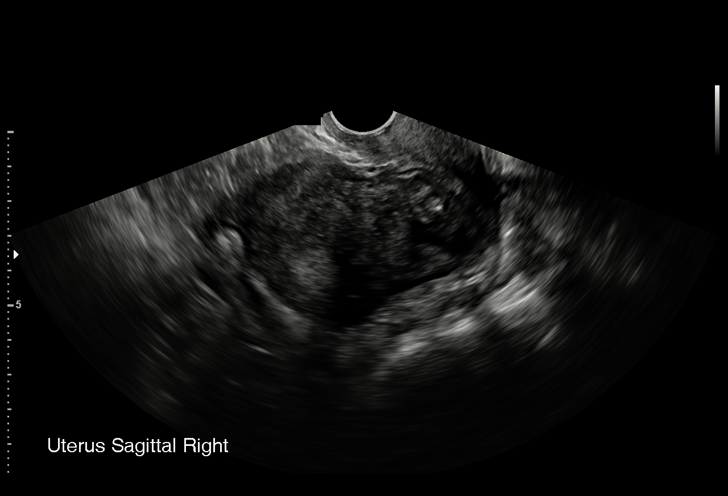
[im 41/58]
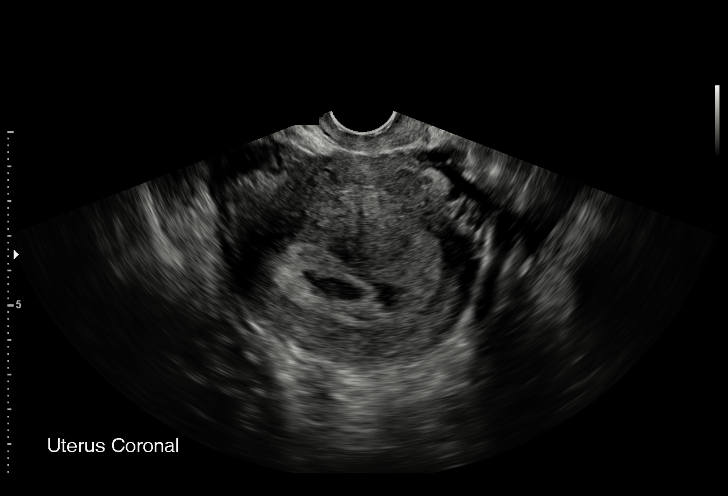
[im 45/58]
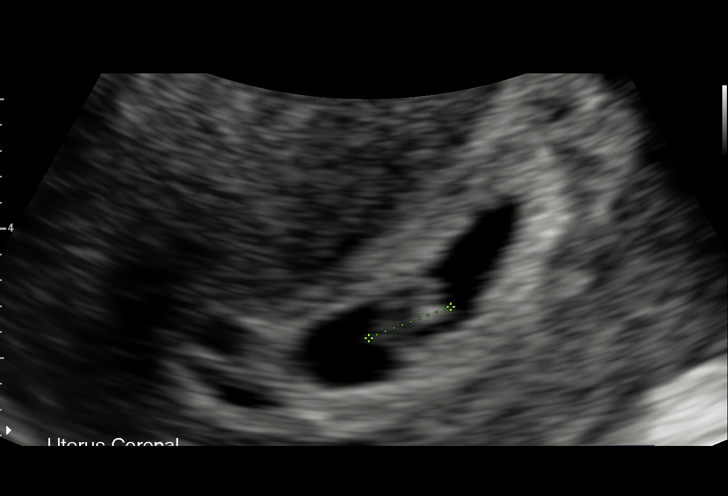
[im 49/58]
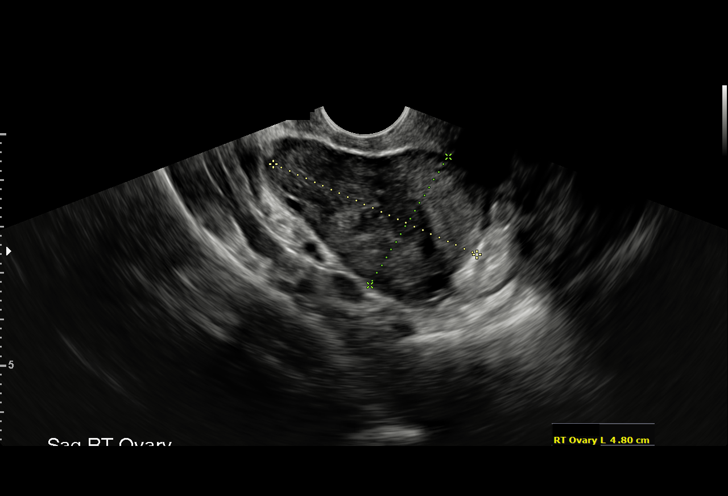
[im 53/58]
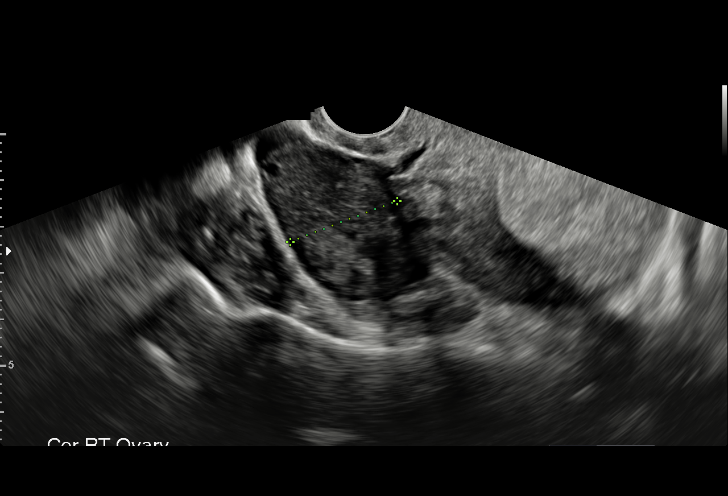
[im 58/58]
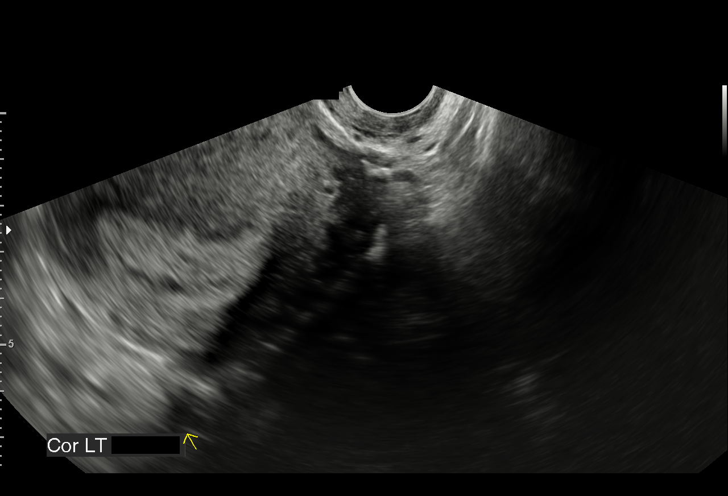

[15 of 28 positions shown; findings below may reference images not displayed]

FINDINGS: Intrauterine gestational sac: Present.

Yolk sac:  Present.

Embryo:  Present.

Cardiac Activity: Present.

Heart Rate: 129 bpm

CRL:  6.5 mm   6 w   3 d                  US EDC: January 31, 2019

Subchorionic hemorrhage:  Small

Maternal uterus/adnexae: Normal appearance of the adnexa. RIGHT
intra-ovarian corpus luteal cyst. No free fluid.
IMPRESSION: 1. Single live intrauterine pregnancy, gestational age by ultrasound
6 weeks and 3 days. Small subchorionic hemorrhage.

## 2019-04-21 ENCOUNTER — Encounter (HOSPITAL_COMMUNITY): Payer: Self-pay

## 2019-05-24 IMAGING — US US OB < 14 WEEKS - US OB TV
1 series · 15 of 28 positions shown · non-contrast
Comparison: 06/10/2018 obstetric scan.

CLINICAL DATA: 37-year-old pregnant female presents for evaluation
fetal dating and viability. Quantitative beta HCG on 06/10/2018 was
[DATE].

EDC by LMP: 01/27/2019, projecting to an expected gestational age of
12 weeks 3 days.
EXAM:
OBSTETRIC <14 WK US AND TRANSVAGINAL OB US
TECHNIQUE: Both transabdominal and transvaginal ultrasound examinations were
performed for complete evaluation of the gestation as well as the
maternal uterus, adnexal regions, and pelvic cul-de-sac.
Transvaginal technique was performed to assess early pregnancy.

[Series 1: us ob < 14 weeks - us ob tv · 42 acquisitions, 15 frames shown]
[im 1/42]
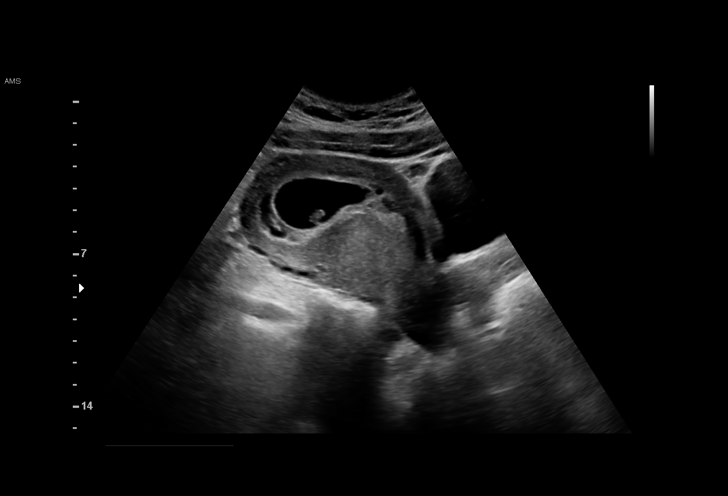
[im 4/42]
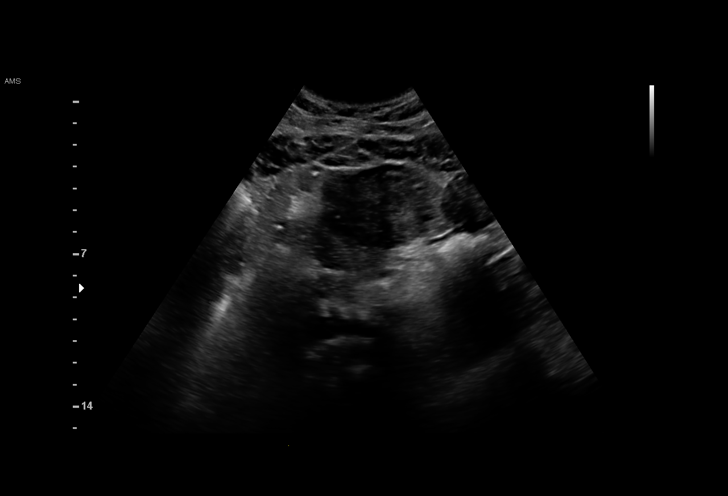
[im 7/42]
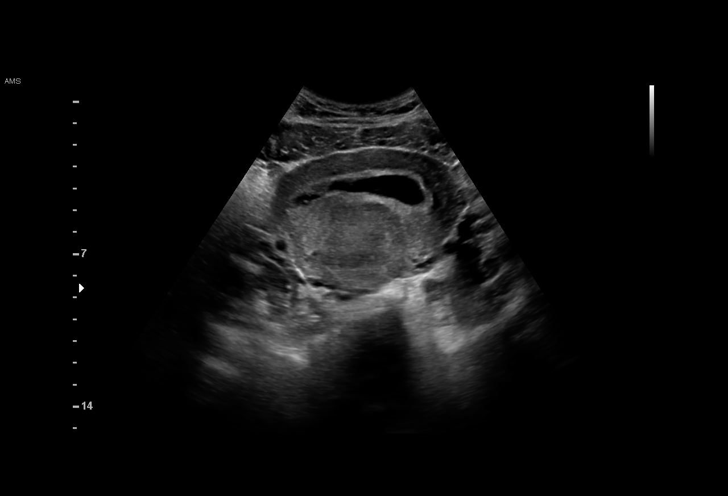
[im 10/42]
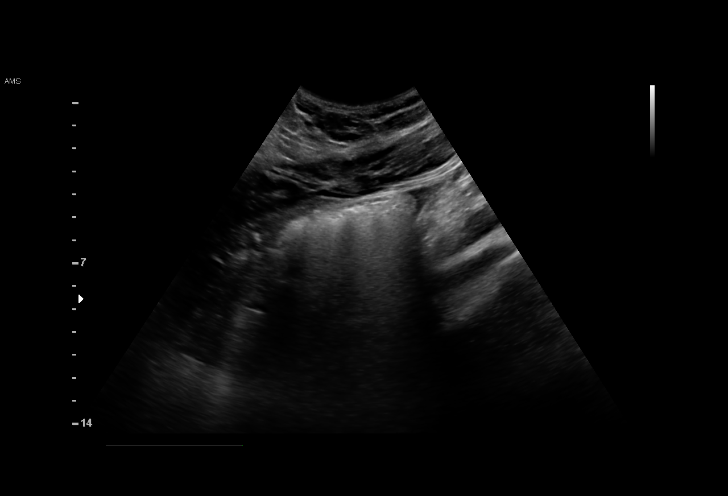
[im 13/42]
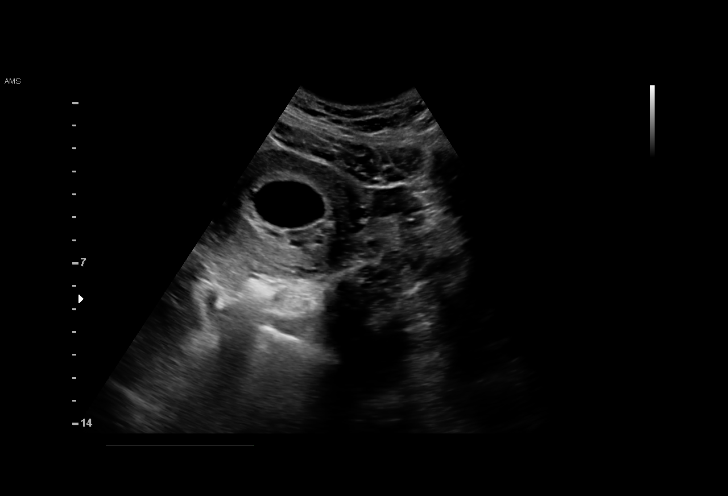
[im 16/42]
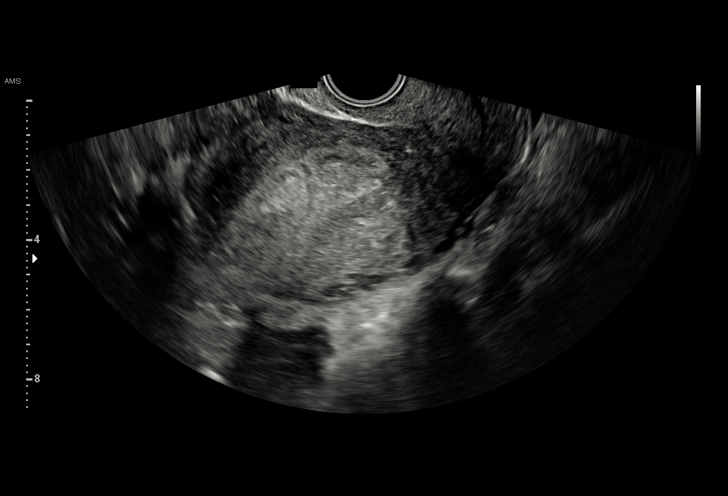
[im 19/42]
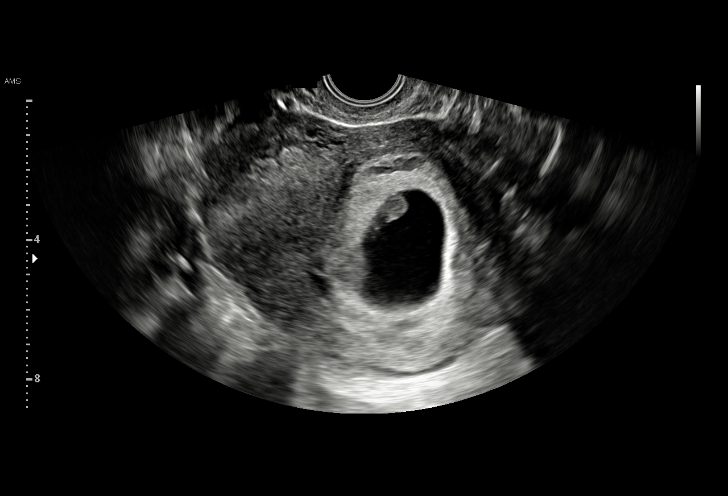
[im 22/42]
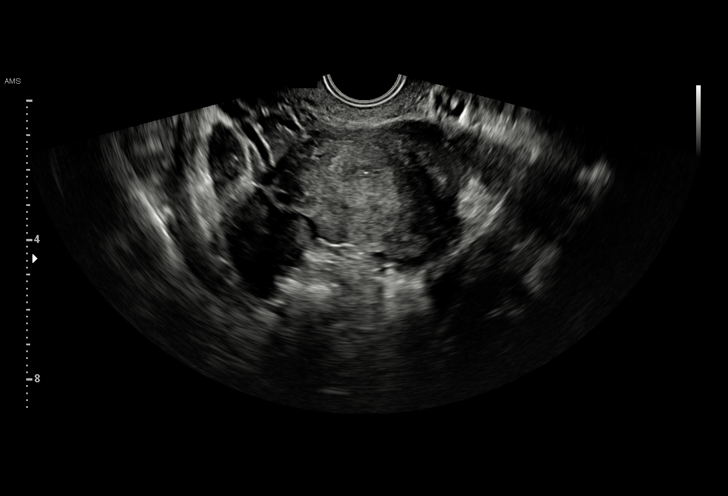
[im 23/42]
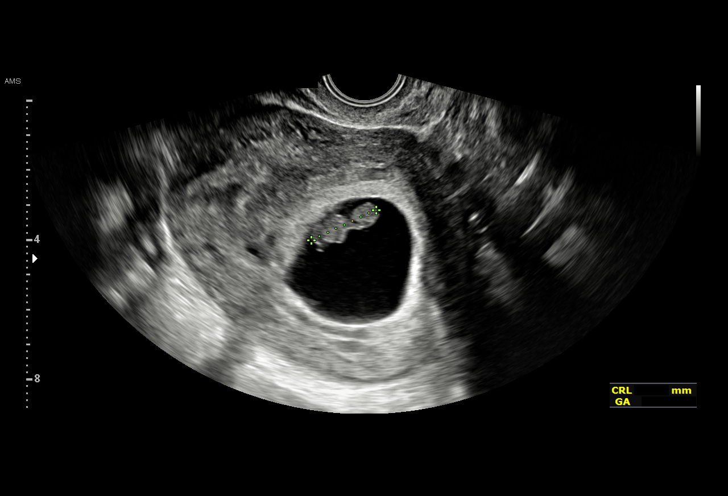
[im 26/42]
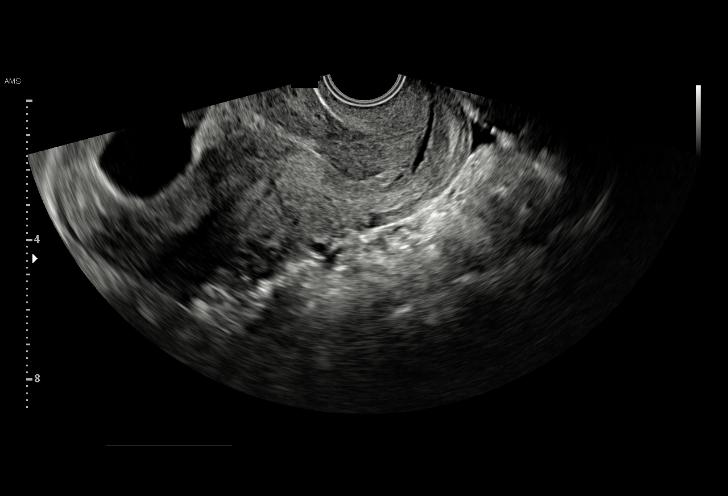
[im 29/42]
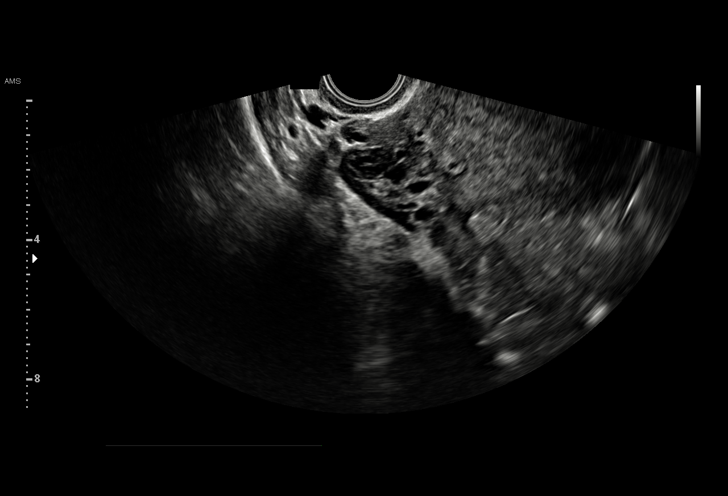
[im 32/42]
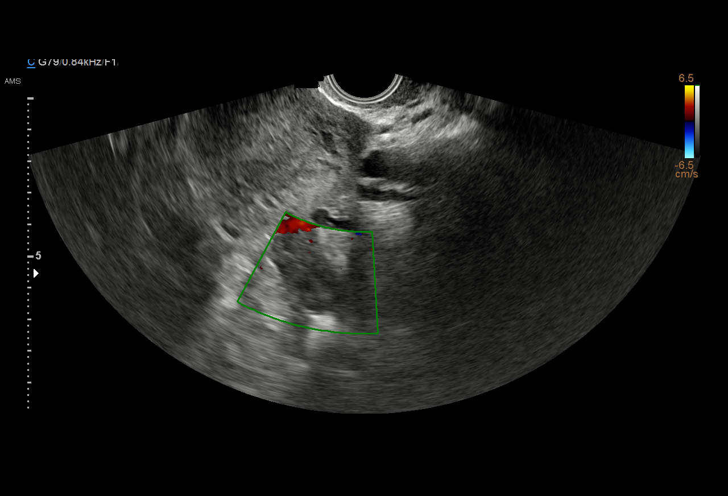
[im 35/42]
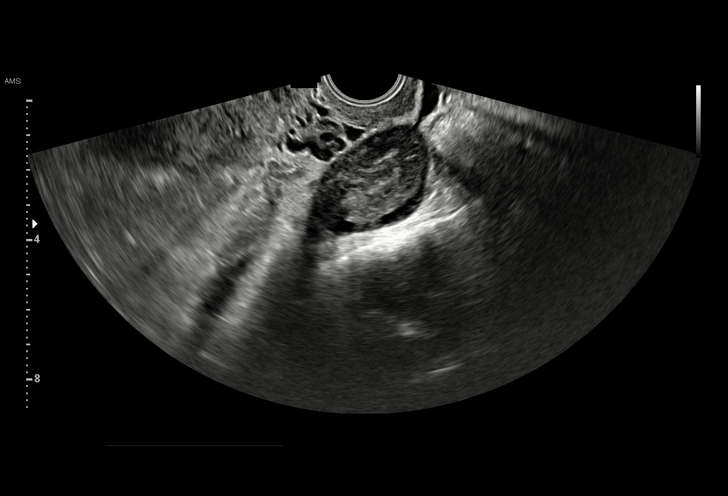
[im 38/42]
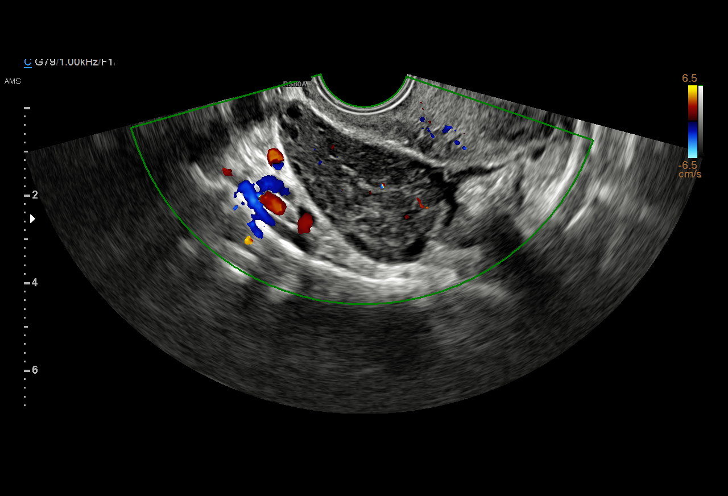
[im 42/42]
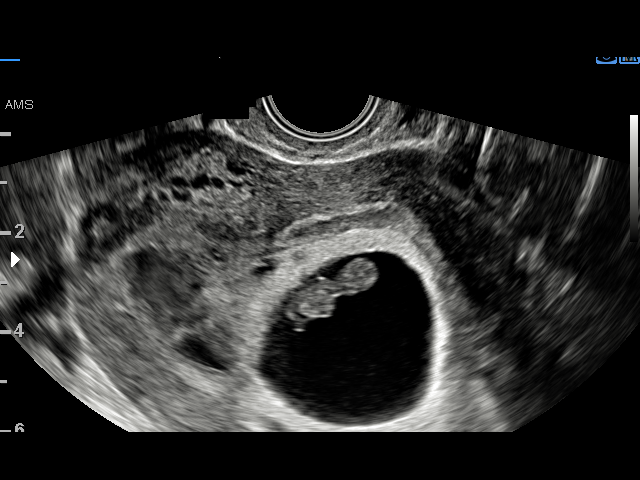

[15 of 28 positions shown; findings below may reference images not displayed]

FINDINGS: Intrauterine gestational sac: Single intrauterine gestational sac.

Yolk sac:  Not Visualized.

Fetus:  Visualized.

Fetal Cardiac Activity: Not Visualized on M-mode or grayscale cine.

CRL:  21.1 mm   8 w   5 d                  US EDC: 02/22/2019

Subchorionic hemorrhage:  None visualized.

Maternal uterus/adnexae: Anteverted uterus with no fibroids
demonstrated. Right ovary measures 4.1 x 2.6 x 3.3 cm. Left ovary
measures 3.3 x 1.9 x 2.7 cm. No abnormal ovarian or adnexal masses.
IMPRESSION: 1. Single intrauterine gestation at 8 weeks 5 days by crown-rump
length. No fetal cardiac activity. Findings are diagnostic of
intrauterine fetal demise.
2. No ovarian or adnexal abnormality.

These results will be called to the ordering clinician or
representative by the [HOSPITAL] at the imaging location.

## 2020-07-20 ENCOUNTER — Ambulatory Visit (HOSPITAL_COMMUNITY)
Admission: EM | Admit: 2020-07-20 | Discharge: 2020-07-20 | Disposition: A | Discharge status: Home / Self Care | Payer: Medicaid Other | Attending: Family Medicine | Admitting: Family Medicine

## 2020-07-20 ENCOUNTER — Other Ambulatory Visit: Payer: Self-pay

## 2020-07-20 ENCOUNTER — Encounter (HOSPITAL_COMMUNITY): Payer: Self-pay

## 2020-07-20 DIAGNOSIS — H0014 Chalazion left upper eyelid: Secondary | ICD-10-CM | POA: Diagnosis not present

## 2020-07-20 MED ORDER — ERYTHROMYCIN 5 MG/GM OP OINT: TOPICAL_OINTMENT | OPHTHALMIC | 0 refills | Status: AC

## 2020-07-20 NOTE — ED Triage Notes (Signed)
Patient states she has had eye swelling/spot x 2 weeks. Pt is unaware of any injury or getting anything in her eye. Pt states it has worsened over the last 2 days. Pt is aox4 and ambulatory.

## 2020-07-20 NOTE — ED Provider Notes (Signed)
[facility]    CSN: [id number] Arrival date & time: 07/20/20  1310      History   Chief Complaint Chief Complaint  Patient presents with  . Eye Problem    X 2 weeks    HPI Deanna Walker is a 40 y.o. female.   Here today with painless nodule to left upper eyelid x 2 weeks that irritates her eye with opening and closing and causes clear watering. Denies redness to the eye, visual changes, injury to eye, fever, chills, headache, N/V. Has been doing compresses to eye and just yesterday bought antihistamines drops without benefit.      Past Medical History:  Diagnosis Date  . Abnormal Pap smear 2011   colpo and biopsy done, normal paps since  . Chronic urticaria   . Depression   . History of cardiac murmur as a child    infant  . History of cervical incompetence   . History of gestational diabetes   . Infertility associated with anovulation 01/24/2013  . PCOS (polycystic ovarian syndrome)   . Perennial and seasonal allergic rhinoconjunctivitis 09/25/2015  . Pre-diabetes   . Pregnancy induced hypertension    07-19-2018 per pt watching diet ,  no meds    Patient Active Problem List   Diagnosis Date Noted  . Missed abortion 07/19/2018  . Gestational diabetes mellitus (GDM) affecting pregnancy, antepartum 06/30/2018  . History of gestational diabetes in prior pregnancy, currently pregnant 06/20/2018  . Supervision of high risk pregnancy, antepartum 06/20/2018  . History of incompetent cervix, currently pregnant 06/20/2018  . AMA (advanced maternal age) primigravida 35+ 06/20/2018  . Chronic hypertension 09/17/2017  . Chronic urticaria 09/25/2015  . Prediabetes 11/12/2013  . PCOS (polycystic ovarian syndrome) 02/11/2011    Past Surgical History:  Procedure Laterality Date  . COLPOSCOPY    . DILATION AND EVACUATION N/A 07/20/2018   Procedure: DILATATION AND EVACUATION;  Surgeon: Aletha Halim, MD;  Location: [facility];  Service:  Gynecology;  Laterality: N/A;  . WISDOM TOOTH EXTRACTION  2008  approx.    OB History    Gravida  3   Para  1   Term      Preterm  1   AB  1   Living  0     SAB      IAB      Ectopic  1   Multiple      Live Births  1            Home Medications    Prior to Admission medications   Medication Sig Start Date End Date Taking? Authorizing Provider  ACCU-CHEK FASTCLIX LANCETS MISC 1 Device by Percutaneous route 4 (four) times daily. 06/30/18  Yes Constant, [name], MD  Blood Glucose Monitoring Suppl (ACCU-CHEK GUIDE ME) w/Device KIT 1 kit by Does not apply route 4 (four) times daily. 06/30/18  Yes Constant, [name], MD  cetirizine (ZYRTEC) 10 MG tablet Take 10 mg by mouth daily as needed for allergies.  08/30/16  Yes [provider]  erythromycin ophthalmic ointment Place a 1/2 inch ribbon of ointment into the lower eyelid BID prn. 07/20/20  Yes Volney American, PA-C  glucose blood test strip Use as instructed 06/30/18  Yes Constant, [name], MD  Phenyleph-Doxylamine-DM-APAP 5-6.25-10-325 MG/15ML LIQD Take 15 mLs by mouth every 6 (six) hours as needed (for cold).   Yes [provider]  prenatal vitamin w/FE, FA (PRENATAL 1 + 1) 27-1 MG TABS tablet Take 1  tablet by mouth daily.    Yes [provider]  vitamin E 100 UNIT capsule Take 100 Units by mouth every Saturday.    Yes [provider]  acetaminophen (TYLENOL) 500 MG tablet Take 1 tablet (500 mg total) by mouth every 6 (six) hours as needed. Patient not taking: No sig reported 07/20/18   Aletha Halim, MD  dicyclomine (BENTYL) 20 MG tablet Take 1 tablet (20 mg total) by mouth 2 (two) times daily. 08/26/18   Jacqlyn Larsen, PA-C  DM-Phenylephrine-Acetaminophen (ALKA-SELTZER PLS SINUS & COUGH) 10-5-325 MG CAPS Take 1 capsule by mouth daily as needed (for cold).    [provider]  famotidine (PEPCID) 20 MG tablet Take 1 tablet (20 mg total) by mouth 2 (two) times daily. 08/26/18    Jacqlyn Larsen, PA-C  ibuprofen (ADVIL,MOTRIN) 600 MG tablet Take 1 tablet (600 mg total) by mouth every 6 (six) hours as needed. Patient not taking: Reported on 08/26/2018 07/20/18   Aletha Halim, MD  metFORMIN (GLUCOPHAGE) 500 MG tablet Take 1 tablet (500 mg total) by mouth 2 (two) times daily with a meal. 10/08/16   Margurite Auerbach, Rachelle A, CNM  ondansetron (ZOFRAN ODT) 4 MG disintegrating tablet 62m ODT q4 hours prn nausea/vomit 08/26/18   FJacqlyn Larsen PA-C    Family History Family History  Problem Relation Age of Onset  . Diabetes Maternal Aunt   . Hypertension Maternal Aunt   . Seizures Mother   . Allergic rhinitis Neg Hx   . Angioedema Neg Hx   . Asthma Neg Hx   . Atopy Neg Hx   . Eczema Neg Hx   . Immunodeficiency Neg Hx   . Urticaria Neg Hx   . Cystic fibrosis Neg Hx   . Lupus Neg Hx   . Emphysema Neg Hx   . Migraines Neg Hx     Social History Social History   Tobacco Use  . Smoking status: Current Some Day Smoker    Years: 2.00  . Smokeless tobacco: Never Used  . Tobacco comment: 07-19-2017 prior to current pregnancy ,  occasional cigeratte  Vaping Use  . Vaping Use: Never used  Substance Use Topics  . Alcohol use: Not Currently    Alcohol/week: 0.0 standard drinks    Comment: occasionally   . Drug use: Not Currently     Allergies   Patient has no known allergies.   Review of Systems Review of Systems PER HPI   Physical Exam Triage Vital Signs ED Triage Vitals  Enc Vitals Group     BP 07/20/20 1417 116/68     Pulse Rate 07/20/20 1417 79     Resp 07/20/20 1417 18     Temp 07/20/20 1417 98 F (36.7 C)     Temp Source 07/20/20 1417 Oral     SpO2 07/20/20 1417 100 %     Weight --      Height --      Head Circumference --      Peak Flow --      Pain Score 07/20/20 1421 0     Pain Loc --      Pain Edu? --      Excl. in [place] --    No data found.  Updated Vital Signs BP 116/68 (BP Location: Right Arm)   Pulse 79   Temp 98 F (36.7 C)  (Oral)   Resp 18   LMP 07/13/2020 (Exact Date)   SpO2 100%   Visual Acuity  Right Eye Distance:   Left Eye Distance:   Bilateral Distance:    Right Eye Near:   Left Eye Near:    Bilateral Near:     Physical Exam Vitals and nursing note reviewed.  Constitutional:      Appearance: Normal appearance. She is not ill-appearing.  HENT:     Head: Atraumatic.  Eyes:     General:        Left eye: Discharge (clear) present.    Extraocular Movements: Extraocular movements intact.     Conjunctiva/sclera: Conjunctivae normal.     Pupils: Pupils are equal, round, and reactive to light.     Comments: Firm nontender non erythematous nodule left upper eyelid   Cardiovascular:     Rate and Rhythm: Normal rate and regular rhythm.     Heart sounds: Normal heart sounds.  Pulmonary:     Effort: Pulmonary effort is normal.     Breath sounds: Normal breath sounds.  Musculoskeletal:        General: Normal range of motion.     Cervical back: Normal range of motion and neck supple.  Skin:    General: Skin is warm and dry.  Neurological:     Mental Status: She is alert and oriented to person, place, and time.  Psychiatric:        Mood and Affect: Mood normal.        Thought Content: Thought content normal.        Judgment: Judgment normal.      UC Treatments / Results  Labs (all labs ordered are listed, but only abnormal results are displayed) Labs Reviewed - No data to display  EKG   Radiology No results found.  Procedures Procedures (including critical care time)  Medications Ordered in UC Medications - No data to display  Initial Impression / Assessment and Plan / UC Course  I have reviewed the triage vital signs and the nursing notes.  Pertinent labs & imaging results that were available during my care of the patient were reviewed by me and considered in my medical decision making (see chart for details).     Consistent with chalazion, discussed warm compresses, Eye  Specialist f/u given persistence. WIll give erythromycin ointment additionally for comfort and to protect against infection given conjunctival irritation from friction opening and closing eyelid.  Return precautions reviewed.   Final Clinical Impressions(s) / UC Diagnoses   Final diagnoses:  Chalazion left upper eyelid   Discharge Instructions   None    ED Prescriptions    Medication Sig Dispense Auth. Provider   erythromycin ophthalmic ointment Place a 1/2 inch ribbon of ointment into the lower eyelid BID prn. 3.5 g Volney American, PA-C     PDMP not reviewed this encounter.   Volney American, [place] 07/20/20 1514

## 2020-09-23 ENCOUNTER — Ambulatory Visit: Payer: Medicaid Other | Admitting: Obstetrics

## 2020-10-10 ENCOUNTER — Ambulatory Visit: Payer: Medicaid Other | Admitting: Obstetrics and Gynecology

## 2020-10-31 ENCOUNTER — Ambulatory Visit: Payer: Medicaid Other | Admitting: Obstetrics and Gynecology

## 2020-11-20 ENCOUNTER — Ambulatory Visit: Payer: Medicaid Other | Admitting: Obstetrics and Gynecology

## 2021-01-15 ENCOUNTER — Ambulatory Visit (HOSPITAL_COMMUNITY)
Admission: EM | Admit: 2021-01-15 | Discharge: 2021-01-15 | Disposition: A | Discharge status: Home / Self Care | Payer: Medicaid Other | Attending: Medical Oncology | Admitting: Medical Oncology

## 2021-01-15 ENCOUNTER — Encounter (HOSPITAL_COMMUNITY): Payer: Self-pay

## 2021-01-15 ENCOUNTER — Other Ambulatory Visit: Payer: Self-pay

## 2021-01-15 DIAGNOSIS — M62830 Muscle spasm of back: Secondary | ICD-10-CM | POA: Diagnosis not present

## 2021-01-15 MED ORDER — NAPROXEN 500 MG PO TABS: 500.0000 mg | ORAL_TABLET | Freq: Two times a day (BID) | ORAL | 0 refills | Status: DC

## 2021-01-15 MED ORDER — METHOCARBAMOL 500 MG PO TABS: 500.0000 mg | ORAL_TABLET | Freq: Two times a day (BID) | ORAL | 0 refills | Status: DC

## 2021-01-15 NOTE — ED Triage Notes (Signed)
Pt reports stiffness and pain lower back x 3 -4 days. Denies dysuria.

## 2021-01-15 NOTE — ED Provider Notes (Addendum)
[place]    CSN: [id number] Arrival date & time: 01/15/21  1009      History   Chief Complaint Chief Complaint  Patient presents with   Back Pain    HPI Deanna Walker is a 40 y.o. female.   HPI  Back Pain: Patient reports that she has had back pain for the past 3 days.  She states that she works at an [employer] where she does some lifting and twisting.  She states that she was in bed after she awoke 3 days ago and turned over in bed.  She states that she immediately felt muscle spasms especially in her left lower back.  Muscle spasms are severe in nature when they occur.  They do not radiate.  She has tried Motrin, tylenol and heating pad which have helped but the pain comes back.  She denies any loss of sensation of groin, incontinence or neuro weakness. * not lactating   Past Medical History:  Diagnosis Date   Abnormal Pap smear 2011   colpo and biopsy done, normal paps since   Chronic urticaria    Depression    History of cardiac murmur as a child    infant   History of cervical incompetence    History of gestational diabetes    Infertility associated with anovulation 01/24/2013   PCOS (polycystic ovarian syndrome)    Perennial and seasonal allergic rhinoconjunctivitis 09/25/2015   Pre-diabetes    Pregnancy induced hypertension    07-19-2018 per pt watching diet ,  no meds    Patient Active Problem List   Diagnosis Date Noted   Missed abortion 07/19/2018   Gestational diabetes mellitus (GDM) affecting pregnancy, antepartum 06/30/2018   History of gestational diabetes in prior pregnancy, currently pregnant 06/20/2018   Supervision of high risk pregnancy, antepartum 06/20/2018   History of incompetent cervix, currently pregnant 06/20/2018   AMA (advanced maternal age) primigravida 35+ 06/20/2018   Chronic hypertension 09/17/2017   Chronic urticaria 09/25/2015   Prediabetes 11/12/2013   PCOS (polycystic ovarian syndrome) 02/11/2011     Past Surgical History:  Procedure Laterality Date   COLPOSCOPY     DILATION AND EVACUATION N/A 07/20/2018   Procedure: DILATATION AND EVACUATION;  Surgeon: Aletha Halim, MD;  Location: [facility];  Service: Gynecology;  Laterality: N/A;   WISDOM TOOTH EXTRACTION  2008  approx.    OB History     Gravida  3   Para  1   Term      Preterm  1   AB  1   Living  0      SAB      IAB      Ectopic  1   Multiple      Live Births  1            Home Medications    Prior to Admission medications   Medication Sig Start Date End Date Taking? Authorizing Provider  methocarbamol (ROBAXIN) 500 MG tablet Take 1 tablet (500 mg total) by mouth 2 (two) times daily. 01/15/21  Yes Darielle Hancher M, PA-C  naproxen (NAPROSYN) 500 MG tablet Take 1 tablet (500 mg total) by mouth 2 (two) times daily. 01/15/21  Yes Marieli Rudy M, PA-C  ACCU-CHEK FASTCLIX LANCETS MISC 1 Device by Percutaneous route 4 (four) times daily. 06/30/18   Constant, [name], MD  acetaminophen (TYLENOL) 500 MG tablet Take 1 tablet (500 mg total) by mouth every 6 (six) hours as needed.  Patient not taking: No sig reported 07/20/18   Aletha Halim, MD  Blood Glucose Monitoring Suppl (ACCU-CHEK GUIDE ME) w/Device KIT 1 kit by Does not apply route 4 (four) times daily. 06/30/18   Constant, [name], MD  cetirizine (ZYRTEC) 10 MG tablet Take 10 mg by mouth daily as needed for allergies.  08/30/16   [provider]  dicyclomine (BENTYL) 20 MG tablet Take 1 tablet (20 mg total) by mouth 2 (two) times daily. 08/26/18   Jacqlyn Larsen, PA-C  DM-Phenylephrine-Acetaminophen 10-5-325 MG CAPS Take 1 capsule by mouth daily as needed (for cold).    [provider]  erythromycin ophthalmic ointment Place a 1/2 inch ribbon of ointment into the lower eyelid BID prn. 07/20/20   Volney American, PA-C  famotidine (PEPCID) 20 MG tablet Take 1 tablet (20 mg total) by mouth 2 (two) times daily.  08/26/18   Jacqlyn Larsen, PA-C  glucose blood test strip Use as instructed 06/30/18   Constant, [name], MD  ibuprofen (ADVIL,MOTRIN) 600 MG tablet Take 1 tablet (600 mg total) by mouth every 6 (six) hours as needed. Patient not taking: No sig reported 07/20/18   Aletha Halim, MD  metFORMIN (GLUCOPHAGE) 500 MG tablet Take 1 tablet (500 mg total) by mouth 2 (two) times daily with a meal. 10/08/16   Margurite Auerbach, Rachelle A, CNM  ondansetron (ZOFRAN ODT) 4 MG disintegrating tablet 25m ODT q4 hours prn nausea/vomit 08/26/18   FJacqlyn Larsen PA-C  Phenyleph-Doxylamine-DM-APAP 5-6.25-10-325 MG/15ML LIQD Take 15 mLs by mouth every 6 (six) hours as needed (for cold).    [provider]  prenatal vitamin w/FE, FA (PRENATAL 1 + 1) 27-1 MG TABS tablet Take 1 tablet by mouth daily.     [provider]  vitamin E 100 UNIT capsule Take 100 Units by mouth every Saturday.     [provider]    Family History Family History  Problem Relation Age of Onset   Diabetes Maternal Aunt    Hypertension Maternal Aunt    Seizures Mother    Allergic rhinitis Neg Hx    Angioedema Neg Hx    Asthma Neg Hx    Atopy Neg Hx    Eczema Neg Hx    Immunodeficiency Neg Hx    Urticaria Neg Hx    Cystic fibrosis Neg Hx    Lupus Neg Hx    Emphysema Neg Hx    Migraines Neg Hx     Social History Social History   Tobacco Use   Smoking status: Some Days    Years: 2.00    Pack years: 0.00    Types: Cigarettes   Smokeless tobacco: Never   Tobacco comments:    07-19-2017 prior to current pregnancy ,  occasional cigeratte  Vaping Use   Vaping Use: Never used  Substance Use Topics   Alcohol use: Not Currently    Alcohol/week: 0.0 standard drinks    Comment: occasionally    Drug use: Not Currently     Allergies   Patient has no known allergies.   Review of Systems Review of Systems  As stated above in HPI Physical Exam Triage Vital Signs ED Triage Vitals  Enc Vitals Group     BP  01/15/21 1111 136/85     Pulse Rate 01/15/21 1111 80     Resp 01/15/21 1111 18     Temp 01/15/21 1111 98.4 F (36.9 C)     Temp Source 01/15/21 1111 Oral  SpO2 01/15/21 1111 100 %     Weight --      Height --      Head Circumference --      Peak Flow --      Pain Score 01/15/21 1108 8     Pain Loc --      Pain Edu? --      Excl. in [place]? --    No data found.  Updated Vital Signs BP 136/85 (BP Location: Right Arm)   Pulse 80   Temp 98.4 F (36.9 C) (Oral)   Resp 18   SpO2 100%   Physical Exam Vitals and nursing note reviewed.  Constitutional:      General: She is not in acute distress.    Appearance: Normal appearance. She is not ill-appearing, toxic-appearing or diaphoretic.  Musculoskeletal:        General: Tenderness (NO midline tenderness of spine. There is muscle tenderness with spsams of the left lower back muscle groups) present. No swelling or deformity. Normal range of motion.     Right lower leg: No edema.     Left lower leg: No edema.  Neurological:     General: No focal deficit present.     Mental Status: She is alert and oriented to person, place, and time.     Sensory: No sensory deficit.     Motor: No weakness.     Coordination: Coordination normal.     Gait: Gait normal.     Deep Tendon Reflexes: Reflexes normal.     UC Treatments / Results  Labs (all labs ordered are listed, but only abnormal results are displayed) Labs Reviewed - No data to display  EKG   Radiology No results found.  Procedures Procedures (including critical care time)  Medications Ordered in UC Medications - No data to display  Initial Impression / Assessment and Plan / UC Course  I have reviewed the triage vital signs and the nursing notes.  Pertinent labs & imaging results that were available during my care of the patient were reviewed by me and considered in my medical decision making (see chart for details).     New.  We have discussed methocarbamol along  with naproxen which she can use with Tylenol if needed.  Discussed how to use these medications along with common potential side effects and precautions.  We discussed avoiding bedrest as this tends to make symptoms worse.  Heating pad and back stretches are recommended.  Discussed red flag signs and symptoms.  Follow-up as needed. Final Clinical Impressions(s) / UC Diagnoses   Final diagnoses:  Muscle spasm of back   Discharge Instructions   None    ED Prescriptions     Medication Sig Dispense Auth. Provider   naproxen (NAPROSYN) 500 MG tablet Take 1 tablet (500 mg total) by mouth 2 (two) times daily. 30 tablet Lynisha Osuch M, PA-C   methocarbamol (ROBAXIN) 500 MG tablet Take 1 tablet (500 mg total) by mouth 2 (two) times daily. 20 tablet Hughie Closs, [place]      PDMP not reviewed this encounter.   Hughie Closs, PA-C 01/15/21 1138    Hughie Closs, PA-C 01/15/21 1141

## 2021-02-04 ENCOUNTER — Other Ambulatory Visit: Payer: Self-pay

## 2021-02-04 ENCOUNTER — Encounter (HOSPITAL_COMMUNITY): Payer: Self-pay

## 2021-02-04 ENCOUNTER — Ambulatory Visit (HOSPITAL_COMMUNITY)
Admission: EM | Admit: 2021-02-04 | Discharge: 2021-02-04 | Disposition: A | Discharge status: Home / Self Care | Payer: Medicaid Other | Attending: Emergency Medicine | Admitting: Emergency Medicine

## 2021-02-04 DIAGNOSIS — M5432 Sciatica, left side: Secondary | ICD-10-CM

## 2021-02-04 MED ORDER — TIZANIDINE HCL 4 MG PO TABS: 4.0000 mg | ORAL_TABLET | Freq: Three times a day (TID) | ORAL | 0 refills | Status: AC | PRN

## 2021-02-04 MED ORDER — METHYLPREDNISOLONE 4 MG PO TBPK: ORAL_TABLET | Freq: Every day | ORAL | 0 refills | Status: AC

## 2021-02-04 MED ORDER — NAPROXEN 500 MG PO TABS: 500.0000 mg | ORAL_TABLET | Freq: Two times a day (BID) | ORAL | 0 refills | Status: AC

## 2021-02-04 NOTE — ED Triage Notes (Signed)
Pt reports left sided lower back pain, ;eft buttock pain and left leg pain x 2 weeks. States pain started when she picked up a box at work.

## 2021-02-04 NOTE — ED Provider Notes (Signed)
HPI  SUBJECTIVE:  Deanna Walker is a 40 y.o. female who presents with constant, waxing and waning, sharp, burning pain that goes down her left buttock and radiates down her posterior and lateral left leg.  She states it started 2 weeks ago when she tweaked her lower back while turning, holding a heavy box.  She does a lot of bending and lifting at work.  She was seen here on 7/13 initially for left low back pain, and was prescribed Naprosyn, Robaxin.  She states the Naprosyn, Robaxin helps.  She has also tried stretching, ice, icy hot, heating pad, movement all with improvement in her symptoms.  She also states she feels better with standing up.  Symptoms are worse for sitting down for prolonged period of time.  She states that the back pain has largely resolved, but she has residual pain in her buttock, sciatic distribution.  No numbness or tingling distally, leg weakness, saddle anesthesia, urinary or fecal incontinence, urinary retention, fevers, pain worse at night.  She has a past medical history of gestational diabetes, on metformin, PCOS, hypertension.  No history of osteoporosis.  LMP: Last month.  Denies possibility being pregnant.  PMD: [facility].    Past Medical History:  Diagnosis Date   Abnormal Pap smear 2011   colpo and biopsy done, normal paps since   Chronic urticaria    Depression    History of cardiac murmur as a child    infant   History of cervical incompetence    History of gestational diabetes    Infertility associated with anovulation 01/24/2013   PCOS (polycystic ovarian syndrome)    Perennial and seasonal allergic rhinoconjunctivitis 09/25/2015   Pre-diabetes    Pregnancy induced hypertension    07-19-2018 per pt watching diet ,  no meds    Past Surgical History:  Procedure Laterality Date   COLPOSCOPY     DILATION AND EVACUATION N/A 07/20/2018   Procedure: DILATATION AND EVACUATION;  Surgeon: Aletha Halim, MD;  Location: [facility];   Service: Gynecology;  Laterality: N/A;   WISDOM TOOTH EXTRACTION  2008  approx.    Family History  Problem Relation Age of Onset   Diabetes Maternal Aunt    Hypertension Maternal Aunt    Seizures Mother    Allergic rhinitis Neg Hx    Angioedema Neg Hx    Asthma Neg Hx    Atopy Neg Hx    Eczema Neg Hx    Immunodeficiency Neg Hx    Urticaria Neg Hx    Cystic fibrosis Neg Hx    Lupus Neg Hx    Emphysema Neg Hx    Migraines Neg Hx     Social History   Tobacco Use   Smoking status: Some Days    Years: 2.00    Types: Cigarettes   Smokeless tobacco: Never   Tobacco comments:    07-19-2017 prior to current pregnancy ,  occasional cigeratte  Vaping Use   Vaping Use: Never used  Substance Use Topics   Alcohol use: Not Currently    Alcohol/week: 0.0 standard drinks    Comment: occasionally    Drug use: Not Currently    No current facility-administered medications for this encounter.  Current Outpatient Medications:    ACCU-CHEK FASTCLIX LANCETS MISC, 1 Device by Percutaneous route 4 (four) times daily., Disp: 100 each, Rfl: 12   acetaminophen (TYLENOL) 500 MG tablet, Take 1 tablet (500 mg total) by mouth every 6 (six) hours as needed. (  Patient not taking: No sig reported), Disp: 30 tablet, Rfl: 0   Blood Glucose Monitoring Suppl (ACCU-CHEK GUIDE ME) w/Device KIT, 1 kit by Does not apply route 4 (four) times daily., Disp: 1 kit, Rfl: 0   cetirizine (ZYRTEC) 10 MG tablet, Take 10 mg by mouth daily as needed for allergies. , Disp: , Rfl: 0   dicyclomine (BENTYL) 20 MG tablet, Take 1 tablet (20 mg total) by mouth 2 (two) times daily., Disp: 20 tablet, Rfl: 0   DM-Phenylephrine-Acetaminophen 10-5-325 MG CAPS, Take 1 capsule by mouth daily as needed (for cold)., Disp: , Rfl:    erythromycin ophthalmic ointment, Place a 1/2 inch ribbon of ointment into the lower eyelid BID prn., Disp: 3.5 g, Rfl: 0   famotidine (PEPCID) 20 MG tablet, Take 1 tablet (20 mg total) by mouth 2 (two) times  daily., Disp: 30 tablet, Rfl: 0   glucose blood test strip, Use as instructed, Disp: 100 each, Rfl: 12   ibuprofen (ADVIL,MOTRIN) 600 MG tablet, Take 1 tablet (600 mg total) by mouth every 6 (six) hours as needed. (Patient not taking: No sig reported), Disp: 60 tablet, Rfl: 3   metFORMIN (GLUCOPHAGE) 500 MG tablet, Take 1 tablet (500 mg total) by mouth 2 (two) times daily with a meal., Disp: 60 tablet, Rfl: 12   methocarbamol (ROBAXIN) 500 MG tablet, Take 1 tablet (500 mg total) by mouth 2 (two) times daily., Disp: 20 tablet, Rfl: 0   naproxen (NAPROSYN) 500 MG tablet, Take 1 tablet (500 mg total) by mouth 2 (two) times daily., Disp: 30 tablet, Rfl: 0   ondansetron (ZOFRAN ODT) 4 MG disintegrating tablet, 4mg  ODT q4 hours prn nausea/vomit, Disp: 10 tablet, Rfl: 0   Phenyleph-Doxylamine-DM-APAP 5-6.25-10-325 MG/15ML LIQD, Take 15 mLs by mouth every 6 (six) hours as needed (for cold)., Disp: , Rfl:    prenatal vitamin w/FE, FA (PRENATAL 1 + 1) 27-1 MG TABS tablet, Take 1 tablet by mouth daily. , Disp: , Rfl:    vitamin E 100 UNIT capsule, Take 100 Units by mouth every Saturday. , Disp: , Rfl:   No Known Allergies   ROS  As noted in HPI.   Physical Exam  BP 133/76 (BP Location: Right Arm)   Pulse 85   Temp 98.7 F (37.1 C) (Oral)   Resp 18   SpO2 97%   Breastfeeding No   Constitutional: Well developed, well nourished, no acute distress Eyes:  EOMI, conjunctiva normal bilaterally HENT: Normocephalic, atraumatic,mucus membranes moist Respiratory: Normal inspiratory effort Cardiovascular: Normal rate GI: nondistended. No suprapubic tenderness skin: No rash, skin intact Musculoskeletal: Tenderness at the sciatic notch and along the left buttock.  Positive SLR left side.  Mild paralumbar tenderness.  No appreciable muscle spasm. No CVAT. No bony tenderness. Bilateral lower extremities nontender, baseline ROM with intact DP  pulses,  No pain with int/ext rotation flex/extension hips  bilaterally. Sensation baseline light touch bilaterally for Pt, DTR's symmetric and intact bilaterally KJ , Motor symmetric bilateral 5/5 hip flexion, quadriceps, hamstrings, EHL, foot dorsiflexion, foot plantarflexion, gait normal  Neurologic: Alert & oriented x 3, no focal neuro deficits Psychiatric: Speech and behavior appropriate   ED Course   Medications - No data to display  No orders of the defined types were placed in this encounter.   No results found for this or any previous visit (from the past 24 hour(s)). No results found.  ED Clinical Impression  1. Sciatica of left side     ED Assessment/Plan  Patient is status post back injury with resulting sciatica.  No red flags on H&P, deferring imaging today.  She states that she is getting better, but symptoms have not completely resolved.  Will send home with Naprosyn, Tylenol, Medrol Dosepak, discontinue Robaxin, start Zanaflex.  Continue gentle stretching.  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 she is estimated to return to work on August 5.   will have her follow-up with occupational health on August 4 so they can fill out the forms clearing her to go back to work.  Discussed MDM, treatment plan, and plan for follow-up with patient. patient agrees with plan.   No orders of the defined types were placed in this encounter.   *This clinic note was created using Dragon dictation software. Therefore, there may be occasional mistakes despite careful proofreading.  ?     Melynda Ripple, MD 02/06/21 [phone]

## 2021-02-04 NOTE — Discharge Instructions (Addendum)
Take the Naprosyn with 1000 mg of Tylenol twice a day.  May take an additional 1000 mg of Tylenol 2 more times a day as needed for pain.  Do not exceed 4000 mg of Tylenol from all sources in 24 hours.  Discontinue Robaxin.  Start Zanaflex for muscle spasms instead.  Continue gentle stretching, icy hot.  The Medrol Dosepak should help significantly with pain, swelling, irritation of the sciatic nerve.  Please follow-up with occupational health on 8/4 to have them fill out the appropriate forms to that you can go back to work safely

## 2022-05-14 ENCOUNTER — Ambulatory Visit (HOSPITAL_COMMUNITY)
Admission: EM | Admit: 2022-05-14 | Discharge: 2022-05-14 | Disposition: A | Discharge status: Home / Self Care | Payer: Medicaid Other | Attending: Emergency Medicine | Admitting: Emergency Medicine

## 2022-05-14 ENCOUNTER — Encounter (HOSPITAL_COMMUNITY): Payer: Self-pay

## 2022-05-14 DIAGNOSIS — B3731 Acute candidiasis of vulva and vagina: Secondary | ICD-10-CM | POA: Diagnosis present

## 2022-05-14 DIAGNOSIS — N898 Other specified noninflammatory disorders of vagina: Secondary | ICD-10-CM | POA: Insufficient documentation

## 2022-05-14 MED ORDER — FLUCONAZOLE 150 MG PO TABS: 150.0000 mg | ORAL_TABLET | Freq: Every day | ORAL | 0 refills | Status: AC

## 2022-05-14 MED ORDER — CLOTRIMAZOLE 1 % EX CREA: TOPICAL_CREAM | CUTANEOUS | 0 refills | Status: AC

## 2022-05-14 NOTE — ED Triage Notes (Signed)
Pt reports using some vaginal wipes on her vaginal lips. Pt reports bumps on her vaginal wall.

## 2022-05-14 NOTE — Discharge Instructions (Addendum)
We will call you if any of your test results are positive.    Diflucan has been sent to the pharmacy, you will start by taking 1 pill today and 1 pill 3 days from now.  Clotrimazole is a cream that you can apply to the affected area within the buttocks region, you can use this cream on the area 2 times daily until symptoms resolve.   Please follow-up with [place] or you GYN doctor if your symptoms do not improve.

## 2022-05-14 NOTE — ED Provider Notes (Signed)
Deanna Walker    CSN: [id number] Arrival date & time: 05/14/22  1627      History   Chief Complaint Chief Complaint  Patient presents with   Vaginal Itching    HPI Deanna Walker is a 41 y.o. female. Patient c/o vaginal irritation and vaginal discharge that started 3-4 days ago.  Patient reports onset of symptoms began after she bought some new vaginal wipes.  Patient reports irritation to the vulvar area.  Patient reports a "thick white" discharge .  Patient reports vaginal itching. Patient reports a rash to vulvar area.  Patient denies pain to vaginal and vulvar area.  Patient denies dysuria.  Patient denies any vaginal odor. patient has not taken any medications for symptoms.  Patient reports a history of prediabetes, patient states she follows up with her PCP.  Patient denies any history of HSV.    Vaginal Itching Pertinent negatives include no abdominal pain.    Past Medical History:  Diagnosis Date   Abnormal Pap smear 2011   colpo and biopsy done, normal paps since   Chronic urticaria    Depression    History of cardiac murmur as a child    infant   History of cervical incompetence    History of gestational diabetes    Infertility associated with anovulation 01/24/2013   PCOS (polycystic ovarian syndrome)    Perennial and seasonal allergic rhinoconjunctivitis 09/25/2015   Pre-diabetes    Pregnancy induced hypertension    07-19-2018 per pt watching diet ,  no meds    Patient Active Problem List   Diagnosis Date Noted   Missed abortion 07/19/2018   Gestational diabetes mellitus (GDM) affecting pregnancy, antepartum 06/30/2018   History of gestational diabetes in prior pregnancy, currently pregnant 06/20/2018   Supervision of high risk pregnancy, antepartum 06/20/2018   History of incompetent cervix, currently pregnant 06/20/2018   AMA (advanced maternal age) primigravida 35+ 06/20/2018   Chronic hypertension 09/17/2017   Chronic urticaria 09/25/2015    Prediabetes 11/12/2013   PCOS (polycystic ovarian syndrome) 02/11/2011    Past Surgical History:  Procedure Laterality Date   COLPOSCOPY     DILATION AND EVACUATION N/A 07/20/2018   Procedure: DILATATION AND EVACUATION;  Surgeon: Aletha Halim, MD;  Location: [facility];  Service: Gynecology;  Laterality: N/A;   WISDOM TOOTH EXTRACTION  2008  approx.    OB History     Gravida  3   Para  1   Term      Preterm  1   AB  1   Living  0      SAB      IAB      Ectopic  1   Multiple      Live Births  1            Home Medications    Prior to Admission medications   Medication Sig Start Date End Date Taking? Authorizing Provider  clotrimazole (LOTRIMIN) 1 % cream Apply to affected area 2 times daily 05/14/22  Yes Wynn Alldredge, Fidela Juneau, NP  fluconazole (DIFLUCAN) 150 MG tablet Take 1 tablet (150 mg total) by mouth daily. Take 1 tablet today, and 1 tablet 3 days from initiation. 05/14/22  Yes Flossie Dibble, NP  ACCU-CHEK FASTCLIX LANCETS MISC 1 Device by Percutaneous route 4 (four) times daily. 06/30/18   Constant, [name], MD  acetaminophen (TYLENOL) 500 MG tablet Take 1 tablet (500 mg total) by mouth every 6 (six) hours as needed.  Patient not taking: No sig reported 07/20/18   Aletha Halim, MD  Blood Glucose Monitoring Suppl (ACCU-CHEK GUIDE ME) w/Device KIT 1 kit by Does not apply route 4 (four) times daily. 06/30/18   Constant, [name], MD  cetirizine (ZYRTEC) 10 MG tablet Take 10 mg by mouth daily as needed for allergies.  08/30/16   [provider]  dicyclomine (BENTYL) 20 MG tablet Take 1 tablet (20 mg total) by mouth 2 (two) times daily. 08/26/18   Jacqlyn Larsen, PA-C  DM-Phenylephrine-Acetaminophen 10-5-325 MG CAPS Take 1 capsule by mouth daily as needed (for cold).    [provider]  erythromycin ophthalmic ointment Place a 1/2 inch ribbon of ointment into the lower eyelid BID prn. 07/20/20   Volney American, PA-C   famotidine (PEPCID) 20 MG tablet Take 1 tablet (20 mg total) by mouth 2 (two) times daily. 08/26/18   Jacqlyn Larsen, PA-C  glucose blood test strip Use as instructed 06/30/18   Constant, Vickii Chafe, MD  metFORMIN (GLUCOPHAGE) 500 MG tablet Take 1 tablet (500 mg total) by mouth 2 (two) times daily with a meal. 10/08/16   Denney, Rachelle A, CNM  methylPREDNISolone (MEDROL DOSEPAK) 4 MG TBPK tablet Take by mouth daily. Follow package instructions 02/04/21   Melynda Ripple, MD  naproxen (NAPROSYN) 500 MG tablet Take 1 tablet (500 mg total) by mouth 2 (two) times daily. 02/04/21   Melynda Ripple, MD  prenatal vitamin w/FE, FA (PRENATAL 1 + 1) 27-1 MG TABS tablet Take 1 tablet by mouth daily.     [provider]  tiZANidine (ZANAFLEX) 4 MG tablet Take 1 tablet (4 mg total) by mouth every 8 (eight) hours as needed for muscle spasms. 02/04/21   Melynda Ripple, MD  vitamin E 100 UNIT capsule Take 100 Units by mouth every Saturday.     [provider]    Family History Family History  Problem Relation Age of Onset   Diabetes Maternal Aunt    Hypertension Maternal Aunt    Seizures Mother    Allergic rhinitis Neg Hx    Angioedema Neg Hx    Asthma Neg Hx    Atopy Neg Hx    Eczema Neg Hx    Immunodeficiency Neg Hx    Urticaria Neg Hx    Cystic fibrosis Neg Hx    Lupus Neg Hx    Emphysema Neg Hx    Migraines Neg Hx     Social History Social History   Tobacco Use   Smoking status: Some Days    Years: 2.00    Types: Cigarettes   Smokeless tobacco: Never   Tobacco comments:    07-19-2017 prior to current pregnancy ,  occasional cigeratte  Vaping Use   Vaping Use: Never used  Substance Use Topics   Alcohol use: Not Currently    Alcohol/week: 0.0 standard drinks of alcohol    Comment: occasionally    Drug use: Not Currently     Allergies   Patient has no known allergies.   Review of Systems Review of Systems  Constitutional:  Negative for activity change, appetite  change and chills.  Gastrointestinal:  Negative for abdominal pain, nausea and vomiting.  Endocrine: Negative for polydipsia, polyphagia and polyuria.  Genitourinary:  Positive for vaginal discharge. Negative for decreased urine volume, difficulty urinating, dyspareunia, dysuria, flank pain, frequency, genital sores, hematuria, menstrual problem, pelvic pain, urgency, vaginal bleeding and vaginal pain.  Skin:  Positive for rash.     Physical Exam  Triage Vital Signs ED Triage Vitals [05/14/22 1730]  Enc Vitals Group     BP (!) 160/105     Pulse Rate 89     Resp 18     Temp 98.4 F (36.9 C)     Temp Source Oral     SpO2 99 %     Weight      Height      Head Circumference      Peak Flow      Pain Score      Pain Loc      Pain Edu?      Excl. in [place]?    No data found.  Updated Vital Signs BP (!) 160/105 (BP Location: Left Arm)   Pulse 89   Temp 98.4 F (36.9 C) (Oral)   Resp 18   LMP 05/06/2022   SpO2 99%      Physical Exam Vitals and nursing note reviewed. Exam conducted with a chaperone present [facility] CMA).  Constitutional:      Appearance: Normal appearance.  Genitourinary:    Exam position: Lithotomy position.     Pubic Area: No rash.      Labia:        Right: No rash, tenderness, lesion or injury.        Left: No rash, tenderness, lesion or injury.      Urethra: No prolapse, urethral pain, urethral swelling or urethral lesion.     Vagina: No signs of injury. Vaginal discharge present. No erythema, tenderness, bleeding, lesions or prolapsed vaginal walls.     Cervix: Discharge present. No friability, lesion, erythema or cervical bleeding.     Comments: White thick discharge present during exam, no abnormal odor noted  Skin:    General: Skin is moist.     Findings: Erythema present.          Comments: Erythematous moist homogenous patch within both sides of gluteal fold, no pain upon palpation.  Noted during pelvic exam.    Neurological:     Mental Status:  She is alert.      UC Treatments / Results  Labs (all labs ordered are listed, but only abnormal results are displayed) Labs Reviewed  CERVICOVAGINAL ANCILLARY ONLY    EKG   Radiology No results found.  Procedures Procedures (including critical care time)  Medications Ordered in UC Medications - No data to display  Initial Impression / Assessment and Plan / UC Course  I have reviewed the triage vital signs and the nursing notes.  Pertinent labs & imaging results that were available during my care of the patient were reviewed by me and considered in my medical decision making (see chart for details).     Patient was evaluated for vaginal discharge and yeast infection. Diflucan and clotrimazole was prescribed.  Clotrimazole prescribed for gluteal fold yeast infection.  Patient made aware of medication regiment and possible side effects.  Vaginal swab is pending. Patient made aware of results reporting protocol and MyChart.  Patient was educated on causes for yeast infection , she was made aware to discontinue use of the wipes.  She was made aware to follow-up with PCP for basic lab work and annual wellness exam . Patient verbalized understanding of instructions.  Final Clinical Impressions(s) / UC Diagnoses   Final diagnoses:  Vaginal discharge  Yeast infection involving the vagina and surrounding area     Discharge Instructions      We will call you if any of your test  results are positive.    Diflucan has been sent to the pharmacy, you will start by taking 1 pill today and 1 pill 3 days from now.  Clotrimazole is a cream that you can apply to the affected area within the buttocks region, you can use this cream on the area 2 times daily until symptoms resolve.   Please follow-up with [place] or you GYN doctor if your symptoms do not improve.      ED Prescriptions     Medication Sig Dispense Auth. Provider   fluconazole (DIFLUCAN) 150 MG tablet Take 1 tablet (150 mg  total) by mouth daily. Take 1 tablet today, and 1 tablet 3 days from initiation. 2 tablet Flossie Dibble, NP   clotrimazole (LOTRIMIN) 1 % cream Apply to affected area 2 times daily 15 g Flossie Dibble, NP      PDMP not reviewed this encounter.   Flossie Dibble, NP 05/14/22 1915

## 2022-05-15 LAB — CERVICOVAGINAL ANCILLARY ONLY
Bacterial Vaginitis (gardnerella): NEGATIVE
Candida Glabrata: NEGATIVE
Candida Vaginitis: POSITIVE — AB
Chlamydia: NEGATIVE
Comment: NEGATIVE
Comment: NEGATIVE
Comment: NEGATIVE
Comment: NEGATIVE
Comment: NEGATIVE
Comment: NORMAL
Neisseria Gonorrhea: NEGATIVE
Trichomonas: NEGATIVE
